# Patient Record
Sex: Female | Born: 1960 | Race: Black or African American | Hispanic: No | Marital: Married | Smoking: Never smoker
Health system: Southern US, Community
[De-identification: ages and names within clinical notes are randomized; demographics above are authoritative.]

## PROBLEM LIST (undated history)

## (undated) DIAGNOSIS — Z9889 Other specified postprocedural states: Secondary | ICD-10-CM

## (undated) DIAGNOSIS — I10 Essential (primary) hypertension: Secondary | ICD-10-CM

## (undated) DIAGNOSIS — C539 Malignant neoplasm of cervix uteri, unspecified: Secondary | ICD-10-CM

## (undated) DIAGNOSIS — Z803 Family history of malignant neoplasm of breast: Secondary | ICD-10-CM

## (undated) HISTORY — PX: CHOLECYSTECTOMY: SHX55

## (undated) HISTORY — DX: Malignant neoplasm of cervix uteri, unspecified: C53.9

## (undated) HISTORY — DX: Family history of malignant neoplasm of breast: Z80.3

---

## 2009-04-25 DIAGNOSIS — I10 Essential (primary) hypertension: Secondary | ICD-10-CM | POA: Insufficient documentation

## 2009-05-03 DIAGNOSIS — K219 Gastro-esophageal reflux disease without esophagitis: Secondary | ICD-10-CM | POA: Insufficient documentation

## 2015-11-03 ENCOUNTER — Other Ambulatory Visit (HOSPITAL_COMMUNITY)
Admission: RE | Admit: 2015-11-03 | Discharge: 2015-11-03 | Disposition: A | Discharge status: Home / Self Care | Payer: BLUE CROSS/BLUE SHIELD | Source: Ambulatory Visit | Attending: Family Medicine | Admitting: Family Medicine

## 2015-11-03 ENCOUNTER — Other Ambulatory Visit: Payer: Self-pay | Admitting: Family Medicine

## 2015-11-03 DIAGNOSIS — Z01419 Encounter for gynecological examination (general) (routine) without abnormal findings: Secondary | ICD-10-CM | POA: Insufficient documentation

## 2015-11-06 LAB — CYTOLOGY - PAP: Diagnosis: NEGATIVE

## 2016-10-24 ENCOUNTER — Ambulatory Visit (INDEPENDENT_AMBULATORY_CARE_PROVIDER_SITE_OTHER): Payer: BLUE CROSS/BLUE SHIELD

## 2016-10-24 ENCOUNTER — Ambulatory Visit (INDEPENDENT_AMBULATORY_CARE_PROVIDER_SITE_OTHER): Payer: BLUE CROSS/BLUE SHIELD | Admitting: Orthopaedic Surgery

## 2016-10-24 DIAGNOSIS — G8929 Other chronic pain: Secondary | ICD-10-CM

## 2016-10-24 DIAGNOSIS — M25561 Pain in right knee: Secondary | ICD-10-CM

## 2016-10-24 MED ORDER — DICLOFENAC SODIUM 1 % TD GEL: 2.0000 g | Freq: Four times a day (QID) | TRANSDERMAL | 5 refills | Status: DC

## 2016-10-24 NOTE — Progress Notes (Signed)
   Office Visit Note   Patient: Jasmine Maddox           Date of Birth: [birth year]           MRN: [id number] Visit Date: 10/24/2016              Requested by: [name], [address], [place] [id number] PCP: No primary care provider on file.   Assessment & Plan: Visit Diagnoses:  1. 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 Prescription for Voltaren gel. Oral NSAIDs as needed. Avoid deep squats and lunges. Follow-up as needed.  Follow-Up Instructions: Return if symptoms worsen or fail to improve.   Orders:  Orders Placed This Encounter  Procedures  . XR KNEE 3 VIEW RIGHT   Meds ordered this encounter  Medications  . diclofenac sodium (VOLTAREN) 1 % GEL    Sig: Apply 2 g topically 4 (four) times daily.    Dispense:  1 Tube    Refill:  5      Procedures: No procedures performed   Clinical Data: No additional findings.   Subjective: Chief Complaint  Patient presents with  . Right Knee - Pain    Patient is a 56 year old female comes in with right knee pain for a couple years. She is very active and used to be a Museum/gallery exhibitions officer. Her pain is on the lateral aspect of the patellar facet. This is worse with flexion of the knee and with deep squats. Denies any swelling or numbness and tingling. Denies any injuries.    Review of Systems  Constitutional: Negative.   HENT: Negative.   Eyes: Negative.   Respiratory: Negative.   Cardiovascular: Negative.   Endocrine: Negative.   Musculoskeletal: Negative.   Neurological: Negative.   Hematological: Negative.   Psychiatric/Behavioral: Negative.   All other systems reviewed and are negative.    Objective: Vital Signs: There were no vitals taken for this visit.  Physical Exam  Constitutional: She is oriented to person, place, and time. She appears well-developed and well-nourished.  HENT:  Head: Normocephalic and atraumatic.   Eyes: EOM are normal.  Neck: Neck supple.  Pulmonary/Chest: Effort normal.  Abdominal: Soft.  Neurological: She is alert and oriented to person, place, and time.  Skin: Skin is warm. Capillary refill takes less than 2 seconds.  Psychiatric: She has a normal mood and affect. Her behavior is normal. Judgment and thought content normal.  Nursing note and vitals reviewed.   Ortho Exam Right knee exam shows no joint effusion. Patellar tracking is normal. Collaterals and cruciates are stable. Patella mobility is normal. Specialty Comments:  No specialty comments available.  Imaging: Xr Knee 3 View Right  Result Date: 10/24/2016 No significant degenerative joint disease or structural abnormalities    PMFS History: There are no active problems to display for this patient.  No past medical history on file.  No family history on file.  No past surgical history on file. Social History   Occupational History  . Not on file.   Social History Main Topics  . Smoking status: Not on file  . Smokeless tobacco: Not on file  . Alcohol use Not on file  . Drug use: Unknown  . Sexual activity: Not on file

## 2018-01-17 ENCOUNTER — Encounter: Payer: Self-pay | Admitting: Emergency Medicine

## 2018-01-17 ENCOUNTER — Ambulatory Visit
Admission: EM | Admit: 2018-01-17 | Discharge: 2018-01-17 | Disposition: A | Discharge status: Home / Self Care | Payer: BLUE CROSS/BLUE SHIELD | Attending: Family Medicine | Admitting: Family Medicine

## 2018-01-17 ENCOUNTER — Other Ambulatory Visit: Payer: Self-pay

## 2018-01-17 DIAGNOSIS — B9789 Other viral agents as the cause of diseases classified elsewhere: Secondary | ICD-10-CM | POA: Diagnosis not present

## 2018-01-17 DIAGNOSIS — J988 Other specified respiratory disorders: Secondary | ICD-10-CM | POA: Diagnosis not present

## 2018-01-17 HISTORY — DX: Essential (primary) hypertension: I10

## 2018-01-17 LAB — RAPID STREP SCREEN (MED CTR MEBANE ONLY): Streptococcus, Group A Screen (Direct): NEGATIVE

## 2018-01-17 MED ORDER — PREDNISONE 50 MG PO TABS: ORAL_TABLET | ORAL | 0 refills | Status: DC

## 2018-01-17 MED ORDER — LIDOCAINE VISCOUS HCL 2 % MT SOLN: OROMUCOSAL | 0 refills | Status: DC

## 2018-01-17 MED ORDER — BENZONATATE 100 MG PO CAPS: 100.0000 mg | ORAL_CAPSULE | Freq: Three times a day (TID) | ORAL | 0 refills | Status: DC | PRN

## 2018-01-17 MED ORDER — HYDROCODONE-HOMATROPINE 5-1.5 MG/5ML PO SYRP: 5.0000 mL | ORAL_SOLUTION | Freq: Four times a day (QID) | ORAL | 0 refills | Status: DC | PRN

## 2018-01-17 NOTE — ED Provider Notes (Signed)
[facility]    CSN: [id number] Arrival date & time: 01/17/18  1122  History   Chief Complaint Chief Complaint  Patient presents with  . Sore Throat  . Cough   HPI  57 year old female presents with cough and sore throat.  4 to 5-day history of cough, sore throat.  Has had a mildly elevated temperature of 100.2.  This occurred on Thursday.  No elevated temperatures at that time.  She has taken Mucinex and Advil without resolution.  Reports that her daughter has been sick and she believes that she contracted it from her.  No known exacerbating factors.  No other associated symptoms.  No other complaints.  History reviewed and updated as below.  Past Medical History:  Diagnosis Date  . Hypertension    Past Surgical History:  Procedure Laterality Date  . CHOLECYSTECTOMY      OB History   No obstetric history on file.    Home Medications    Prior to Admission medications   Medication Sig Start Date End Date Taking? Authorizing Provider  lisinopril (PRINIVIL,ZESTRIL) 20 MG tablet Take 20 mg by mouth daily.   Yes [provider]  benzonatate (TESSALON) 100 MG capsule Take 1 capsule (100 mg total) by mouth 3 (three) times daily as needed. 01/17/18   Coral Spikes, DO  lidocaine (XYLOCAINE) 2 % solution Gargle 15 mL every 3 hours as needed. May swallow if desired. 01/17/18   Coral Spikes, DO   Social History Social History   Tobacco Use  . Smoking status: Never Smoker  . Smokeless tobacco: Never Used  Substance Use Topics  . Alcohol use: Yes    Comment: socially  . Drug use: Never     Allergies   Patient has no known allergies.   Review of Systems Review of Systems  Constitutional: Positive for fever.  HENT: Positive for sore throat.   Respiratory: Positive for cough.    Physical Exam Triage Vital Signs ED Triage Vitals  Enc Vitals Group     BP 01/17/18 1131 (!) 157/98     Pulse Rate 01/17/18 1131 75     Resp 01/17/18 1131 18   Temp 01/17/18 1131 98.4 F (36.9 C)     Temp Source 01/17/18 1131 Oral     SpO2 01/17/18 1131 99 %     Weight 01/17/18 1132 143 lb (64.9 kg)     Height 01/17/18 1132 5\' 4"  (1.626 m)     Head Circumference --      Peak Flow --      Pain Score 01/17/18 1131 6     Pain Loc --      Pain Edu? --      Excl. in [place]? --    Updated Vital Signs BP (!) 157/98 (BP Location: Left Arm)   Pulse 75   Temp 98.4 F (36.9 C) (Oral)   Resp 18   Ht 5\' 4"  (1.626 m)   Wt 64.9 kg   SpO2 99%   BMI 24.55 kg/m   Visual Acuity Right Eye Distance:   Left Eye Distance:   Bilateral Distance:    Right Eye Near:   Left Eye Near:    Bilateral Near:     Physical Exam Vitals signs and nursing note reviewed.  Constitutional:      General: She is not in acute distress. HENT:     Head: Normocephalic and atraumatic.     Mouth/Throat:     Comments: Mild Oropharyngeal erythema.  Cardiovascular:     Rate and Rhythm: Normal rate and regular rhythm.  Pulmonary:     Effort: Pulmonary effort is normal.     Breath sounds: No wheezing, rhonchi or rales.  Neurological:     Mental Status: She is alert.  Psychiatric:        Mood and Affect: Mood normal.        Behavior: Behavior normal.    UC Treatments / Results  Labs (all labs ordered are listed, but only abnormal results are displayed) Labs Reviewed  RAPID STREP SCREEN ([facility] ONLY)  CULTURE, GROUP A STREP [facility])    EKG None  Radiology No results found.  Procedures Procedures (including critical care time)  Medications Ordered in UC Medications - No data to display  Initial Impression / Assessment and Plan / UC Course  I have reviewed the triage vital signs and the nursing notes.  Pertinent labs & imaging results that were available during my care of the patient were reviewed by me and considered in my medical decision making (see chart for details).    57 year old female presents with viral respiratory infection.  Treating with  Tessalon Perles and viscous lidocaine.  Final Clinical Impressions(s) / UC Diagnoses   Final diagnoses:  Viral respiratory infection     Discharge Instructions     Medication as prescribed.  Take care  Dr. Lacinda Axon    ED Prescriptions    Medication Sig Dispense Auth. Provider   benzonatate (TESSALON) 100 MG capsule Take 1 capsule (100 mg total) by mouth 3 (three) times daily as needed. 30 capsule Chandra Asher G, DO   lidocaine (XYLOCAINE) 2 % solution Gargle 15 mL every 3 hours as needed. May swallow if desired. 200 mL Drayke Grabel G, DO   predniSONE (DELTASONE) 50 MG tablet  (Status: Discontinued) 1 tablet daily x 5 days 5 tablet Ruhan Borak G, DO   HYDROcodone-homatropine (HYCODAN) 5-1.5 MG/5ML syrup  (Status: Discontinued) Take 5 mLs by mouth every 6 (six) hours as needed. 120 mL Coral Spikes, DO     Controlled Substance Prescriptions [place] Controlled Substance Registry consulted? Not Applicable   Coral Spikes, DO 01/17/18 3335

## 2018-01-17 NOTE — Discharge Instructions (Signed)
Medication as prescribed.  Take care  Dr. Raffaella Edison  

## 2018-01-17 NOTE — ED Triage Notes (Signed)
Patient c/o cough and sore throat that started 4 days ago. Patient reports fever of 100.2 on Thursday but no fever since. Patient has taken Mucinex DM and Advil for her symptoms.

## 2018-01-20 LAB — CULTURE, GROUP A STREP (THRC)

## 2018-02-09 ENCOUNTER — Other Ambulatory Visit: Payer: Self-pay | Admitting: Radiology

## 2018-02-11 ENCOUNTER — Telehealth: Payer: Self-pay | Admitting: Hematology and Oncology

## 2018-02-11 NOTE — Telephone Encounter (Signed)
Spoke with patient to confirm afternoon [facility] appointment for 1/29, [facility] patient appointment reminder sent

## 2018-02-12 ENCOUNTER — Encounter: Payer: Self-pay | Admitting: *Deleted

## 2018-02-12 DIAGNOSIS — D0511 Intraductal carcinoma in situ of right breast: Secondary | ICD-10-CM | POA: Insufficient documentation

## 2018-02-17 NOTE — Progress Notes (Signed)
[place] CONSULT NOTE  Patient Care Team: [name], PA as PCP - General (Family Medicine) [name], MD as Consulting Physician (General Surgery) [name], MD as Consulting Physician (Hematology and Oncology) [name], MD as Consulting Physician (Radiation Oncology)  CHIEF COMPLAINTS/PURPOSE OF CONSULTATION: Newly diagnosed breast cancer  HISTORY OF PRESENTING ILLNESS:  Jasmine Maddox 58 y.o. female is here because of recent diagnosis of DCIS of the right breast. The cancer was detected on a routine screening mammogram on 01/26/18 that showed a 0.4cm cluster of calcifications in the right breast. The cancer was not palpable prior to diagnosis. A diagnostic mammogram from 01/30/18 showed the 0.4cm cluster at the 12 o'clock position 4cm from the nipple. 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   She presents to the clinic today with her husband. She has a family history of breast cancer in her mother at 35 and sister at 48 and she reports her sister had negative genetic testing.   I reviewed her records extensively and collaborated the history with the patient.  SUMMARY OF ONCOLOGIC HISTORY:   Ductal carcinoma in situ (DCIS) of right breast   01/30/2018 Mammogram    Diagnostic Mammogram  0.4cm cluster of calcifications, 12 o'clock position, 4cm fn, right breast    02/12/2018 Initial Diagnosis    Screening detected right breast calcifications 4 mm size UOQ 11:30 position stereotactic biopsy revealed high-grade DCIS ER 90%, PR 10%, Tis NX stage 0    MEDICAL HISTORY:  Past Medical History:  Diagnosis Date  . Cervical cancer ([place])   . Hypertension    SURGICAL HISTORY: Past Surgical History:  Procedure Laterality Date  . CHOLECYSTECTOMY      SOCIAL HISTORY: Social History   Socioeconomic History  . Marital status: Unknown    Spouse name: Not on file  . Number of  children: Not on file  . Years of education: Not on file  . Highest education level: Not on file  Occupational History  . Not on file  Social Needs  . Financial resource strain: Not on file  . Food insecurity:    Worry: Not on file    Inability: Not on file  . Transportation needs:    Medical: Not on file    Non-medical: Not on file  Tobacco Use  . Smoking status: Never Smoker  . Smokeless tobacco: Never Used  Substance and Sexual Activity  . Alcohol use: Yes    Comment: socially  . Drug use: Never  . Sexual activity: Not on file  Lifestyle  . Physical activity:    Days per week: Not on file    Minutes per session: Not on file  . Stress: Not on file  Relationships  . Social connections:    Talks on phone: Not on file    Gets together: Not on file    Attends religious service: Not on file    Active member of club or organization: Not on file    Attends meetings of clubs or organizations: Not on file    Relationship status: Not on file  . Intimate partner violence:    Fear of current or ex partner: Not on file    Emotionally abused: Not on file    Physically abused: Not on file    Forced sexual activity: Not on file  Other Topics Concern  . Not on file  Social History Narrative  .  Not on file    FAMILY HISTORY: Family History  Problem Relation Age of Onset  . Breast cancer Mother   . Breast cancer Sister     ALLERGIES:  has No Known Allergies.  MEDICATIONS:  Current Outpatient Medications  Medication Sig Dispense Refill  . benzonatate (TESSALON) 100 MG capsule Take 1 capsule (100 mg total) by mouth 3 (three) times daily as needed. 30 capsule 0  . lidocaine (XYLOCAINE) 2 % solution Gargle 15 mL every 3 hours as needed. May swallow if desired. 200 mL 0  . lisinopril (PRINIVIL,ZESTRIL) 20 MG tablet Take 20 mg by mouth daily.     No current facility-administered medications for this visit.     REVIEW OF SYSTEMS:   Constitutional: Denies fevers, chills or  abnormal night sweats Eyes: Denies blurriness of vision, double vision or watery eyes Ears, nose, mouth, throat, and face: Denies mucositis or sore throat Respiratory: Denies cough, dyspnea or wheezes Cardiovascular: Denies palpitation, chest discomfort or lower extremity swelling Gastrointestinal:  Denies nausea, heartburn or change in bowel habits Skin: Denies abnormal skin rashes Lymphatics: Denies new lymphadenopathy or easy bruising Neurological:Denies numbness, tingling or new weaknesses Behavioral/Psych: Mood is stable, no new changes  Breast: Denies any palpable lumps or discharge All other systems were reviewed with the patient and are negative.  PHYSICAL EXAMINATION: ECOG PERFORMANCE STATUS: 0 - Asymptomatic  Vitals:   02/18/18 1321  BP: (!) 154/87  Pulse: (!) 56  Resp: 18  Temp: 98.6 F (37 C)  SpO2: 100%   Filed Weights   02/18/18 1321  Weight: 137 lb (62.1 kg)    GENERAL:alert, no distress and comfortable SKIN: skin color, texture, turgor are normal, no rashes or significant lesions EYES: normal, conjunctiva are pink and non-injected, sclera clear OROPHARYNX:no exudate, no erythema and lips, buccal mucosa, and tongue normal  NECK: supple, thyroid normal size, non-tender, without nodularity LYMPH:  no palpable lymphadenopathy in the cervical, axillary or inguinal LUNGS: clear to auscultation and percussion with normal breathing effort HEART: regular rate & rhythm and no murmurs and no lower extremity edema ABDOMEN:abdomen soft, non-tender and normal bowel sounds Musculoskeletal:no cyanosis of digits and no clubbing  PSYCH: alert & oriented x 3 with fluent speech NEURO: no focal motor/sensory deficits  BREAST: No palpable nodules in breast. No palpable axillary or supraclavicular lymphadenopathy (exam performed in the presence of a chaperone)   LABORATORY DATA:  I have reviewed the data as listed Lab Results  Component Value Date   WBC 4.7 02/18/2018   HGB  12.5 02/18/2018   HCT 37.2 02/18/2018   MCV 88.6 02/18/2018   PLT 183 02/18/2018   Lab Results  Component Value Date   NA 140 02/18/2018   K 4.0 02/18/2018   CL 106 02/18/2018   CO2 27 02/18/2018    RADIOGRAPHIC STUDIES: I have personally reviewed the radiological reports and agreed with the findings in the report.  ASSESSMENT AND PLAN:  Ductal carcinoma in situ (DCIS) of right breast 02/09/2018:Screening detected right breast calcifications 4 mm size UOQ 11:30 position stereotactic biopsy revealed high-grade DCIS ER 90%, PR 10%, Tis NX stage 0  Pathology review: I discussed with the patient the difference between DCIS and invasive breast cancer. It is considered a precancerous lesion. DCIS is classified as a 0. It is generally detected through mammograms as calcifications. We discussed the significance of grades and its impact on prognosis. We also discussed the importance of ER and PR receptors and their implications to adjuvant treatment  options. Prognosis of DCIS dependence on grade, comedo necrosis. It is anticipated that if not treated, 20-30% of DCIS can develop into invasive breast cancer.  Recommendation: 1. Breast conserving surgery 2. Followed by adjuvant radiation therapy 3. Followed by antiestrogen therapy with tamoxifen 5 years  Tamoxifen counseling: We discussed the risks and benefits of tamoxifen. These include but not limited to insomnia, hot flashes, mood changes, vaginal dryness, and weight gain. Although rare, serious side effects including endometrial cancer, risk of blood clots were also discussed. We strongly believe that the benefits far outweigh the risks. Patient understands these risks and consented to starting treatment. Planned treatment duration is 5 years.  Return to clinic after surgery to discuss the final pathology report and come up with an adjuvant treatment plan.    All questions were answered. The patient knows to call the clinic with any  problems, questions or concerns.   [name], MD 02/18/2018   I, Cloyde Reams Dorshimer, am acting as scribe for [name], MD.  I have reviewed the above documentation for accuracy and completeness, and I agree with the above.

## 2018-02-18 ENCOUNTER — Ambulatory Visit (HOSPITAL_BASED_OUTPATIENT_CLINIC_OR_DEPARTMENT_OTHER): Payer: BLUE CROSS/BLUE SHIELD | Admitting: Licensed Clinical Social Worker

## 2018-02-18 ENCOUNTER — Encounter: Payer: Self-pay | Admitting: Hematology and Oncology

## 2018-02-18 ENCOUNTER — Ambulatory Visit: Payer: BLUE CROSS/BLUE SHIELD | Admitting: Physical Therapy

## 2018-02-18 ENCOUNTER — Ambulatory Visit
Admission: RE | Admit: 2018-02-18 | Discharge: 2018-02-18 | Disposition: A | Discharge status: Home / Self Care | Payer: BLUE CROSS/BLUE SHIELD | Source: Ambulatory Visit | Attending: Radiation Oncology | Admitting: Radiation Oncology

## 2018-02-18 ENCOUNTER — Inpatient Hospital Stay: Payer: BLUE CROSS/BLUE SHIELD

## 2018-02-18 ENCOUNTER — Encounter: Payer: Self-pay | Admitting: Licensed Clinical Social Worker

## 2018-02-18 ENCOUNTER — Inpatient Hospital Stay: Payer: BLUE CROSS/BLUE SHIELD | Attending: Hematology and Oncology | Admitting: Hematology and Oncology

## 2018-02-18 DIAGNOSIS — Z803 Family history of malignant neoplasm of breast: Secondary | ICD-10-CM

## 2018-02-18 DIAGNOSIS — D0511 Intraductal carcinoma in situ of right breast: Secondary | ICD-10-CM | POA: Diagnosis present

## 2018-02-18 LAB — CMP (CANCER CENTER ONLY)
ALT: 32 U/L (ref 0–44)
AST: 28 U/L (ref 15–41)
Albumin: 4 g/dL (ref 3.5–5.0)
Alkaline Phosphatase: 64 U/L (ref 38–126)
Anion gap: 7 (ref 5–15)
BUN: 10 mg/dL (ref 6–20)
CO2: 27 mmol/L (ref 22–32)
Calcium: 9.5 mg/dL (ref 8.9–10.3)
Chloride: 106 mmol/L (ref 98–111)
Creatinine: 0.77 mg/dL (ref 0.44–1.00)
GFR, Est AFR Am: >60 mL/min (ref >60)
GFR, Estimated: >60 mL/min (ref >60)
Glucose, Bld: 94 mg/dL (ref 70–99)
Potassium: 4 mmol/L (ref 3.5–5.1)
SODIUM: 140 mmol/L (ref 135–145)
Total Bilirubin: 0.4 mg/dL (ref 0.3–1.2)
Total Protein: 7.7 g/dL (ref 6.5–8.1)

## 2018-02-18 LAB — CBC WITH DIFFERENTIAL (CANCER CENTER ONLY)
Abs Immature Granulocytes: 0 10*3/uL (ref 0.00–0.07)
Basophils Absolute: 0 10*3/uL (ref 0.0–0.1)
Basophils Relative: 1 %
Eosinophils Absolute: 0.1 10*3/uL (ref 0.0–0.5)
Eosinophils Relative: 3 %
HCT: 37.2 % (ref 36.0–46.0)
Hemoglobin: 12.5 g/dL (ref 12.0–15.0)
Immature Granulocytes: 0 %
Lymphocytes Relative: 48 %
Lymphs Abs: 2.3 10*3/uL (ref 0.7–4.0)
MCH: 29.8 pg (ref 26.0–34.0)
MCHC: 33.6 g/dL (ref 30.0–36.0)
MCV: 88.6 fL (ref 80.0–100.0)
Monocytes Absolute: 0.3 10*3/uL (ref 0.1–1.0)
Monocytes Relative: 6 %
Neutro Abs: 2 10*3/uL (ref 1.7–7.7)
Neutrophils Relative %: 42 %
Platelet Count: 183 10*3/uL (ref 150–400)
RBC: 4.2 MIL/uL (ref 3.87–5.11)
RDW: 11.9 % (ref 11.5–15.5)
WBC Count: 4.7 10*3/uL (ref 4.0–10.5)
nRBC: 0 % (ref 0.0–0.2)

## 2018-02-18 NOTE — Assessment & Plan Note (Signed)
02/09/2018:Screening detected right breast calcifications 4 mm size UOQ 11:30 position stereotactic biopsy revealed high-grade DCIS ER 90%, PR 10%, Tis NX stage 0  Pathology review: I discussed with the patient the difference between DCIS and invasive breast cancer. It is considered a precancerous lesion. DCIS is classified as a 0. It is generally detected through mammograms as calcifications. We discussed the significance of grades and its impact on prognosis. We also discussed the importance of ER and PR receptors and their implications to adjuvant treatment options. Prognosis of DCIS dependence on grade, comedo necrosis. It is anticipated that if not treated, 20-30% of DCIS can develop into invasive breast cancer.  Recommendation: 1. Breast conserving surgery 2. Followed by adjuvant radiation therapy 3. Followed by antiestrogen therapy with tamoxifen 5 years  Tamoxifen counseling: We discussed the risks and benefits of tamoxifen. These include but not limited to insomnia, hot flashes, mood changes, vaginal dryness, and weight gain. Although rare, serious side effects including endometrial cancer, risk of blood clots were also discussed. We strongly believe that the benefits far outweigh the risks. Patient understands these risks and consented to starting treatment. Planned treatment duration is 5 years.  Return to clinic after surgery to discuss the final pathology report and come up with an adjuvant treatment plan.

## 2018-02-18 NOTE — Progress Notes (Signed)
Radiation Oncology         ([phone]) [phone] ________________________________  Multidisciplinary Breast Oncology Clinic [facility]) Initial Outpatient Consultation  Name: Jasmine Maddox MRN: [id number]  Date: 02/18/2018  DOB: 1960-03-08  QM:[id number], [place], [place]  [name], MD   REFERRING PHYSICIAN: [name], MD  DIAGNOSIS: The encounter diagnosis was Ductal carcinoma in situ (DCIS) of right breast.  Stage 0 right Breast UOQ  Ductal Carcinoma in Situ, ER+ / PR+ High grade    ICD-10-CM   1. Ductal carcinoma in situ (DCIS) of right breast D05.11     HISTORY OF PRESENT ILLNESS::Jasmine Maddox is a 58 y.o. female who is presenting to the office today for evaluation of her newly diagnosed breast cancer. She is accompanied by her husband. She is doing well overall.   She had routine screening mammography on January 6 showing a possible abnormality in the right breast. She underwent bilateral diagnostic mammography with tomography and right breast ultrasonography at [facility] on January 10 showing: a 0.4 cm area of both linea layering and heterogenous calcifications in the right breast are indeterminate and possibly associated with a mass. The ultrasound showed grouped microcalcifications on the right that were suspicious for malignancy  Biopsy on January 20 showed: ductal carcinoma in situ with calcifications in the 11:30 o'clock. Prognostic indicators significant for: estrogen receptor, 90% positive and progesterone receptor, 10% positive, both with strong staining intensity.   Menarche: 58 years old Age at first live birth: 58 years old GP: GxP2 LMP: 2008 Contraceptive: yes, from 2000-2008 HRT: no   The patient was referred today for presentation in the multidisciplinary conference.  Radiology studies and pathology slides were presented there for review and discussion of treatment options.  A consensus was discussed regarding potential next steps.  PREVIOUS RADIATION THERAPY: No  PAST  MEDICAL HISTORY:  has a past medical history of Cervical cancer ([place]), Family history of breast cancer, and Hypertension.    PAST SURGICAL HISTORY: Past Surgical History:  Procedure Laterality Date  . CHOLECYSTECTOMY      FAMILY HISTORY: family history includes Breast cancer (age of onset: 36) in her mother; Breast cancer (age of onset: 44) in her sister.  SOCIAL HISTORY:  reports that she has never smoked. She has never used smokeless tobacco. She reports current alcohol use. She reports that she does not use drugs.  ALLERGIES: Patient has no known allergies.  MEDICATIONS:  Current Outpatient Medications  Medication Sig Dispense Refill  . benzonatate (TESSALON) 100 MG capsule Take 1 capsule (100 mg total) by mouth 3 (three) times daily as needed. (Patient not taking: Reported on 02/18/2018) 30 capsule 0  . lidocaine (XYLOCAINE) 2 % solution Gargle 15 mL every 3 hours as needed. May swallow if desired. (Patient not taking: Reported on 02/18/2018) 200 mL 0  . lisinopril (PRINIVIL,ZESTRIL) 20 MG tablet Take 20 mg by mouth daily.     No current facility-administered medications for this encounter.     REVIEW OF SYSTEMS:  REVIEW OF SYSTEMS: A 10+ POINT REVIEW OF SYSTEMS WAS OBTAINED including neurology, dermatology, psychiatry, cardiac, respiratory, lymph, extremities, GI, GU, musculoskeletal, constitutional, reproductive, HEENT.On the provided form, she reports hot flashes. She denies  any other symptoms.    PHYSICAL EXAM:  Vitals with BMI 01/17/2018  Height 5\' 4"   Weight 143 lbs  BMI 95.09  Systolic 326  Diastolic 98  Pulse 75  Respirations 18   Lungs are clear to auscultation bilaterally. Heart has regular rate and rhythm. No palpable cervical, supraclavicular,  or axillary adenopathy. Abdomen soft, non-tender, normal bowel sounds. Breast: Left breast with no palpable mass, nipple discharge, or bleeding. Right breast with small biopsy site in the upper aspect of the breast. No  palpable mass, nipple discharge or bleeding.   ECOG = 0  0 - Asymptomatic (Fully active, able to carry on all predisease activities without restriction)  1 - Symptomatic but completely ambulatory (Restricted in physically strenuous activity but ambulatory and able to carry out work of a light or sedentary nature. For example, light housework, office work)  2 - Symptomatic, <50% in bed during the day (Ambulatory and capable of all self care but unable to carry out any work activities. Up and about more than 50% of waking hours)  3 - Symptomatic, >50% in bed, but not bedbound (Capable of only limited self-care, confined to bed or chair 50% or more of waking hours)  4 - Bedbound (Completely disabled. Cannot carry on any self-care. Totally confined to bed or chair)  5 - Death   Eustace Pen MM, Creech RH, Tormey DC, et al. [phone]). "Toxicity and response criteria of the [facility]". [place] Oncol. 5 (6): 649-55  LABORATORY DATA:  Lab Results  Component Value Date   WBC 4.7 02/18/2018   HGB 12.5 02/18/2018   HCT 37.2 02/18/2018   MCV 88.6 02/18/2018   PLT 183 02/18/2018   Lab Results  Component Value Date   NA 140 02/18/2018   K 4.0 02/18/2018   CL 106 02/18/2018   CO2 27 02/18/2018   Lab Results  Component Value Date   ALT 32 02/18/2018   AST 28 02/18/2018   ALKPHOS 64 02/18/2018   BILITOT 0.4 02/18/2018    PULMONARY FUNCTION TEST:   Recent Review Flowsheet Data    There is no flowsheet data to display.      RADIOGRAPHY: No results found.    IMPRESSION: High grade intraductal carcinoma of the right breast.   Patient will be a good candidate for breast conservation therapy. She does not wish to consider a mastectomy for this early stage of disease. I would recommend radiation therapy as part of her treatment given her young age and high grade lesion. Today, I talked to the patient and her husband about the findings and work-up thus far.  We discussed  the natural history of breast cancer and general treatment, highlighting the role of radiotherapy in the management.  We discussed the available radiation techniques, and focused on the details of logistics and delivery.  We reviewed the anticipated acute and late sequelae associated with radiation in this setting.  The patient was encouraged to ask questions that I answered to the best of my ability.  A patient consent form was discussed and signed.  We retained a copy for our records.  The patient would like to proceed with radiation and will be scheduled for CT simulation.   PLAN:  1. Genetics 2. Right lumpectomy 3. External radiation therapy 4. AI   ------------------------------------------------  Blair Promise, PhD, MD This document serves as a record of services personally performed by [name], MD. It was created on his behalf by Mary-Margaret Loma Messing, a trained medical scribe. The creation of this record is based on the scribe's personal observations and the provider's statements to them. This document has been checked and approved by the attending provider.

## 2018-02-18 NOTE — Progress Notes (Signed)
Nutrition Assessment  Reason for Assessment:  Pt seen in Breast Clinic  ASSESSMENT:   58 year old female with new diagnosis of breast cancer.  Past medical history of HTN.    Patient reports good normal appetite  Medications:  reviewed  Labs: reviewed  Anthropometrics:   Height: 63 inches Weight: 137 lb BMI: 24   NUTRITION DIAGNOSIS: Food and nutrition related knowledge deficit related to new diagnosis of breast cancer as evidenced by no prior need for nutrition related information.  INTERVENTION:   Discussed and provided packet of information regarding nutritional tips for breast cancer patients.  Questions answered.  Teachback method used.  Contact information provided and patient knows to contact me with questions/concerns.    MONITORING, EVALUATION, and GOAL: Pt will consume a healthy plant based diet to maintain lean body mass throughout treatment.   Raji Glinski B. Zenia Resides, [place], [place] Registered Dietitian [phone] (pager)

## 2018-02-18 NOTE — Progress Notes (Signed)
REFERRING PROVIDER: [name], MD [address] [place], [place] [address]  PRIMARY PROVIDER:  [name], PA  PRIMARY REASON FOR VISIT:  1. Ductal carcinoma in situ (DCIS) of right breast   2. Family history of breast cancer      HISTORY OF PRESENT ILLNESS:   Jasmine Maddox, a 58 y.o. female, was seen for a [facility] cancer genetics consultation due to her recent diagnosis of breast cancer and family history of breast cancer. Jasmine Maddox presents to clinic today to discuss the possibility of a hereditary predisposition to cancer, genetic testing, and to further clarify her future cancer risks, as well as potential cancer risks for family members.   In 2020, at the age of 64, Jasmine Maddox was diagnosed with right breast cancer, DCIS, ER+, PR+. Currently, lumpectomy is planned followed by radiation and antiestrogen therapy.  CANCER HISTORY:    Ductal carcinoma in situ (DCIS) of right breast   01/30/2018 Mammogram    Diagnostic Mammogram  0.4cm cluster of calcifications, 12 o'clock position, 4cm fn, right breast    02/12/2018 Initial Diagnosis    Screening detected right breast calcifications 4 mm size UOQ 11:30 position stereotactic biopsy revealed high-grade DCIS ER 90%, PR 10%, Tis NX stage 0      HORMONAL RISK FACTORS:  Menarche was at age 42.  First live birth at age 39.  OCP use for approximately 8 years.  Ovaries intact: yes.  Hysterectomy: no.  Menopausal status: postmenopausal.  HRT use: 0 years. Colonoscopy: yes; normal.   Past Medical History:  Diagnosis Date  . Cervical cancer ([place])   . Family history of breast cancer   . Hypertension     Past Surgical History:  Procedure Laterality Date  . CHOLECYSTECTOMY      Social History   Socioeconomic History  . Marital status: Unknown    Spouse name: Not on file  . Number of children: Not on file  . Years of education: Not on file  . Highest education level: Not on file  Occupational  History  . Not on file  Social Needs  . Financial resource strain: Not on file  . Food insecurity:    Worry: Not on file    Inability: Not on file  . Transportation needs:    Medical: Not on file    Non-medical: Not on file  Tobacco Use  . Smoking status: Never Smoker  . Smokeless tobacco: Never Used  Substance and Sexual Activity  . Alcohol use: Yes    Comment: socially  . Drug use: Never  . Sexual activity: Not on file  Lifestyle  . Physical activity:    Days per week: Not on file    Minutes per session: Not on file  . Stress: Not on file  Relationships  . Social connections:    Talks on phone: Not on file    Gets together: Not on file    Attends religious service: Not on file    Active member of club or organization: Not on file    Attends meetings of clubs or organizations: Not on file    Relationship status: Not on file  Other Topics Concern  . Not on file  Social History Narrative  . Not on file     FAMILY HISTORY:  We obtained a detailed, 4-generation family history.  Significant diagnoses are listed below: Family History  Problem Relation Age of Onset  . Breast cancer Mother 76  d. 14  . Breast cancer Sister 13       triple negative, "negative genetic testing 2019"    Jasmine Maddox has two daughters, one age 59, the other age 45. She has a sister, age 61, who was diagnosed with triple negative breast cancer at 45 and reportedly had negative genetic testing. Report not available for review during the session. The patient has another sister, age 75 who is healthy. She also has a half-brother through her mother who is 38 and healthy. No cancers in her nieces/nephews.   Jasmine Maddox's mother had breast cancer at 10 and died at 28. She had one sister and one brother, no cancers. No cancers in the patient's maternal cousins. Her maternal grandparents both passed away in their 17s.  Jasmine Maddox's father is living at 19. He has two sisters who are both still  living. One has had cancer, but the patient is unsure of the type. No cancers in paternal cousins. Her paternal grandparents both passed away in their 49s.  Jasmine Maddox is aware of previous family history of genetic testing for hereditary cancer risks. Patient's maternal ancestors are of African American descent, and paternal ancestors are of African American descent. There is no reported Ashkenazi Jewish ancestry. There is no known consanguinity.  GENETIC COUNSELING ASSESSMENT: Jasmine Maddox is a 58 y.o. female with a personal and family history of breast cancer which is somewhat suggestive of a Hereditary Cancer Predisposition Syndrome. We, therefore, discussed and recommended the following at today's visit.   DISCUSSION: We discussed that about 5-10% of breast cancer cases are hereditary with most cases due to BRCA mutations. We reviewed the characteristics, features and inheritance patterns of hereditary cancer syndromes. We also discussed genetic testing, including the appropriate family members to test, the process of testing, insurance coverage and turn-around-time for results. We discussed the implications of a negative, positive and/or variant of uncertain significant result. We recommended [name] pursue genetic testing for the [facility] Breast Cancer STAT Panel + Common Hereditary Cancers Panel.  The STAT Breast cancer panel offered by [facility] includes sequencing and rearrangement analysis for the following 9 genes:  ATM, BRCA1, BRCA2, CDH1, CHEK2, PALB2, PTEN, STK11 and TP53.    The Common Hereditary Cancers Panel offered by [facility] includes sequencing and/or deletion duplication testing of the following 47 genes: APC, ATM, AXIN2, BARD1, BMPR1A, BRCA1, BRCA2, BRIP1, CDH1, CDKN2A (p14ARF), CDKN2A (p16INK4a), CKD4, CHEK2, CTNNA1, DICER1, EPCAM (Deletion/duplication testing only), GREM1 (promoter region deletion/duplication testing only), KIT, MEN1, MLH1, MSH2, MSH3, MSH6, MUTYH, NBN, NF1,  NHTL1, PALB2, PDGFRA, PMS2, POLD1, POLE, PTEN, RAD50, RAD51C, RAD51D, SDHB, SDHC, SDHD, SMAD4, SMARCA4. STK11, TP53, TSC1, TSC2, and VHL.  The following genes were evaluated for sequence changes only: SDHA and HOXB13 c.251G>A variant only.  We discussed that if she is found to have a mutation in one of these genes, it may impact surgical decisions, and alter future medical management recommendations such as increased cancer screenings and consideration of risk reducing surgeries.  A positive result could also have implications for the patient's family members.  A Negative result would mean we were unable to identify a hereditary component to her cancer, but does not rule out the possibility of a hereditary basis for her cancer.  There could be mutations that are undetectable by current technology, or in genes not yet tested or identified to increase cancer risk.    We discussed the potential to find a Variant of Uncertain Significance or VUS.  These are variants  that have not yet been identified as pathogenic or benign, and it is unknown if this variant is associated with increased cancer risk or if this is a normal finding.  Most VUS's are reclassified to benign or likely benign.   It should not be used to make medical management decisions. With time, we suspect the lab will determine the significance of any VUS's identified if any.   Based on Ms. Agustin's personal and family history of cancer, she meets NCCN medical criteria for genetic testing. Despite that she meets criteria, she may still have an out of pocket cost. The lab will notify her of an OOP if any.  PLAN: After considering the risks, benefits, and limitations, Ms. Erb  provided informed consent to pursue genetic testing and the blood sample was sent to [facility] for analysis of the STAT Breast Cancer Panel + Common Hereditary Cancers Panel. Initial results should be available within approximately 5-12 days' time, at which  point they will be disclosed by telephone to Ms. Dimaggio, as will any additional recommendations warranted by these results. Ms. Revak will receive a summary of her genetic counseling visit and a copy of her results once available. This information will also be available in Epic.   Ms.  Tschetter questions were answered to her satisfaction today. Our contact information was provided should additional questions or concerns arise. Thank you for the referral and allowing [place] to share in the care of your patient.   Faith Rogue, MS Genetic Counselor [email or web address] Phone: [phone]   The patient was seen for a total of 15 minutes in face-to-face genetic counseling.  The patient was accompanied today by her husband Segreol.

## 2018-02-18 NOTE — Progress Notes (Signed)
[facility] [place] Psychosocial Distress Screening Spiritual Care  Met with Jasmine Maddox in Breast Multidisciplinary Clinic to introduce [name] team/resources, reviewing distress screen per protocol.  The patient scored a 5 on the Psychosocial Distress Thermometer which indicates moderate distress. Also assessed for distress and other psychosocial needs.   ONCBCN DISTRESS SCREENING 02/18/2018  Screening Type Initial Screening  Distress experienced in past week (1-10) 5  Emotional problem type Nervousness/Anxiety  Information Concerns Type Lack of info about diagnosis;Lack of info about treatment  Referral to support programs Yes   The pt presented to Breast Clinic with her husband. The pt reported that her distress is now at a 2 because of additional information about her diagnosis and treatment providing some relief. The pt shared that she feels supported by her husband, children, and sister who has gone through breast cancer treatment. The pt expressed that she is not currently interested in support programs, but she reported that she will reach out if any needs arise.  Follow up needed: No.  [name], Counseling Intern [phone]

## 2018-02-25 ENCOUNTER — Encounter: Payer: Self-pay | Admitting: Licensed Clinical Social Worker

## 2018-02-25 ENCOUNTER — Ambulatory Visit: Payer: Self-pay | Admitting: Licensed Clinical Social Worker

## 2018-02-25 ENCOUNTER — Telehealth: Payer: Self-pay | Admitting: Licensed Clinical Social Worker

## 2018-02-25 DIAGNOSIS — Z1379 Encounter for other screening for genetic and chromosomal anomalies: Secondary | ICD-10-CM | POA: Insufficient documentation

## 2018-02-25 NOTE — Progress Notes (Signed)
HPI:  Jasmine Maddox was previously seen in the [facility] on 02/18/2018 due to her recent breast cancer diagnosis, family history of cancer, and concerns regarding a hereditary predisposition to cancer. Please refer to our prior cancer genetics clinic note for more information regarding Jasmine Maddox's medical, social and family histories, and our assessment and recommendations, at the time. Jasmine Maddox recent genetic test results were disclosed to her, as well as recommendations warranted by these results. These results and recommendations are discussed in more detail below.  CANCER HISTORY:    Ductal carcinoma in situ (DCIS) of right breast   01/30/2018 Mammogram    Diagnostic Mammogram  0.4cm cluster of calcifications, 12 o'clock position, 4cm fn, right breast    02/12/2018 Initial Diagnosis    Screening detected right breast calcifications 4 mm size UOQ 11:30 position stereotactic biopsy revealed high-grade DCIS ER 90%, PR 10%, Tis NX stage 0      FAMILY HISTORY:  We obtained a detailed, 4-generation family history.  Significant diagnoses are listed below: Family History  Problem Relation Age of Onset  . Breast cancer Mother 1       d. 82  . Breast cancer Sister 77       triple negative, "negative genetic testing 2019"   Jasmine Maddox has two daughters, one age 59, the other age 20. She has a sister, age 60, who was diagnosed with triple negative breast cancer at 78 and reportedly had negative genetic testing. Report not available for review during the session. The patient has another sister, age 96 who is healthy. She also has a half-brother through her mother who is 101 and healthy. No cancers in her nieces/nephews.   Jasmine Maddox's mother had breast cancer at 80 and died at 36. She had one sister and one brother, no cancers. No cancers in the patient's maternal cousins. Her maternal grandparents both passed away in their 63s.  Jasmine Maddox's father is living  at 71. He has two sisters who are both still living. One has had cancer, but the patient is unsure of the type. No cancers in paternal cousins. Her paternal grandparents both passed away in their 17s.  Jasmine Maddox is aware of previous family history of genetic testing for hereditary cancer risks. Patient's maternal ancestors are of African American descent, and paternal ancestors are of African American descent. There is no reported Ashkenazi Jewish ancestry. There is no known consanguinity.  GENETIC TEST RESULTS: Genetic testing performed through [facility]'s Breast Cancer STAT Panel + Common Hereditary Cancers Panel reported out on 02/25/2018 showed no pathogenic mutations.  The STAT Breast cancer panel offered by [facility] includes sequencing and rearrangement analysis for the following 9 genes:  ATM, BRCA1, BRCA2, CDH1, CHEK2, PALB2, PTEN, STK11 and TP53.    The Common Hereditary Cancers Panel offered by [facility] includes sequencing and/or deletion duplication testing of the following 47 genes: APC, ATM, AXIN2, BARD1, BMPR1A, BRCA1, BRCA2, BRIP1, CDH1, CDKN2A (p14ARF), CDKN2A (p16INK4a), CKD4, CHEK2, CTNNA1, DICER1, EPCAM (Deletion/duplication testing only), GREM1 (promoter region deletion/duplication testing only), KIT, MEN1, MLH1, MSH2, MSH3, MSH6, MUTYH, NBN, NF1, NHTL1, PALB2, PDGFRA, PMS2, POLD1, POLE, PTEN, RAD50, RAD51C, RAD51D, SDHB, SDHC, SDHD, SMAD4, SMARCA4. STK11, TP53, TSC1, TSC2, and VHL.  The following genes were evaluated for sequence changes only: SDHA and HOXB13 c.251G>A variant only.  The test report will be scanned into EPIC and will be located under the Molecular Pathology section of the Results Review tab. A portion of the result report is  included below for reference.     We discussed with Jasmine Maddox that because current genetic testing is not perfect, it is possible there may be a gene mutation in one of these genes that current testing cannot detect, but that chance is small.   We also discussed, that there could be another gene that has not yet been discovered, or that we have not yet tested, that is responsible for the cancer diagnoses in the family. It is also possible there is a hereditary cause for the cancer in the family that Jasmine Maddox did not inherit and therefore was not identified in her testing.  Therefore, it is important to remain in touch with cancer genetics in the future so that we can continue to offer Jasmine Maddox the most up to date genetic testing.   ADDITIONAL GENETIC TESTING: We discussed with Jasmine Maddox that her genetic testing was fairly extensive.  If there are are genes identified to increase cancer risk that can be analyzed in the future, we would be happy to discuss and coordinate this testing at that time.    CANCER SCREENING RECOMMENDATIONS: Jasmine Maddox's test result is considered negative (normal).  This means that we have not identified a hereditary cause for her personal and family history of cancer at this time.   While reassuring, this does not definitively rule out a hereditary predisposition to cancer. It is still possible that there could be genetic mutations that are undetectable by current technology, or genetic mutations in genes that have not been tested or identified to increase cancer risk.  Therefore, it is recommended she continue to follow the cancer management and screening guidelines provided by her oncology and primary healthcare provider. An individual's cancer risk is not determined by genetic test results alone.  Overall cancer risk assessment includes additional factors such as personal medical history, family history, etc.  These should be used to make a personalized plan for cancer prevention and surveillance.    Given Jasmine Maddox's personal and family histories, we must consider these negative results carefully.  Families with features suggestive of hereditary risk for cancer tend to have multiple family members with  cancer, diagnoses in multiple generations, and diagnoses before the age of 30. Jasmine Maddox's family exhibits some of these features. Therefore this negative result may actually be due to the limitations of current technology to detect all mutations within these genes, or there may be a different gene that has not yet been discovered or tested.   RECOMMENDATIONS FOR FAMILY MEMBERS:  Relatives in this family might be at some increased risk of developing cancer, over the general population risk, simply due to the family history of cancer.  We recommended women in this family have a yearly mammogram beginning at age 80, or 44 years younger than the earliest onset of cancer, an annual clinical breast exam, and perform monthly breast self-exams. Women in this family should also have a gynecological exam as recommended by their primary provider. All family members should have a colonoscopy as directed by their doctors.  All family members should inform their physicians about the family history of cancer so their doctors can make the most appropriate screening recommendations for them.   It is also possible there is a hereditary cause for the cancer in Jasmine Maddox's family that she did not inherit and therefore was not identified in her. Her maternal relatives could consider genetic counseling and testing.   FOLLOW-UP: Lastly, we discussed with Jasmine Maddox that cancer genetics  is a rapidly advancing field and it is possible that new genetic tests will be appropriate for her and/or her family members in the future. We encouraged her to remain in contact with cancer genetics on an annual basis so we can update her personal and family histories and let her know of advances in cancer genetics that may benefit this family.   Our contact number was provided. Jasmine Maddox questions were answered to her satisfaction, and she knows she is welcome to call us at anytime with additional questions or concerns.  Faith Rogue, MS Genetic Counselor [email or web address] Phone: [phone]

## 2018-02-25 NOTE — Telephone Encounter (Signed)
Revealed negative genetic testing.   We discussed that we do not know why she has breast cancer or why there is cancer in the family. It could be due to a different gene that we are not testing, or something our current technology cannot pick up.  It will be important for her to keep in contact with genetics to learn if additional testing may be needed in the future.  

## 2018-02-26 ENCOUNTER — Telehealth: Payer: Self-pay | Admitting: *Deleted

## 2018-02-26 ENCOUNTER — Other Ambulatory Visit: Payer: Self-pay | Admitting: General Surgery

## 2018-02-26 DIAGNOSIS — C50411 Malignant neoplasm of upper-outer quadrant of right female breast: Secondary | ICD-10-CM

## 2018-02-26 DIAGNOSIS — Z170 Estrogen receptor positive status [ER+]: Principal | ICD-10-CM

## 2018-02-26 NOTE — Telephone Encounter (Signed)
Spoke to pt concerning [place] from 1.29.20. Denies questions or concerns regarding dx or treatment care plan. Encourage pt to call with needs. Received verbal understanding.

## 2018-03-06 ENCOUNTER — Encounter: Payer: Self-pay | Admitting: *Deleted

## 2018-03-06 ENCOUNTER — Encounter (HOSPITAL_BASED_OUTPATIENT_CLINIC_OR_DEPARTMENT_OTHER): Payer: Self-pay

## 2018-03-06 ENCOUNTER — Other Ambulatory Visit: Payer: Self-pay

## 2018-03-06 DIAGNOSIS — D0511 Intraductal carcinoma in situ of right breast: Secondary | ICD-10-CM

## 2018-03-09 ENCOUNTER — Telehealth: Payer: Self-pay | Admitting: Hematology and Oncology

## 2018-03-09 NOTE — Telephone Encounter (Signed)
Scheduled appt per 2/14 sch message - left message and sent reminder letter in the mail.

## 2018-03-14 NOTE — H&P (Signed)
Jasmine Maddox Location: [facility] Patient #: [phone] DOB: 01-25-1960 Married / Language: English / Race: Refused to Report/Unreported Female       History of Present Illness  The patient is a 58 year old female who presents with breast cancer. This is a very pleasant 58 year old female. Jasmine Maddox  patient. Followed by Dr. Sondra Maddox  and Dr. Lindi Maddox. Jasmine Maddox is her PCP. [facility] Maddox is her imaging location.      Screening mammography showed a 4 mm cluster of calcifications at the 12 o'clock position of the right breast, middle depth, 4 cm from the nipple. There may be a small mass associated with this. Axillary ultrasound was negative. Image guided biopsy shows high-grade DCIS, ER 90%, PR 10%. The clip migrated 2 cm away from the calcifications       Family history strongly positive with mother dying of metastatic breast cancer at 66. Sister living at age 42 was treated for triple negative breast cancer with lumpectomy. Sister's genetics were reportedly negative. Social history married with 2 children and denies tobacco. Drinks alcohol occasionally. 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 Medical history reveals hypertension. Cholecystectomy. Very active athletically.     The patient had 74 Gene genetic panel which is negative.      The plan is right breast lumpectomy with radioactive seed localization followed by radiation therapy and then tamoxifen for 5 years She is aware of the possibility of reoperation for positive margins or a grade 2 invasive cancer. She understands all of these issues and all of her questions are answered    Allergies  No Known Allergies Allergies Reconciled   Medication History  Lisinopril (20MG  Tablet, Oral) Active. Medications Reconciled  Vitals  Weight: 136.4 lb Temp.: 97.33F(Temporal)  Pulse: 72 (Regular)  P.OX: 99% (Room air) BP: 118/74 (Sitting, Left Arm, Standard)       Physical Exam   General Mental Status-Alert. General Appearance-Not in acute distress. Build & Nutrition-Well nourished. Posture-Normal posture. Gait-Normal.  Head and Neck Head-normocephalic, atraumatic with no lesions or palpable masses. Trachea-midline. Thyroid Gland Characteristics - normal size and consistency and no palpable nodules.  Chest and Lung Exam Chest and lung exam reveals -on auscultation, normal breath sounds, no adventitious sounds and normal vocal resonance.  Breast Note: Symmetrical. 34-B bra size. Ecchymoses have resolved. No mass in either breast. No other skin changes. No axillary adenopathy.   Cardiovascular Cardiovascular examination reveals -normal heart sounds, regular rate and rhythm with no murmurs and femoral artery auscultation bilaterally reveals normal pulses, no bruits, no thrills.  Abdomen Inspection Inspection of the abdomen reveals - No Hernias. Palpation/Percussion Palpation and Percussion of the abdomen reveal - Soft, Non Tender, No Rigidity (guarding), No hepatosplenomegaly and No Palpable abdominal masses.  Neurologic Neurologic evaluation reveals -alert and oriented x 3 with no impairment of recent or remote memory, normal attention span and ability to concentrate, normal sensation and normal coordination.  Musculoskeletal Normal Exam - Bilateral-Upper Extremity Strength Normal and Lower Extremity Strength Normal.    Assessment & Plan   PRIMARY CANCER OF UPPER OUTER QUADRANT OF RIGHT FEMALE BREAST (C50.411)  You have been diagnosed with high-grade ductal carcinoma in situ of the right breast, 12 o'clock position. You are scheduled for right breast lumpectomy with radioactive seed localization on February 28  We discussed the indications, techniques, and risk of the surgery in detail You are aware of the possibility of a second operation if we have a positive margin You are  aware of the small but definite possibility of  upgrading to invasive cancer which might lead to a lymph node operation later Most likely you will just have one operation  After you recover from the surgery you will be referred back to Dr. Sondra Maddox and Dr. Lindi Maddox for final decision making  FAMILY HISTORY OF BREAST CANCER (Z80.3) HYPERTENSION, ESSENTIAL, BENIGN (I10) HISTORY OF LAPAROSCOPIC CHOLECYSTECTOMY (Z90.49)    Jasmine Maddox. Jasmine Maddox, M.D., [facility] General and Minimally invasive Surgery Breast and Colorectal Surgery Office:   [phone] Pager:   [phone]

## 2018-03-18 ENCOUNTER — Encounter (HOSPITAL_BASED_OUTPATIENT_CLINIC_OR_DEPARTMENT_OTHER)
Admission: RE | Admit: 2018-03-18 | Discharge: 2018-03-18 | Disposition: A | Discharge status: Home / Self Care | Payer: BLUE CROSS/BLUE SHIELD | Source: Ambulatory Visit | Attending: General Surgery | Admitting: General Surgery

## 2018-03-18 DIAGNOSIS — Z01810 Encounter for preprocedural cardiovascular examination: Secondary | ICD-10-CM | POA: Insufficient documentation

## 2018-03-18 NOTE — Progress Notes (Signed)
Ensure pre surgery drink given with instructions to complete by 0645 dos, pt verbalized understanding. 

## 2018-03-20 ENCOUNTER — Other Ambulatory Visit: Payer: Self-pay

## 2018-03-20 ENCOUNTER — Ambulatory Visit (HOSPITAL_BASED_OUTPATIENT_CLINIC_OR_DEPARTMENT_OTHER)
Admission: RE | Admit: 2018-03-20 | Discharge: 2018-03-20 | Disposition: A | Discharge status: Home / Self Care | Payer: BLUE CROSS/BLUE SHIELD | Attending: General Surgery | Admitting: General Surgery

## 2018-03-20 ENCOUNTER — Ambulatory Visit (HOSPITAL_BASED_OUTPATIENT_CLINIC_OR_DEPARTMENT_OTHER): Payer: BLUE CROSS/BLUE SHIELD | Admitting: Anesthesiology

## 2018-03-20 ENCOUNTER — Encounter (HOSPITAL_BASED_OUTPATIENT_CLINIC_OR_DEPARTMENT_OTHER): Payer: Self-pay

## 2018-03-20 ENCOUNTER — Encounter (HOSPITAL_BASED_OUTPATIENT_CLINIC_OR_DEPARTMENT_OTHER)
Admission: RE | Disposition: A | Discharge status: Home / Self Care | Payer: Self-pay | Source: Home / Self Care | Attending: General Surgery

## 2018-03-20 DIAGNOSIS — Z803 Family history of malignant neoplasm of breast: Secondary | ICD-10-CM | POA: Diagnosis not present

## 2018-03-20 DIAGNOSIS — D0511 Intraductal carcinoma in situ of right breast: Secondary | ICD-10-CM | POA: Diagnosis present

## 2018-03-20 DIAGNOSIS — N6021 Fibroadenosis of right breast: Secondary | ICD-10-CM | POA: Insufficient documentation

## 2018-03-20 DIAGNOSIS — Z79899 Other long term (current) drug therapy: Secondary | ICD-10-CM | POA: Insufficient documentation

## 2018-03-20 DIAGNOSIS — I10 Essential (primary) hypertension: Secondary | ICD-10-CM | POA: Diagnosis not present

## 2018-03-20 DIAGNOSIS — Z170 Estrogen receptor positive status [ER+]: Secondary | ICD-10-CM | POA: Insufficient documentation

## 2018-03-20 HISTORY — PX: BREAST LUMPECTOMY WITH RADIOACTIVE SEED LOCALIZATION: SHX6424

## 2018-03-20 SURGERY — BREAST LUMPECTOMY WITH RADIOACTIVE SEED LOCALIZATION
Anesthesia: General | Site: Breast | Laterality: Right

## 2018-03-20 MED ORDER — CELECOXIB 200 MG PO CAPS
200.0000 mg | ORAL_CAPSULE | ORAL | Status: AC
  Administered 2018-03-20: 200 mg via ORAL

## 2018-03-20 MED ORDER — GABAPENTIN 300 MG PO CAPS
300.0000 mg | ORAL_CAPSULE | ORAL | Status: AC
  Administered 2018-03-20: 300 mg via ORAL

## 2018-03-20 MED ORDER — CHLORHEXIDINE GLUCONATE CLOTH 2 % EX PADS: 6.0000 | MEDICATED_PAD | Freq: Once | CUTANEOUS | Status: DC

## 2018-03-20 MED ORDER — FENTANYL CITRATE (PF) 100 MCG/2ML IJ SOLN
50.0000 ug | INTRAMUSCULAR | Status: DC | PRN
  Administered 2018-03-20: 100 ug via INTRAVENOUS

## 2018-03-20 MED ORDER — CEFAZOLIN SODIUM-DEXTROSE 2-3 GM-%(50ML) IV SOLR
INTRAVENOUS | Status: DC | PRN
  Administered 2018-03-20: 2 g via INTRAVENOUS

## 2018-03-20 MED ORDER — ONDANSETRON HCL 4 MG/2ML IJ SOLN
INTRAMUSCULAR | Status: DC | PRN
  Administered 2018-03-20: 4 mg via INTRAVENOUS

## 2018-03-20 MED ORDER — GABAPENTIN 300 MG PO CAPS
ORAL_CAPSULE | ORAL | Status: AC
  Filled 2018-03-20: qty 1

## 2018-03-20 MED ORDER — ACETAMINOPHEN 500 MG PO TABS
ORAL_TABLET | ORAL | Status: AC
  Filled 2018-03-20: qty 2

## 2018-03-20 MED ORDER — LACTATED RINGERS IV SOLN
INTRAVENOUS | Status: DC
  Administered 2018-03-20: 09:00:00 via INTRAVENOUS

## 2018-03-20 MED ORDER — HYDROCODONE-ACETAMINOPHEN 5-325 MG PO TABS: 1.0000 | ORAL_TABLET | Freq: Four times a day (QID) | ORAL | 0 refills | Status: DC | PRN

## 2018-03-20 MED ORDER — ONDANSETRON HCL 4 MG/2ML IJ SOLN: 4.0000 mg | Freq: Once | INTRAMUSCULAR | Status: DC | PRN

## 2018-03-20 MED ORDER — PROPOFOL 10 MG/ML IV BOLUS
INTRAVENOUS | Status: DC | PRN
  Administered 2018-03-20: 150 mg via INTRAVENOUS

## 2018-03-20 MED ORDER — LIDOCAINE 2% (20 MG/ML) 5 ML SYRINGE
INTRAMUSCULAR | Status: AC
  Filled 2018-03-20: qty 5

## 2018-03-20 MED ORDER — OXYCODONE HCL 5 MG/5ML PO SOLN: 5.0000 mg | Freq: Once | ORAL | Status: DC | PRN

## 2018-03-20 MED ORDER — FENTANYL CITRATE (PF) 100 MCG/2ML IJ SOLN
INTRAMUSCULAR | Status: AC
  Filled 2018-03-20: qty 2

## 2018-03-20 MED ORDER — DEXAMETHASONE SODIUM PHOSPHATE 10 MG/ML IJ SOLN
INTRAMUSCULAR | Status: AC
  Filled 2018-03-20: qty 1

## 2018-03-20 MED ORDER — FENTANYL CITRATE (PF) 100 MCG/2ML IJ SOLN
25.0000 ug | INTRAMUSCULAR | Status: DC | PRN
  Administered 2018-03-20: 50 ug via INTRAVENOUS

## 2018-03-20 MED ORDER — ACETAMINOPHEN 500 MG PO TABS
1000.0000 mg | ORAL_TABLET | ORAL | Status: AC
  Administered 2018-03-20: 1000 mg via ORAL

## 2018-03-20 MED ORDER — LIDOCAINE HCL (CARDIAC) PF 100 MG/5ML IV SOSY
PREFILLED_SYRINGE | INTRAVENOUS | Status: DC | PRN
  Administered 2018-03-20: 60 mg via INTRAVENOUS

## 2018-03-20 MED ORDER — MIDAZOLAM HCL 2 MG/2ML IJ SOLN
1.0000 mg | INTRAMUSCULAR | Status: DC | PRN
  Administered 2018-03-20: 2 mg via INTRAVENOUS

## 2018-03-20 MED ORDER — CEFAZOLIN SODIUM-DEXTROSE 2-4 GM/100ML-% IV SOLN
INTRAVENOUS | Status: AC
  Filled 2018-03-20: qty 100

## 2018-03-20 MED ORDER — BUPIVACAINE-EPINEPHRINE 0.25% -1:200000 IJ SOLN
INTRAMUSCULAR | Status: DC | PRN
  Administered 2018-03-20: 10 mL

## 2018-03-20 MED ORDER — OXYCODONE HCL 5 MG PO TABS: 5.0000 mg | ORAL_TABLET | Freq: Once | ORAL | Status: DC | PRN

## 2018-03-20 MED ORDER — MIDAZOLAM HCL 2 MG/2ML IJ SOLN
INTRAMUSCULAR | Status: AC
  Filled 2018-03-20: qty 2

## 2018-03-20 MED ORDER — SCOPOLAMINE 1 MG/3DAYS TD PT72: 1.0000 | MEDICATED_PATCH | Freq: Once | TRANSDERMAL | Status: DC | PRN

## 2018-03-20 MED ORDER — CEFAZOLIN SODIUM-DEXTROSE 2-4 GM/100ML-% IV SOLN: 2.0000 g | INTRAVENOUS | Status: DC

## 2018-03-20 MED ORDER — CELECOXIB 200 MG PO CAPS
ORAL_CAPSULE | ORAL | Status: AC
  Filled 2018-03-20: qty 1

## 2018-03-20 MED ORDER — DEXAMETHASONE SODIUM PHOSPHATE 10 MG/ML IJ SOLN
INTRAMUSCULAR | Status: DC | PRN
  Administered 2018-03-20: 10 mg via INTRAVENOUS

## 2018-03-20 MED ORDER — PROPOFOL 10 MG/ML IV BOLUS
INTRAVENOUS | Status: AC
  Filled 2018-03-20: qty 40

## 2018-03-20 MED ORDER — PHENYLEPHRINE HCL 10 MG/ML IJ SOLN
INTRAMUSCULAR | Status: DC | PRN
  Administered 2018-03-20 (×2): 120 ug via INTRAVENOUS
  Administered 2018-03-20: 80 ug via INTRAVENOUS
  Administered 2018-03-20: 120 ug via INTRAVENOUS
  Administered 2018-03-20: 80 ug via INTRAVENOUS

## 2018-03-20 MED ORDER — ONDANSETRON HCL 4 MG/2ML IJ SOLN
INTRAMUSCULAR | Status: AC
  Filled 2018-03-20: qty 2

## 2018-03-20 SURGICAL SUPPLY — 64 items
APPLIER CLIP 9.375 MED OPEN (MISCELLANEOUS) ×3
BENZOIN TINCTURE PRP APPL 2/3 (GAUZE/BANDAGES/DRESSINGS) IMPLANT
BINDER BREAST LRG (GAUZE/BANDAGES/DRESSINGS) IMPLANT
BINDER BREAST MEDIUM (GAUZE/BANDAGES/DRESSINGS) ×3 IMPLANT
BINDER BREAST XLRG (GAUZE/BANDAGES/DRESSINGS) IMPLANT
BINDER BREAST XXLRG (GAUZE/BANDAGES/DRESSINGS) IMPLANT
BLADE HEX COATED 2.75 (ELECTRODE) ×3 IMPLANT
BLADE SURG 10 STRL SS (BLADE) IMPLANT
BLADE SURG 15 STRL LF DISP TIS (BLADE) ×1 IMPLANT
BLADE SURG 15 STRL SS (BLADE) ×2
CANISTER SUC SOCK COL 7IN (MISCELLANEOUS) IMPLANT
CANISTER SUCT 1200ML W/VALVE (MISCELLANEOUS) ×3 IMPLANT
CHLORAPREP W/TINT 26ML (MISCELLANEOUS) ×3 IMPLANT
CLIP APPLIE 9.375 MED OPEN (MISCELLANEOUS) ×1 IMPLANT
CLOSURE WOUND 1/2 X4 (GAUZE/BANDAGES/DRESSINGS)
COVER BACK TABLE 60X90IN (DRAPES) ×3 IMPLANT
COVER MAYO STAND STRL (DRAPES) ×3 IMPLANT
COVER PROBE W GEL 5X96 (DRAPES) ×3 IMPLANT
COVER WAND RF STERILE (DRAPES) IMPLANT
DECANTER SPIKE VIAL GLASS SM (MISCELLANEOUS) IMPLANT
DERMABOND ADVANCED (GAUZE/BANDAGES/DRESSINGS) ×2
DERMABOND ADVANCED .7 DNX12 (GAUZE/BANDAGES/DRESSINGS) ×1 IMPLANT
DRAPE LAPAROSCOPIC ABDOMINAL (DRAPES) ×3 IMPLANT
DRAPE UTILITY XL STRL (DRAPES) ×3 IMPLANT
DRSG PAD ABDOMINAL 8X10 ST (GAUZE/BANDAGES/DRESSINGS) ×3 IMPLANT
ELECT REM PT RETURN 9FT ADLT (ELECTROSURGICAL) ×3
ELECTRODE REM PT RTRN 9FT ADLT (ELECTROSURGICAL) ×1 IMPLANT
GAUZE SPONGE 4X4 12PLY STRL LF (GAUZE/BANDAGES/DRESSINGS) ×3 IMPLANT
GLOVE BIOGEL PI IND STRL 7.0 (GLOVE) ×1 IMPLANT
GLOVE BIOGEL PI INDICATOR 7.0 (GLOVE) ×2
GLOVE EUDERMIC 7 POWDERFREE (GLOVE) ×3 IMPLANT
GLOVE EXAM NITRILE MD LF STRL (GLOVE) ×3 IMPLANT
GLOVE SURG SYN 7.5  E (GLOVE) ×2
GLOVE SURG SYN 7.5 E (GLOVE) ×1 IMPLANT
GOWN STRL REUS W/ TWL LRG LVL3 (GOWN DISPOSABLE) ×1 IMPLANT
GOWN STRL REUS W/ TWL XL LVL3 (GOWN DISPOSABLE) ×2 IMPLANT
GOWN STRL REUS W/TWL LRG LVL3 (GOWN DISPOSABLE) ×2
GOWN STRL REUS W/TWL XL LVL3 (GOWN DISPOSABLE) ×4
ILLUMINATOR WAVEGUIDE N/F (MISCELLANEOUS) IMPLANT
KIT MARKER MARGIN INK (KITS) ×3 IMPLANT
LIGHT WAVEGUIDE WIDE FLAT (MISCELLANEOUS) IMPLANT
NEEDLE HYPO 25X1 1.5 SAFETY (NEEDLE) ×3 IMPLANT
NS IRRIG 1000ML POUR BTL (IV SOLUTION) ×3 IMPLANT
PACK BASIN DAY SURGERY FS (CUSTOM PROCEDURE TRAY) ×3 IMPLANT
PENCIL BUTTON HOLSTER BLD 10FT (ELECTRODE) ×3 IMPLANT
SHEET MEDIUM DRAPE 40X70 STRL (DRAPES) IMPLANT
SLEEVE SCD COMPRESS KNEE MED (MISCELLANEOUS) ×3 IMPLANT
SPONGE LAP 18X18 RF (DISPOSABLE) IMPLANT
SPONGE LAP 4X18 RFD (DISPOSABLE) ×3 IMPLANT
STRIP CLOSURE SKIN 1/2X4 (GAUZE/BANDAGES/DRESSINGS) IMPLANT
SUT ETHILON 3 0 FSL (SUTURE) IMPLANT
SUT MNCRL AB 4-0 PS2 18 (SUTURE) ×3 IMPLANT
SUT SILK 2 0 SH (SUTURE) ×3 IMPLANT
SUT VIC AB 2-0 CT1 27 (SUTURE)
SUT VIC AB 2-0 CT1 TAPERPNT 27 (SUTURE) IMPLANT
SUT VIC AB 3-0 SH 27 (SUTURE)
SUT VIC AB 3-0 SH 27X BRD (SUTURE) IMPLANT
SUT VICRYL 3-0 CR8 SH (SUTURE) ×3 IMPLANT
SYR 10ML LL (SYRINGE) ×3 IMPLANT
TOWEL GREEN STERILE FF (TOWEL DISPOSABLE) ×3 IMPLANT
TRAY FAXITRON CT DISP (TRAY / TRAY PROCEDURE) ×3 IMPLANT
TUBE CONNECTING 20'X1/4 (TUBING) ×1
TUBE CONNECTING 20X1/4 (TUBING) ×2 IMPLANT
YANKAUER SUCT BULB TIP NO VENT (SUCTIONS) ×3 IMPLANT

## 2018-03-20 NOTE — Anesthesia Preprocedure Evaluation (Signed)
Anesthesia Evaluation  Patient identified by MRN, date of birth, ID band Patient awake    Reviewed: Allergy & Precautions, NPO status , Patient's Chart, lab work & pertinent test results  Airway Mallampati: II  TM Distance: >3 FB Neck ROM: Full    Dental  (+) Teeth Intact   Pulmonary    breath sounds clear to auscultation       Cardiovascular hypertension,  Rhythm:Regular Rate:Normal     Neuro/Psych    GI/Hepatic   Endo/Other    Renal/GU      Musculoskeletal   Abdominal   Peds  Hematology   Anesthesia Other Findings   Reproductive/Obstetrics                             Anesthesia Physical Anesthesia Plan  ASA: II  Anesthesia Plan: General   Post-op Pain Management:    Induction: Intravenous  PONV Risk Score and Plan: Ondansetron and Dexamethasone  Airway Management Planned: LMA  Additional Equipment:   Intra-op Plan:   Post-operative Plan:   Informed Consent: I have reviewed the patients History and Physical, chart, labs and discussed the procedure including the risks, benefits and alternatives for the proposed anesthesia with the patient or authorized representative who has indicated his/her understanding and acceptance.     Dental advisory given  Plan Discussed with: CRNA and Anesthesiologist  Anesthesia Plan Comments:         Anesthesia Quick Evaluation

## 2018-03-20 NOTE — Interval H&P Note (Signed)
History and Physical Interval Note:  03/20/2018 8:33 AM  Jasmine Maddox  has presented today for surgery, with the diagnosis of dcis r breast  The various methods of treatment have been discussed with the patient and family. After consideration of risks, benefits and other options for treatment, the patient has consented to  Procedure(s): RIGHT BREAST LUMPECTOMY WITH RADIOACTIVE SEED LOCALIZATION (Right) as a surgical intervention .  The patient's history has been reviewed, patient examined, no change in status, stable for surgery.  I have reviewed the patient's chart and labs.  Questions were answered to the patient's satisfaction.     Adin Hector

## 2018-03-20 NOTE — Discharge Instructions (Signed)
°Post Anesthesia Home Care Instructions ° °Activity: °Get plenty of rest for the remainder of the day. A responsible individual must stay with you for 24 hours following the procedure.  °For the next 24 hours, DO NOT: °-Drive a car °-Operate machinery °-Drink alcoholic beverages °-Take any medication unless instructed by your physician °-Make any legal decisions or sign important papers. ° °Meals: °Start with liquid foods such as gelatin or soup. Progress to regular foods as tolerated. Avoid greasy, spicy, heavy foods. If nausea and/or vomiting occur, drink only clear liquids until the nausea and/or vomiting subsides. Call your physician if vomiting continues. ° °Special Instructions/Symptoms: °Your throat may feel dry or sore from the anesthesia or the breathing tube placed in your throat during surgery. If this causes discomfort, gargle with warm salt water. The discomfort should disappear within 24 hours. ° °If you had a scopolamine patch placed behind your ear for the management of post- operative nausea and/or vomiting: ° °1. The medication in the patch is effective for 72 hours, after which it should be removed.  Wrap patch in a tissue and discard in the trash. Wash hands thoroughly with soap and water. °2. You may remove the patch earlier than 72 hours if you experience unpleasant side effects which may include dry mouth, dizziness or visual disturbances. °3. Avoid touching the patch. Wash your hands with soap and water after contact with the patch. °   °[facility] °Office Phone Number [phone] ° °BREAST BIOPSY/ PARTIAL MASTECTOMY: POST OP INSTRUCTIONS ° °Always review your discharge instruction sheet given to you by the facility where your surgery was performed. ° °IF YOU HAVE DISABILITY OR FAMILY LEAVE FORMS, YOU MUST BRING THEM TO THE OFFICE FOR PROCESSING.  DO NOT GIVE THEM TO YOUR DOCTOR. ° °1. A prescription for pain medication may be given to you upon discharge.  Take your pain  medication as prescribed, if needed.  If narcotic pain medicine is not needed, then you may take acetaminophen (Tylenol) or ibuprofen (Advil) as needed. °2. Take your usually prescribed medications unless otherwise directed °3. If you need a refill on your pain medication, please contact your pharmacy.  They will contact our office to request authorization.  Prescriptions will not be filled after 5pm or on week-ends. °4. You should eat very light the first 24 hours after surgery, such as soup, crackers, pudding, etc.  Resume your normal diet the day after surgery. °5. Most patients will experience some swelling and bruising in the breast.  Ice packs and a good support bra will help.  Swelling and bruising can take several days to resolve.  °6. It is common to experience some constipation if taking pain medication after surgery.  Increasing fluid intake and taking a stool softener will usually help or prevent this problem from occurring.  A mild laxative (Milk of Magnesia or Miralax) should be taken according to package directions if there are no bowel movements after 48 hours. °7. Unless discharge instructions indicate otherwise, you may remove your bandages 24-48 hours after surgery, and you may shower at that time.  You may have steri-strips (small skin tapes) in place directly over the incision.  These strips should be left on the skin for 7-10 days.  If your surgeon used skin glue on the incision, you may shower in 24 hours.  The glue will flake off over the next 2-3 weeks.  Any sutures or staples will be removed at the office during your follow-up visit. °8. ACTIVITIES:    You may resume regular daily activities (gradually increasing) beginning the next day.  Wearing a good support bra or sports bra minimizes pain and swelling.  You may have sexual intercourse when it is comfortable. °a. You may drive when you no longer are taking prescription pain medication, you can comfortably wear a seatbelt, and you can  safely maneuver your car and apply brakes. °b. RETURN TO WORK:  ______________________________________________________________________________________ °9. You should see your doctor in the office for a follow-up appointment approximately two weeks after your surgery.  Your doctor’s nurse will typically make your follow-up appointment when she calls you with your pathology report.  Expect your pathology report 2-3 business days after your surgery.  You may call to check if you do not hear from us after three days. °10. OTHER INSTRUCTIONS: _______________________________________________________________________________________________ _____________________________________________________________________________________________________________________________________ °_____________________________________________________________________________________________________________________________________ °_____________________________________________________________________________________________________________________________________ ° °WHEN TO CALL YOUR DOCTOR: °1. Fever over 101.0 °2. Nausea and/or vomiting. °3. Extreme swelling or bruising. °4. Continued bleeding from incision. °5. Increased pain, redness, or drainage from the incision. ° °The clinic staff is available to answer your questions during regular business hours.  Please don’t hesitate to call and ask to speak to one of the nurses for clinical concerns.  If you have a medical emergency, go to the nearest emergency room or call 911.  A surgeon from [facility] is always on call at the hospital. ° °For further questions, please visit [email or web address]  ° ° ° ° ° ° °••••••••• ° ° °Managing Your Pain After Surgery Without Opioids ° ° ° °Thank you for participating in our program to help patients manage their pain after surgery without opioids. This is part of our effort to provide you with the best care possible, without exposing you or  your family to the risk that opioids pose. ° °What pain can I expect after surgery? °You can expect to have some pain after surgery. This is normal. The pain is typically worse the day after surgery, and quickly begins to get better. °Many studies have found that many patients are able to manage their pain after surgery with Over-the-Counter (OTC) medications such as Tylenol and Motrin. If you have a condition that does not allow you to take Tylenol or Motrin, notify your surgical team. ° °How will I manage my pain? °The best strategy for controlling your pain after surgery is around the clock pain control with Tylenol (acetaminophen) and Motrin (ibuprofen or Advil). Alternating these medications with each other allows you to maximize your pain control. In addition to Tylenol and Motrin, you can use heating pads or ice packs on your incisions to help reduce your pain. ° °How will I alternate your regular strength over-the-counter pain medication? °You will take a dose of pain medication every three hours. °; Start by taking 650 mg of Tylenol (2 pills of 325 mg) °; 3 hours later take 600 mg of Motrin (3 pills of 200 mg) °; 3 hours after taking the Motrin take 650 mg of Tylenol °; 3 hours after that take 600 mg of Motrin. ° ° °- 1 - ° °See example - if your first dose of Tylenol is at 12:00 PM ° ° °12:00 PM Tylenol 650 mg (2 pills of 325 mg)  °3:00 PM Motrin 600 mg (3 pills of 200 mg)  °6:00 PM Tylenol 650 mg (2 pills of 325 mg)  °9:00 PM Motrin 600 mg (3 pills of 200 mg)  °Continue alternating every 3 hours  ° °We recommend that you follow this   schedule around-the-clock for at least 3 days after surgery, or until you feel that it is no longer needed. Use the table on the last page of this handout to keep track of the medications you are taking. °Important: °Do not take more than 3000mg of Tylenol or 3200mg of Motrin in a 24-hour period. °Do not take ibuprofen/Motrin if you have a history of bleeding stomach ulcers,  severe kidney disease, &/or actively taking a blood thinner ° °What if I still have pain? °If you have pain that is not controlled with the over-the-counter pain medications (Tylenol and Motrin or Advil) you might have what we call “breakthrough” pain. You will receive a prescription for a small amount of an opioid pain medication such as Oxycodone, Tramadol, or Tylenol with Codeine. Use these opioid pills in the first 24 hours after surgery if you have breakthrough pain. Do not take more than 1 pill every 4-6 hours. ° °If you still have uncontrolled pain after using all opioid pills, don't hesitate to call our staff using the number provided. We will help make sure you are managing your pain in the best way possible, and if necessary, we can provide a prescription for additional pain medication. ° ° °Day 1   ° °Time  °Name of Medication Number of pills taken  °Amount of Acetaminophen  °Pain Level  ° °Comments  °AM PM       °AM PM       °AM PM       °AM PM       °AM PM       °AM PM       °AM PM       °AM PM       °Total Daily amount of Acetaminophen °Do not take more than  3,000 mg per day    ° ° °Day 2   ° °Time  °Name of Medication Number of pills °taken  °Amount of Acetaminophen  °Pain Level  ° °Comments  °AM PM       °AM PM       °AM PM       °AM PM       °AM PM       °AM PM       °AM PM       °AM PM       °Total Daily amount of Acetaminophen °Do not take more than  3,000 mg per day    ° ° °Day 3   ° °Time  °Name of Medication Number of pills taken  °Amount of Acetaminophen  °Pain Level  ° °Comments  °AM PM       °AM PM       °AM PM       °AM PM       ° ° ° °AM PM       °AM PM       °AM PM       °AM PM       °Total Daily amount of Acetaminophen °Do not take more than  3,000 mg per day    ° ° °Day 4   ° °Time  °Name of Medication Number of pills taken  °Amount of Acetaminophen  °Pain Level  ° °Comments  °AM PM       °AM PM       °AM PM       °AM PM       °AM PM       °  AM PM       °AM PM       °AM PM       °Total  Daily amount of Acetaminophen °Do not take more than  3,000 mg per day    ° ° °Day 5   ° °Time  °Name of Medication Number °of pills taken  °Amount of Acetaminophen  °Pain Level  ° °Comments  °AM PM       °AM PM       °AM PM       °AM PM       °AM PM       °AM PM       °AM PM       °AM PM       °Total Daily amount of Acetaminophen °Do not take more than  3,000 mg per day    ° ° ° °Day 6   ° °Time  °Name of Medication Number of pills °taken  °Amount of Acetaminophen  °Pain Level  °Comments  °AM PM       °AM PM       °AM PM       °AM PM       °AM PM       °AM PM       °AM PM       °AM PM       °Total Daily amount of Acetaminophen °Do not take more than  3,000 mg per day    ° ° °Day 7   ° °Time  °Name of Medication Number of pills taken  °Amount of Acetaminophen  °Pain Level  ° °Comments  °AM PM       °AM PM       °AM PM       °AM PM       °AM PM       °AM PM       °AM PM       °AM PM       °Total Daily amount of Acetaminophen °Do not take more than  3,000 mg per day    ° ° ° ° °For additional information about how and where to safely dispose of unused opioid °medications - https://www.morepowerfulnc.org ° °Disclaimer: This document contains information and/or instructional materials adapted from [facility] for the typical patient with your condition. It does not replace medical advice from your health care provider because your experience may differ from that of the °typical patient. Talk to your health care provider if you have any questions about this °document, your condition or your treatment plan. °Adapted from [facility] ° °

## 2018-03-20 NOTE — Anesthesia Procedure Notes (Signed)
Procedure Name: LMA Insertion Performed by: Verita Lamb, CRNA Pre-anesthesia Checklist: Patient identified, Emergency Drugs available, Suction available, Patient being monitored and Timeout performed Patient Re-evaluated:Patient Re-evaluated prior to induction Oxygen Delivery Method: Circle system utilized Preoxygenation: Pre-oxygenation with 100% oxygen Induction Type: IV induction LMA: LMA inserted LMA Size: 3.0 Number of attempts: 1 Placement Confirmation: positive ETCO2,  CO2 detector and breath sounds checked- equal and bilateral Tube secured with: Tape Dental Injury: Teeth and Oropharynx as per pre-operative assessment

## 2018-03-20 NOTE — Transfer of Care (Signed)
Immediate Anesthesia Transfer of Care Note  Patient: Jasmine Maddox  Procedure(s) Performed: RIGHT BREAST LUMPECTOMY WITH RADIOACTIVE SEED LOCALIZATION (Right Breast)  Patient Location: PACU  Anesthesia Type:General  Level of Consciousness: awake and sedated  Airway & Oxygen Therapy: Patient Spontanous Breathing and Patient connected to face mask oxygen  Post-op Assessment: Report given to RN and Post -op Vital signs reviewed and stable  Post vital signs: Reviewed and stable  Last Vitals:  Vitals Value Taken Time  BP 100/55 03/20/2018 11:24 AM  Temp    Pulse 56 03/20/2018 11:26 AM  Resp 9 03/20/2018 11:26 AM  SpO2 100 % 03/20/2018 11:26 AM  Vitals shown include unvalidated device data.  Last Pain:  Vitals:   03/20/18 0835  TempSrc: Oral  PainSc: 0-No pain         Complications: No apparent anesthesia complications

## 2018-03-20 NOTE — Anesthesia Postprocedure Evaluation (Signed)
Anesthesia Post Note  Patient: Jasmine Maddox  Procedure(s) Performed: RIGHT BREAST LUMPECTOMY WITH RADIOACTIVE SEED LOCALIZATION (Right Breast)     Patient location during evaluation: PACU Anesthesia Type: General Level of consciousness: awake and alert Pain management: pain level controlled Vital Signs Assessment: post-procedure vital signs reviewed and stable Respiratory status: spontaneous breathing, nonlabored ventilation, respiratory function stable and patient connected to nasal cannula oxygen Cardiovascular status: blood pressure returned to baseline and stable Postop Assessment: no apparent nausea or vomiting Anesthetic complications: no    Last Vitals:  Vitals:   03/20/18 1215 03/20/18 1246  BP: 124/77 (!) 161/90  Pulse: (!) 50 61  Resp: 11 18  Temp:  36.5 C  SpO2: 99% 100%    Last Pain:  Vitals:   03/20/18 1246  TempSrc:   PainSc: 4                  Henya Aguallo COKER

## 2018-03-20 NOTE — Op Note (Signed)
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 Dalbert Batman, M.D., FACS  Assistant:                      OR staff  Operative Indications:   The patient is a 58 year old female who presents with breast cancer. This is a very pleasant 58 year old female. [name]  patient. Followed by Dr. Sondra Come  and Dr. Lindi Adie. [name] is her PCP. [name] is her imaging location.      Screening mammography showed a 4 mm cluster of calcifications at the 12 o'clock position of the right breast, middle depth, 4 cm from the nipple. There may be a small mass associated with this. Axillary ultrasound was negative. Image guided biopsy shows high-grade DCIS, ER 90%, PR 10%. The clip migrated 2 cm away from the calcifications       Family history strongly positive with mother dying of metastatic breast cancer at 68. Sister living at age 44 was treated for triple negative breast cancer with lumpectomy. Sister's genetics were reportedly negative..      The patient had 19 Gene genetic panel which is negative.      The plan is right breast lumpectomy with radioactive seed localization followed by radiation therapy and then tamoxifen for 5 years She is aware of the possibility of reoperation for positive margins or a grade 2 invasive cancer. She understands all of these issues and all of her questions are answered  Operative Findings:       The radioactive seed was in the center of the calcifications.  The original biopsy clip had migrated 2 or 3 cm away.  The specimen mammogram looked very good with the seed in the center of the specimen.  The original biopsy clip was not removed.  I discussed this with Dr. Joanell Rising  at [facility] and he said that there was no need to remove the original biopsy clip because it was nowhere near the cancer.  The patient's breasts are very small and thin and so the breast tissue was taken from the dermis all the way down to the pectoralis muscle.  Therefore the anterior margin is the skin and the posterior margin is the muscle.  Procedure in Detail:          Following the induction of general LMA anesthesia the patient's right breast was prepped and draped in a sterile fashion.  Intravenous antibiotics were given.  Surgical timeout was performed.  0.25% Marcaine with epinephrine was used as a local infiltration anesthetic.    Using the neoprobe I identified the radioactive seed.  A transverse curvilinear incision was made superiorly in the right breast, several centimeters above the areolar margin.  The incision was made with a knife and then the lumpectomy was performed using the neoprobe and electrocautery.  The specimen was removed and marked with silk sutures and a 6 color ink kit.  The  specimen mammogram looked very good as described above.  The specimen was sent to the lab where the seed was retrieved.  Hemostasis was excellent.  The wound was irrigated.  The lumpectomy cavity was marked with 5 metal marker clips.  The breast tissues were reapproximated with interrupted 3-0 Vicryl and the skin closed with a running subcuticular 4-0 Monocryl and Dermabond.  Breast binder was placed and the patient taken to PACU in stable condition.  EBL 10 cc.  Counts correct.  Complications none.    Addendum: I logged onto the PMP aware website and reviewed her prescription medication history     Eloisa Chokshi M. Dalbert Batman, M.D., FACS General and Minimally Invasive Surgery Breast and Colorectal Surgery  03/20/2018 11:38 AM

## 2018-03-23 ENCOUNTER — Encounter (HOSPITAL_BASED_OUTPATIENT_CLINIC_OR_DEPARTMENT_OTHER): Payer: Self-pay | Admitting: General Surgery

## 2018-03-23 NOTE — Progress Notes (Signed)
Inform patient of Pathology report,. Breast pathology, as expected,  shows ductal carcinoma in situ. Margins are negative She will not need any further surgery This is good news I will discuss this in detail with her at her next office visit Let me know that you spoke to her   hmi

## 2018-03-26 NOTE — Progress Notes (Signed)
Patient Care Team: [name], PA as PCP - General (Family Medicine) [name], MD as Consulting Physician (General Surgery) [name], MD as Consulting Physician (Hematology and Oncology) [name], MD as Consulting Physician (Radiation Oncology)  DIAGNOSIS:    ICD-10-CM   1. Ductal carcinoma in situ (DCIS) of right breast D05.11     SUMMARY OF ONCOLOGIC HISTORY:   Ductal carcinoma in situ (DCIS) of right breast   01/30/2018 Mammogram    Diagnostic Mammogram  0.4cm cluster of calcifications, 12 o'clock position, 4cm fn, right breast    02/12/2018 Initial Diagnosis    Screening detected right breast calcifications 4 mm size UOQ 11:30 position stereotactic biopsy revealed high-grade DCIS ER 90%, PR 10%, Tis NX stage 0    03/20/2018 Surgery    Right lumpectomy: Scattered microscopic foci of DCIS intermediate grade, margins negative, ER 90%, PR 10%, Tis NX stage 0     CHIEF COMPLIANT: Follow-up s/p lumpectomy to review pathology  INTERVAL HISTORY: Jasmine Maddox is a 58 y.o. with above-mentioned history of right breast DCIS. She had a lumpectomy on 03/20/18 which showed grade 2 DCIS, ER 90%, PR 10%, with clear margins and no evidence of invasive cancer. She presents to the clinic today with her husband and is recovering well from surgery. She reviewed her medication list with me.   REVIEW OF SYSTEMS:   Constitutional: Denies fevers, chills or abnormal weight loss Eyes: Denies blurriness of vision Ears, nose, mouth, throat, and face: Denies mucositis or sore throat Respiratory: Denies cough, dyspnea or wheezes Cardiovascular: Denies palpitation, chest discomfort Gastrointestinal: Denies nausea, heartburn or change in bowel habits Skin: Denies abnormal skin rashes Lymphatics: Denies new lymphadenopathy or easy bruising Neurological: Denies numbness, tingling or new weaknesses Behavioral/Psych: Mood is stable, no new changes  Extremities: No lower extremity  edema Breast: denies any pain or lumps or nodules in either breasts All other systems were reviewed with the patient and are negative.  I have reviewed the past medical history, past surgical history, social history and family history with the patient and they are unchanged from previous note.  ALLERGIES:  has No Known Allergies.  MEDICATIONS:  Current Outpatient Medications  Medication Sig Dispense Refill  . lisinopril (PRINIVIL,ZESTRIL) 20 MG tablet Take 20 mg by mouth daily.     No current facility-administered medications for this visit.     PHYSICAL EXAMINATION: ECOG PERFORMANCE STATUS: 1 - Symptomatic but completely ambulatory  Vitals:   03/27/18 1126  BP: 137/80  Pulse: 64  Resp: 18  Temp: 98.5 F (36.9 C)  SpO2: 100%   Filed Weights   03/27/18 1126  Weight: 137 lb 8 oz (62.4 kg)    GENERAL: alert, no distress and comfortable SKIN: skin color, texture, turgor are normal, no rashes or significant lesions EYES: normal, Conjunctiva are pink and non-injected, sclera clear OROPHARYNX: no exudate, no erythema and lips, buccal mucosa, and tongue normal  NECK: supple, thyroid normal size, non-tender, without nodularity LYMPH: no palpable lymphadenopathy in the cervical, axillary or inguinal LUNGS: clear to auscultation and percussion with normal breathing effort HEART: regular rate & rhythm and no murmurs and no lower extremity edema ABDOMEN: abdomen soft, non-tender and normal bowel sounds MUSCULOSKELETAL: no cyanosis of digits and no clubbing  NEURO: alert & oriented x 3 with fluent speech, no focal motor/sensory deficits EXTREMITIES: No lower extremity edema  LABORATORY DATA:  I have reviewed the data as listed CMP Latest Ref Rng & Units 02/18/2018  Glucose 70 -  99 mg/dL 94  BUN 6 - 20 mg/dL 10  Creatinine 0.44 - 1.00 mg/dL 0.77  Sodium 135 - 145 mmol/L 140  Potassium 3.5 - 5.1 mmol/L 4.0  Chloride 98 - 111 mmol/L 106  CO2 22 - 32 mmol/L 27  Calcium 8.9 - 10.3  mg/dL 9.5  Total Protein 6.5 - 8.1 g/dL 7.7  Total Bilirubin 0.3 - 1.2 mg/dL 0.4  Alkaline Phos 38 - 126 U/L 64  AST 15 - 41 U/L 28  ALT 0 - 44 U/L 32    Lab Results  Component Value Date   WBC 4.7 02/18/2018   HGB 12.5 02/18/2018   HCT 37.2 02/18/2018   MCV 88.6 02/18/2018   PLT 183 02/18/2018   NEUTROABS 2.0 02/18/2018    ASSESSMENT & PLAN:  Ductal carcinoma in situ (DCIS) of right breast 03/20/2018:Right lumpectomy: Scattered microscopic foci of DCIS intermediate grade, margins negative, ER 90%, PR 10%, Tis NX stage 0  Pathology counseling: I discussed the final pathology report of the patient provided  a copy of this report. I discussed the margins. We also discussed the final staging along with previously performed ER/PR testing.  Treatment plan: 1.  Adjuvant radiation therapy followed by 2.  Adjuvant tamoxifen once daily for 5 years  Return to clinic at the end of radiation therapy     No orders of the defined types were placed in this encounter.  The patient has a good understanding of the overall plan. she agrees with it. she will call with any problems that may develop before the next visit here.  [name], MD 03/27/2018  Julious Oka Dorshimer am acting as scribe for Dr. [name].  I have reviewed the above documentation for accuracy and completeness, and I agree with the above.

## 2018-03-27 ENCOUNTER — Inpatient Hospital Stay: Payer: BLUE CROSS/BLUE SHIELD | Attending: Hematology and Oncology | Admitting: Hematology and Oncology

## 2018-03-27 DIAGNOSIS — D0511 Intraductal carcinoma in situ of right breast: Secondary | ICD-10-CM | POA: Diagnosis present

## 2018-03-27 NOTE — Assessment & Plan Note (Signed)
03/20/2018:Right lumpectomy: Scattered microscopic foci of DCIS intermediate grade, margins negative, ER 90%, PR 10%, Tis NX stage 0  Pathology counseling: I discussed the final pathology report of the patient provided  a copy of this report. I discussed the margins. We also discussed the final staging along with previously performed ER/PR testing.  Treatment plan: 1.  Adjuvant radiation therapy followed by 2.  Adjuvant tamoxifen once daily for 5 years  Return to clinic at the end of radiation therapy

## 2018-04-01 NOTE — Progress Notes (Signed)
Location of Breast Cancer: Ductal carcinoma in situ (DCIS) of right breast  Histology per Pathology Report: 03/20/18:  Diagnosis Breast, lumpectomy, Right w/seed - SCATTERED MICROSCOPIC FOCI OF DUCTAL CARCINOMA IN SITU, INTERMEDIATE NUCLEAR GRADE. SEE NOTE. - DCIS IS 0.9 CM FROM THE POSTERIOR MARGIN. - BACKGROUND BREAST PARENCHYMA WITH FIBROCYSTIC CHANGE, INCLUDING SCLEROSING ADENOSIS AND USUAL DUCTAL HYPERPLASIA WITH CALCIFICATIONS. - NO EVIDENCE OF INVASIVE CARCINOMA.  Receptor Status: ER(90%), PR (10%)  Did patient present with symptoms (if so, please note symptoms) or was this found on screening mammography?: She had routine screening mammography on January 6 showing a possible abnormality in the right breast. She underwent bilateral diagnostic mammography with tomography and right breast ultrasonography at [facility] on January 10 showing: a 0.4 cm area of both linea layering and heterogenous calcifications in the right breast are indeterminate and possibly associated with a mass. The ultrasound showed grouped microcalcifications on the right that were suspicious for malignancy  Biopsy on January 20 showed: ductal carcinoma in situ with calcifications in the 11:30 o'clock. Prognostic indicators significant for: estrogen receptor, 90% positive and progesterone receptor, 10% positive, both with strong staining intensity.   Past/Anticipated interventions by surgeon, if any: 03/20/18: Procedure:                 Right breast lumpectomy with radioactive seed localization and margin assessment  Surgeon:                     Edsel Petrin. Dalbert Batman, M.D., [facility]   Past/Anticipated interventions by medical oncology, if any: Chemotherapy Per Dr. Lindi Adie 03/27/18:  Treatment plan: 1.  Adjuvant radiation therapy followed by 2.  Adjuvant tamoxifen once daily for 5 years  Lymphedema issues, if any: No  Pain issues, if any:  No  SAFETY ISSUES:  Prior radiation? No  Pacemaker/ICD? No  Possible current  pregnancy? No  Is the patient on methotrexate? No  Current Complaints / other details:  Pt presents today for consult with Dr. Sondra Come for Radiation Oncology. Pt is unaccompanied.     Loma Sousa, RN 04/08/2018,2:57 PM

## 2018-04-08 ENCOUNTER — Ambulatory Visit
Admission: RE | Admit: 2018-04-08 | Discharge: 2018-04-08 | Disposition: A | Discharge status: Home / Self Care | Payer: BLUE CROSS/BLUE SHIELD | Source: Ambulatory Visit | Attending: Radiation Oncology | Admitting: Radiation Oncology

## 2018-04-08 ENCOUNTER — Other Ambulatory Visit: Payer: Self-pay

## 2018-04-08 ENCOUNTER — Encounter: Payer: Self-pay | Admitting: Radiation Oncology

## 2018-04-08 ENCOUNTER — Encounter (INDEPENDENT_AMBULATORY_CARE_PROVIDER_SITE_OTHER): Payer: Self-pay

## 2018-04-08 VITALS — BP 126/79 | HR 70 | Temp 99.1°F | Resp 18 | Ht 63.0 in | Wt 139.4 lb

## 2018-04-08 DIAGNOSIS — D0511 Intraductal carcinoma in situ of right breast: Secondary | ICD-10-CM | POA: Diagnosis present

## 2018-04-08 DIAGNOSIS — Z170 Estrogen receptor positive status [ER+]: Secondary | ICD-10-CM | POA: Insufficient documentation

## 2018-04-08 DIAGNOSIS — Z79899 Other long term (current) drug therapy: Secondary | ICD-10-CM | POA: Insufficient documentation

## 2018-04-08 NOTE — Patient Instructions (Signed)
Coronavirus (COVID-19) Are you at risk?  Are you at risk for the Coronavirus (COVID-19)?  To be considered HIGH RISK for Coronavirus (COVID-19), you have to meet the following criteria:  . Traveled to [place], [place], [place], [place] or [place]; or in the [place] to [place], [place], [place], or [place]; and have fever, cough, and shortness of breath within the last 2 weeks of travel OR . Been in close contact with a person diagnosed with COVID-19 within the last 2 weeks and have fever, cough, and shortness of breath . IF YOU DO NOT MEET THESE CRITERIA, YOU ARE CONSIDERED LOW RISK FOR COVID-19.  What to do if you are HIGH RISK for COVID-19?  . If you are having a medical emergency, call 911. . Seek medical care right away. Before you go to a doctor's office, urgent care or emergency department, call ahead and tell them about your recent travel, contact with someone diagnosed with COVID-19, and your symptoms. You should receive instructions from your physician's office regarding next steps of care.  . When you arrive at healthcare provider, tell the healthcare staff immediately you have returned from visiting [place], [place], [place], [place] or [place]; or traveled in the [place] to [place], [place], [place], or [place]; in the last two weeks or you have been in close contact with a person diagnosed with COVID-19 in the last 2 weeks.   . Tell the health care staff about your symptoms: fever, cough and shortness of breath. . After you have been seen by a medical provider, you will be either: o Tested for (COVID-19) and discharged home on quarantine except to seek medical care if symptoms worsen, and asked to  - Stay home and avoid contact with others until you get your results (4-5 days)  - Avoid travel on public transportation if possible (such as bus, train, or airplane) or o Sent to the Emergency Department by EMS for evaluation, COVID-19 testing, and possible  admission depending on your condition and test results.  What to do if you are LOW RISK for COVID-19?  Reduce your risk of any infection by using the same precautions used for avoiding the common cold or flu:  . Wash your hands often with soap and warm water for at least 20 seconds.  If soap and water are not readily available, use an alcohol-based hand sanitizer with at least 60% alcohol.  . If coughing or sneezing, cover your mouth and nose by coughing or sneezing into the elbow areas of your shirt or coat, into a tissue or into your sleeve (not your hands). . Avoid shaking hands with others and consider head nods or verbal greetings only. . Avoid touching your eyes, nose, or mouth with unwashed hands.  . Avoid close contact with people who are sick. . Avoid places or events with large numbers of people in one location, like concerts or sporting events. . Carefully consider travel plans you have or are making. . If you are planning any travel outside or inside the [place], visit the CDC's Travelers' Health webpage for the latest health notices. . If you have some symptoms but not all symptoms, continue to monitor at home and seek medical attention if your symptoms worsen. . If you are having a medical emergency, call 911.   ADDITIONAL HEALTHCARE OPTIONS FOR PATIENTS  [facility] / e-Visit: [email or web address]         [facility]: [phone]  [facility]: [phone]                   [facility]: [phone]   

## 2018-04-08 NOTE — Progress Notes (Signed)
Radiation Oncology         [phone] ________________________________  Name: Jasmine Maddox MRN: [id number]  Date: 04/08/2018  DOB: Feb 23, 1960  Reevaluation Visit Note  CC: [name], PA  [name], MD    ICD-10-CM   1. Ductal carcinoma in situ (DCIS) of right breast D05.11     Diagnosis:   Stage 0 (pTis, Nx) right Breast UOQ  Ductal Carcinoma in Situ, ER+ / PR+ High grade   Narrative:  The patient returns today for reevaluation.  she is doing well overall. They are unaccompanied. She has not yet met with Dr. Lindi Adie to discuss hormonal therapy. She has her first surgical follow-up with Dr. Dalbert Batman tomorrow (she is 3 weeks post-op). 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  Since they were last seen in the office, she had a lumpectomy of the right breast w/seed on 03/20/18. Pathology from this procedure showed scattered microscopic foci of ductal carcinoma in situ, intermediate nuclear grade. DCIS was 0.9 cm from the posterior margin. Background breast parenchyma with fibrocystic change was noted, including sclerosing adenosis and usual ductal hyperplasia with calcifications. No evidence of invasive carcinoma.    On review of systems, she reports feeling well following her recent surgery. she denies  any other symptoms. Pertinent positives are listed and detailed within the above HPI.                 ALLERGIES:  has No Known Allergies.  Meds: Current Outpatient Medications  Medication Sig Dispense Refill  . lisinopril (PRINIVIL,ZESTRIL) 20 MG tablet Take 20 mg by mouth daily.     No current facility-administered medications for this encounter.     Physical Findings: The patient is in no acute distress. Patient is alert and oriented.  height is '5\' 3"'$  (1.6 m) and weight is 139 lb 6 oz (63.2 kg). Her oral temperature is 99.1 F (37.3 C). Her blood pressure is 126/79 and her pulse is 70. Her respiration is 18 and oxygen saturation is 100%. .  No  significant changes. Lungs are clear to auscultation bilaterally. Heart has regular rate and rhythm. No palpable cervical, supraclavicular, or axillary adenopathy. Abdomen soft, non-tender, normal bowel sounds.  Right breast with a well-healing scar in the upper outer quadrant, healing well without signs of drainage or infection.    Lab Findings: Lab Results  Component Value Date   WBC 4.7 02/18/2018   HGB 12.5 02/18/2018   HCT 37.2 02/18/2018   MCV 88.6 02/18/2018   PLT 183 02/18/2018    Radiographic Findings: No results found.  Impression:    Intraductal carcinoma of the right breast, intermediate to high grade. Pt would be a good candidate for breast conservation with radiation therapy directed at the right breast. Given the patient's young age, as well as the intermediate to high grade nature of her tumor, I would not recommend lumpectomy alone in this situation.   Today, I talked to the patient and about the findings and work-up thus far.  We discussed the natural history of her cancer and general treatment, highlighting the role of radiotherapy in the management. She would be a good candidate for hypofractionated, accelerated radiation therapy over approximately 4 weeks.  We discussed the available radiation techniques, and focused on the details of logistics and delivery.  We reviewed the anticipated acute and late sequelae associated with radiation in this setting.  The patient was encouraged to ask questions that I answered to the  best of my ability.  A patient consent form was discussed and signed.  We retained a copy for our records.  The patient would like to proceed with radiation and will be scheduled for CT simulation.  Plan: She is scheduled for CT simulation March 31st at [facility].  Treatment to begin approximately a week later.  ____________________________________   Blair Promise, PhD, MD    This document serves as a record of services personally performed by [name], MD. It was created on his behalf by Mary-Margaret Loma Messing, a trained medical scribe. The creation of this record is based on the scribe's personal observations and the provider's statements to them. This document has been checked and approved by the attending provider.

## 2018-04-21 ENCOUNTER — Ambulatory Visit
Admission: RE | Admit: 2018-04-21 | Discharge: 2018-04-21 | Disposition: A | Discharge status: Home / Self Care | Payer: BLUE CROSS/BLUE SHIELD | Source: Ambulatory Visit | Attending: Radiation Oncology | Admitting: Radiation Oncology

## 2018-04-21 ENCOUNTER — Other Ambulatory Visit: Payer: Self-pay

## 2018-04-21 DIAGNOSIS — D0511 Intraductal carcinoma in situ of right breast: Secondary | ICD-10-CM

## 2018-04-21 DIAGNOSIS — Z510 Encounter for antineoplastic radiation therapy: Secondary | ICD-10-CM | POA: Diagnosis present

## 2018-04-21 DIAGNOSIS — Z170 Estrogen receptor positive status [ER+]: Secondary | ICD-10-CM | POA: Diagnosis not present

## 2018-04-21 NOTE — Progress Notes (Signed)
  Radiation Oncology         [phone] ________________________________  Name: Jasmine Maddox MRN: [id number]  Date: 04/21/2018  DOB: 22-May-1960  SIMULATION AND TREATMENT PLANNING NOTE    ICD-10-CM   1. Ductal carcinoma in situ (DCIS) of right breast D05.11     DIAGNOSIS: High-grade ductal carcinoma in situ of the right breast, ER+, PR+  NARRATIVE:  The patient was brought to the [facility].  Identity was confirmed.  All relevant records and images related to the planned course of therapy were reviewed.  The patient freely provided informed written consent to proceed with treatment after reviewing the details related to the planned course of therapy. The consent form was witnessed and verified by the simulation staff.  Then, the patient was set-up in a stable reproducible  supine position for radiation therapy.  CT images were obtained.  Surface markings were placed.  The CT images were loaded into the planning software.  Then the target and avoidance structures were contoured.  Treatment planning then occurred.  The radiation prescription was entered and confirmed.  Then, I designed and supervised the construction of a total of 3 medically necessary complex treatment devices.  I have requested : 3D Simulation  I have requested a DVH of the following structures: Heart, lungs, lumpectomy cavity.  I have ordered:CBC  PLAN:  The patient will receive 40.05 Gy in 15 fractions.  Patient will then proceed with a boost to the lumpectomy cavity of 10 Gray in 5 fractions for a cumulative dose of 50.05 Gy.   Optical Surface Tracking Plan:  Since intensity modulated radiotherapy (IMRT) and 3D conformal radiation treatment methods are predicated on accurate and precise positioning for treatment, intrafraction motion monitoring is medically necessary to ensure accurate and safe treatment delivery.  The ability to quantify intrafraction motion without excessive ionizing radiation dose can  only be performed with optical surface tracking. Accordingly, surface imaging offers the opportunity to obtain 3D measurements of patient position throughout IMRT and 3D treatments without excessive radiation exposure.  I am ordering optical surface tracking for this patient's upcoming course of radiotherapy. ________________________________   -----------------------------------  Blair Promise, PhD, MD

## 2018-04-22 DIAGNOSIS — Z510 Encounter for antineoplastic radiation therapy: Secondary | ICD-10-CM | POA: Diagnosis not present

## 2018-04-22 DIAGNOSIS — Z170 Estrogen receptor positive status [ER+]: Secondary | ICD-10-CM | POA: Insufficient documentation

## 2018-04-22 DIAGNOSIS — D0511 Intraductal carcinoma in situ of right breast: Secondary | ICD-10-CM | POA: Insufficient documentation

## 2018-04-23 ENCOUNTER — Telehealth: Payer: Self-pay | Admitting: Hematology and Oncology

## 2018-04-23 NOTE — Telephone Encounter (Signed)
Scheduled per sch msg. Called and spoke with patient. Confirmed date and time ° °

## 2018-04-28 ENCOUNTER — Ambulatory Visit
Admission: RE | Admit: 2018-04-28 | Discharge: 2018-04-28 | Disposition: A | Discharge status: Home / Self Care | Payer: BLUE CROSS/BLUE SHIELD | Source: Ambulatory Visit | Attending: Radiation Oncology | Admitting: Radiation Oncology

## 2018-04-28 ENCOUNTER — Other Ambulatory Visit: Payer: Self-pay

## 2018-04-28 DIAGNOSIS — Z510 Encounter for antineoplastic radiation therapy: Secondary | ICD-10-CM | POA: Diagnosis not present

## 2018-04-28 DIAGNOSIS — D0511 Intraductal carcinoma in situ of right breast: Secondary | ICD-10-CM

## 2018-04-28 MED ORDER — RADIAPLEXRX EX GEL
Freq: Two times a day (BID) | CUTANEOUS | Status: DC
  Administered 2018-04-28: 13:00:00 via TOPICAL

## 2018-04-28 MED ORDER — ALRA NON-METALLIC DEODORANT (RAD-ONC)
1.0000 "application " | Freq: Once | TOPICAL | Status: AC
  Administered 2018-04-28: 1 via TOPICAL

## 2018-04-28 NOTE — Progress Notes (Signed)
  Radiation Oncology         [phone] ________________________________  Name: Jasmine Maddox MRN: [id number]  Date: 04/28/2018  DOB: 1960/12/12  Simulation Verification Note    ICD-10-CM   1. Ductal carcinoma in situ (DCIS) of right breast D05.11 hyaluronate sodium (RADIAPLEXRX) gel    non-metallic deodorant (ALRA) 1 application    Status: outpatient  NARRATIVE: The patient was brought to the treatment unit and placed in the planned treatment position. The clinical setup was verified. Then port films were obtained and uploaded to the radiation oncology medical record software.  The treatment beams were carefully compared against the planned radiation fields. The position location and shape of the radiation fields was reviewed. They targeted volume of tissue appears to be appropriately covered by the radiation beams. Organs at risk appear to be excluded as planned.  Based on my personal review, I approved the simulation verification. The patient's treatment will proceed as planned.  -----------------------------------  Blair Promise, PhD, MD

## 2018-04-29 ENCOUNTER — Ambulatory Visit
Admission: RE | Admit: 2018-04-29 | Discharge: 2018-04-29 | Disposition: A | Discharge status: Home / Self Care | Payer: BLUE CROSS/BLUE SHIELD | Source: Ambulatory Visit | Attending: Radiation Oncology | Admitting: Radiation Oncology

## 2018-04-29 ENCOUNTER — Other Ambulatory Visit: Payer: Self-pay

## 2018-04-29 DIAGNOSIS — Z510 Encounter for antineoplastic radiation therapy: Secondary | ICD-10-CM | POA: Diagnosis not present

## 2018-04-30 ENCOUNTER — Other Ambulatory Visit: Payer: Self-pay

## 2018-04-30 ENCOUNTER — Ambulatory Visit
Admission: RE | Admit: 2018-04-30 | Discharge: 2018-04-30 | Disposition: A | Discharge status: Home / Self Care | Payer: BLUE CROSS/BLUE SHIELD | Source: Ambulatory Visit | Attending: Radiation Oncology | Admitting: Radiation Oncology

## 2018-04-30 DIAGNOSIS — Z510 Encounter for antineoplastic radiation therapy: Secondary | ICD-10-CM | POA: Diagnosis not present

## 2018-05-01 ENCOUNTER — Other Ambulatory Visit: Payer: Self-pay

## 2018-05-01 ENCOUNTER — Ambulatory Visit
Admission: RE | Admit: 2018-05-01 | Discharge: 2018-05-01 | Disposition: A | Discharge status: Home / Self Care | Payer: BLUE CROSS/BLUE SHIELD | Source: Ambulatory Visit | Attending: Radiation Oncology | Admitting: Radiation Oncology

## 2018-05-01 DIAGNOSIS — Z510 Encounter for antineoplastic radiation therapy: Secondary | ICD-10-CM | POA: Diagnosis not present

## 2018-05-04 ENCOUNTER — Other Ambulatory Visit: Payer: Self-pay

## 2018-05-04 ENCOUNTER — Ambulatory Visit
Admission: RE | Admit: 2018-05-04 | Discharge: 2018-05-04 | Disposition: A | Discharge status: Home / Self Care | Payer: BLUE CROSS/BLUE SHIELD | Source: Ambulatory Visit | Attending: Radiation Oncology | Admitting: Radiation Oncology

## 2018-05-04 DIAGNOSIS — Z510 Encounter for antineoplastic radiation therapy: Secondary | ICD-10-CM | POA: Diagnosis not present

## 2018-05-05 ENCOUNTER — Other Ambulatory Visit: Payer: Self-pay

## 2018-05-05 ENCOUNTER — Ambulatory Visit
Admission: RE | Admit: 2018-05-05 | Discharge: 2018-05-05 | Disposition: A | Discharge status: Home / Self Care | Payer: BLUE CROSS/BLUE SHIELD | Source: Ambulatory Visit | Attending: Radiation Oncology | Admitting: Radiation Oncology

## 2018-05-05 DIAGNOSIS — Z510 Encounter for antineoplastic radiation therapy: Secondary | ICD-10-CM | POA: Diagnosis not present

## 2018-05-06 ENCOUNTER — Ambulatory Visit
Admission: RE | Admit: 2018-05-06 | Discharge: 2018-05-06 | Disposition: A | Discharge status: Home / Self Care | Payer: BLUE CROSS/BLUE SHIELD | Source: Ambulatory Visit | Attending: Radiation Oncology | Admitting: Radiation Oncology

## 2018-05-06 ENCOUNTER — Other Ambulatory Visit: Payer: Self-pay

## 2018-05-06 DIAGNOSIS — Z510 Encounter for antineoplastic radiation therapy: Secondary | ICD-10-CM | POA: Diagnosis not present

## 2018-05-07 ENCOUNTER — Other Ambulatory Visit: Payer: Self-pay

## 2018-05-07 ENCOUNTER — Ambulatory Visit
Admission: RE | Admit: 2018-05-07 | Discharge: 2018-05-07 | Disposition: A | Discharge status: Home / Self Care | Payer: BLUE CROSS/BLUE SHIELD | Source: Ambulatory Visit | Attending: Radiation Oncology | Admitting: Radiation Oncology

## 2018-05-07 DIAGNOSIS — Z510 Encounter for antineoplastic radiation therapy: Secondary | ICD-10-CM | POA: Diagnosis not present

## 2018-05-08 ENCOUNTER — Ambulatory Visit
Admission: RE | Admit: 2018-05-08 | Discharge: 2018-05-08 | Disposition: A | Discharge status: Home / Self Care | Payer: BLUE CROSS/BLUE SHIELD | Source: Ambulatory Visit | Attending: Radiation Oncology | Admitting: Radiation Oncology

## 2018-05-08 ENCOUNTER — Other Ambulatory Visit: Payer: Self-pay

## 2018-05-08 DIAGNOSIS — Z510 Encounter for antineoplastic radiation therapy: Secondary | ICD-10-CM | POA: Diagnosis not present

## 2018-05-11 ENCOUNTER — Ambulatory Visit
Admission: RE | Admit: 2018-05-11 | Discharge: 2018-05-11 | Disposition: A | Discharge status: Home / Self Care | Payer: BLUE CROSS/BLUE SHIELD | Source: Ambulatory Visit | Attending: Radiation Oncology | Admitting: Radiation Oncology

## 2018-05-11 ENCOUNTER — Other Ambulatory Visit: Payer: Self-pay

## 2018-05-11 DIAGNOSIS — Z510 Encounter for antineoplastic radiation therapy: Secondary | ICD-10-CM | POA: Diagnosis not present

## 2018-05-11 NOTE — Assessment & Plan Note (Signed)
03/20/2018:Right lumpectomy: Scattered microscopic foci of DCIS intermediate grade, margins negative, ER 90%, PR 10%, Tis NX stage 0 Adjuvant radiation 04/29/2018-  Treatment plan: Adjuvant tamoxifen 20 mg once daily x5 years Tamoxifen counseling:We discussed the risks and benefits of tamoxifen. These include but not limited to insomnia, hot flashes, mood changes, vaginal dryness, and weight gain. Although rare, serious side effects including endometrial cancer, risk of blood clots were also discussed. We strongly believe that the benefits far outweigh the risks. Patient understands these risks and consented to starting treatment. Planned treatment duration is 5 years.  Return to clinic in 3 months for survivorship care plan visit.

## 2018-05-12 ENCOUNTER — Other Ambulatory Visit: Payer: Self-pay

## 2018-05-12 ENCOUNTER — Ambulatory Visit
Admission: RE | Admit: 2018-05-12 | Discharge: 2018-05-12 | Disposition: A | Discharge status: Home / Self Care | Payer: BLUE CROSS/BLUE SHIELD | Source: Ambulatory Visit | Attending: Radiation Oncology | Admitting: Radiation Oncology

## 2018-05-12 DIAGNOSIS — D0511 Intraductal carcinoma in situ of right breast: Secondary | ICD-10-CM

## 2018-05-12 DIAGNOSIS — Z510 Encounter for antineoplastic radiation therapy: Secondary | ICD-10-CM | POA: Diagnosis not present

## 2018-05-12 NOTE — Progress Notes (Signed)
.  Simulation verification  The patient was brought to the treatment machine and placed in the plan treatment position.  Clinical set up was verified to ensure that the target region is appropriately covered for the patient's upcoming electron boost treatment.  The targeted volume of tissue is appropriately covered by the radiation field.  Based on my personal review, I approve the simulation verification.  The patient's treatment will proceed as planned.  ------------------------------------------------  -----------------------------------  Vinod Mikesell D. Yania Bogie, PhD, MD  

## 2018-05-13 ENCOUNTER — Ambulatory Visit: Payer: BLUE CROSS/BLUE SHIELD

## 2018-05-14 ENCOUNTER — Ambulatory Visit
Admission: RE | Admit: 2018-05-14 | Discharge: 2018-05-14 | Disposition: A | Discharge status: Home / Self Care | Payer: BLUE CROSS/BLUE SHIELD | Source: Ambulatory Visit | Attending: Radiation Oncology | Admitting: Radiation Oncology

## 2018-05-14 ENCOUNTER — Other Ambulatory Visit: Payer: Self-pay

## 2018-05-14 DIAGNOSIS — Z510 Encounter for antineoplastic radiation therapy: Secondary | ICD-10-CM | POA: Diagnosis not present

## 2018-05-15 ENCOUNTER — Ambulatory Visit
Admission: RE | Admit: 2018-05-15 | Discharge: 2018-05-15 | Disposition: A | Discharge status: Home / Self Care | Payer: BLUE CROSS/BLUE SHIELD | Source: Ambulatory Visit | Attending: Radiation Oncology | Admitting: Radiation Oncology

## 2018-05-15 ENCOUNTER — Other Ambulatory Visit: Payer: Self-pay

## 2018-05-15 DIAGNOSIS — Z510 Encounter for antineoplastic radiation therapy: Secondary | ICD-10-CM | POA: Diagnosis not present

## 2018-05-18 ENCOUNTER — Other Ambulatory Visit: Payer: Self-pay

## 2018-05-18 ENCOUNTER — Ambulatory Visit
Admission: RE | Admit: 2018-05-18 | Discharge: 2018-05-18 | Disposition: A | Discharge status: Home / Self Care | Payer: BLUE CROSS/BLUE SHIELD | Source: Ambulatory Visit | Attending: Radiation Oncology | Admitting: Radiation Oncology

## 2018-05-18 DIAGNOSIS — Z510 Encounter for antineoplastic radiation therapy: Secondary | ICD-10-CM | POA: Diagnosis not present

## 2018-05-18 NOTE — Progress Notes (Signed)
Patient Care Team: [name], PA as PCP - General (Family Medicine) [name], MD as Consulting Physician (General Surgery) [name], MD as Consulting Physician (Hematology and Oncology) [name], MD as Consulting Physician ([place]) [name], RN as Oncology Nurse Navigator Rockwell Germany, RN as Oncology Nurse Navigator  DIAGNOSIS:    ICD-10-CM   1. Ductal carcinoma in situ (DCIS) of right breast D05.11     SUMMARY OF ONCOLOGIC HISTORY:   Ductal carcinoma in situ (DCIS) of right breast   01/30/2018 Mammogram    Diagnostic Mammogram  0.4cm cluster of calcifications, 12 o'clock position, 4cm fn, right breast    02/12/2018 Initial Diagnosis    Screening detected right breast calcifications 4 mm size UOQ 11:30 position stereotactic biopsy revealed high-grade DCIS ER 90%, PR 10%, Tis NX stage 0    03/20/2018 Surgery    Right lumpectomy: Scattered microscopic foci of DCIS intermediate grade, margins negative, ER 90%, PR 10%, Tis NX stage 0    04/01/2018 Cancer Staging    Staging form: Breast, AJCC 8th Edition - Pathologic: Stage 0 (pTis (DCIS), pN0, cM0, ER+, PR+) - Signed by Gardenia Phlegm, NP on 04/01/2018    04/29/2018 -  Radiation Therapy    Adjuvant radiation     CHIEF COMPLIANT: Follow-up of radiation to discuss antiestrogen therapy  INTERVAL HISTORY: Jasmine Maddox is a 58 y.o. with above-mentioned history of right breast DCIS who underwent a lumpectomy and is currently undergoing radiation. She presents to the clinic today to discuss antiestrogen therapy with tamoxifen.  She is tolerating radiation fairly well with some radiation dermatitis.  REVIEW OF SYSTEMS:   Constitutional: Denies fevers, chills or abnormal weight loss Eyes: Denies blurriness of vision Ears, nose, mouth, throat, and face: Denies mucositis or sore throat Respiratory: Denies cough, dyspnea or wheezes Cardiovascular: Denies palpitation, chest discomfort  Gastrointestinal: Denies nausea, heartburn or change in bowel habits Skin: Denies abnormal skin rashes Lymphatics: Denies new lymphadenopathy or easy bruising Neurological: Denies numbness, tingling or new weaknesses Behavioral/Psych: Mood is stable, no new changes  Extremities: No lower extremity edema Breast: Radiation dermatitis All other systems were reviewed with the patient and are negative.  I have reviewed the past medical history, past surgical history, social history and family history with the patient and they are unchanged from previous note.  ALLERGIES:  has No Known Allergies.  MEDICATIONS:  Current Outpatient Medications  Medication Sig Dispense Refill  . lisinopril (PRINIVIL,ZESTRIL) 20 MG tablet Take 20 mg by mouth daily.    Derrill Memo ON 06/08/2018] tamoxifen (NOLVADEX) 20 MG tablet Take 1 tablet (20 mg total) by mouth daily. 90 tablet 3   No current facility-administered medications for this visit.    Facility-Administered Medications Ordered in Other Visits  Medication Dose Route Frequency Provider Last Rate Last Dose  . hyaluronate sodium (RADIAPLEXRX) gel   Topical Once [name], MD        PHYSICAL EXAMINATION: ECOG PERFORMANCE STATUS: 1 - Symptomatic but completely ambulatory  Vitals:   05/19/18 0859  BP: 125/73  Pulse: 70  Resp: 18  Temp: 98.7 F (37.1 C)  SpO2: 100%   Filed Weights   05/19/18 0859  Weight: 139 lb 1.6 oz (63.1 kg)    GENERAL: alert, no distress and comfortable SKIN: skin color, texture, turgor are normal, no rashes or significant lesions EYES: normal, Conjunctiva are pink and non-injected, sclera clear OROPHARYNX: no exudate, no erythema and lips, buccal mucosa, and tongue normal  NECK: supple, thyroid normal size, non-tender, without nodularity LYMPH: no palpable lymphadenopathy in the cervical, axillary or inguinal LUNGS: clear to auscultation and percussion with normal breathing effort HEART: regular rate & rhythm and no  murmurs and no lower extremity edema ABDOMEN: abdomen soft, non-tender and normal bowel sounds MUSCULOSKELETAL: no cyanosis of digits and no clubbing  NEURO: alert & oriented x 3 with fluent speech, no focal motor/sensory deficits EXTREMITIES: No lower extremity edema  LABORATORY DATA:  I have reviewed the data as listed CMP Latest Ref Rng & Units 02/18/2018  Glucose 70 - 99 mg/dL 94  BUN 6 - 20 mg/dL 10  Creatinine 0.44 - 1.00 mg/dL 0.77  Sodium 135 - 145 mmol/L 140  Potassium 3.5 - 5.1 mmol/L 4.0  Chloride 98 - 111 mmol/L 106  CO2 22 - 32 mmol/L 27  Calcium 8.9 - 10.3 mg/dL 9.5  Total Protein 6.5 - 8.1 g/dL 7.7  Total Bilirubin 0.3 - 1.2 mg/dL 0.4  Alkaline Phos 38 - 126 U/L 64  AST 15 - 41 U/L 28  ALT 0 - 44 U/L 32    Lab Results  Component Value Date   WBC 4.7 02/18/2018   HGB 12.5 02/18/2018   HCT 37.2 02/18/2018   MCV 88.6 02/18/2018   PLT 183 02/18/2018   NEUTROABS 2.0 02/18/2018    ASSESSMENT & PLAN:  Ductal carcinoma in situ (DCIS) of right breast 03/20/2018:Right lumpectomy: Scattered microscopic foci of DCIS intermediate grade, margins negative, ER 90%, PR 10%, Tis NX stage 0 Adjuvant radiation 04/29/2018-  Treatment plan: Adjuvant tamoxifen 20 mg once daily x5 years Tamoxifen counseling:We discussed the risks and benefits of tamoxifen. These include but not limited to insomnia, hot flashes, mood changes, vaginal dryness, and weight gain. Although rare, serious side effects including endometrial cancer, risk of blood clots were also discussed. We strongly believe that the benefits far outweigh the risks. Patient understands these risks and consented to starting treatment. Planned treatment duration is 5 years.  Return to clinic in 3 months for survivorship care plan visit.      No orders of the defined types were placed in this encounter.  The patient has a good understanding of the overall plan. she agrees with it. she will call with any problems that may  develop before the next visit here.  [name], MD 05/19/2018  Julious Oka Dorshimer am acting as scribe for Dr. [name].  I have reviewed the above documentation for accuracy and completeness, and I agree with the above.

## 2018-05-19 ENCOUNTER — Ambulatory Visit
Admission: RE | Admit: 2018-05-19 | Discharge: 2018-05-19 | Disposition: A | Discharge status: Home / Self Care | Payer: BLUE CROSS/BLUE SHIELD | Source: Ambulatory Visit | Attending: Radiation Oncology | Admitting: Radiation Oncology

## 2018-05-19 ENCOUNTER — Ambulatory Visit: Payer: BLUE CROSS/BLUE SHIELD

## 2018-05-19 ENCOUNTER — Other Ambulatory Visit: Payer: Self-pay

## 2018-05-19 ENCOUNTER — Inpatient Hospital Stay: Payer: BLUE CROSS/BLUE SHIELD | Attending: Hematology and Oncology | Admitting: Hematology and Oncology

## 2018-05-19 DIAGNOSIS — D0511 Intraductal carcinoma in situ of right breast: Secondary | ICD-10-CM

## 2018-05-19 DIAGNOSIS — Z510 Encounter for antineoplastic radiation therapy: Secondary | ICD-10-CM | POA: Diagnosis not present

## 2018-05-19 MED ORDER — TAMOXIFEN CITRATE 20 MG PO TABS: 20.0000 mg | ORAL_TABLET | Freq: Every day | ORAL | 3 refills | Status: DC

## 2018-05-19 MED ORDER — RADIAPLEXRX EX GEL
Freq: Once | CUTANEOUS | Status: AC
  Administered 2018-05-19: 16:00:00 via TOPICAL

## 2018-05-20 ENCOUNTER — Ambulatory Visit: Payer: BLUE CROSS/BLUE SHIELD

## 2018-05-20 ENCOUNTER — Other Ambulatory Visit: Payer: Self-pay

## 2018-05-20 ENCOUNTER — Ambulatory Visit
Admission: RE | Admit: 2018-05-20 | Discharge: 2018-05-20 | Disposition: A | Discharge status: Home / Self Care | Payer: BLUE CROSS/BLUE SHIELD | Source: Ambulatory Visit | Attending: Radiation Oncology | Admitting: Radiation Oncology

## 2018-05-20 DIAGNOSIS — Z510 Encounter for antineoplastic radiation therapy: Secondary | ICD-10-CM | POA: Diagnosis not present

## 2018-05-21 ENCOUNTER — Ambulatory Visit
Admission: RE | Admit: 2018-05-21 | Discharge: 2018-05-21 | Disposition: A | Discharge status: Home / Self Care | Payer: BLUE CROSS/BLUE SHIELD | Source: Ambulatory Visit | Attending: Radiation Oncology | Admitting: Radiation Oncology

## 2018-05-21 ENCOUNTER — Other Ambulatory Visit: Payer: Self-pay

## 2018-05-21 DIAGNOSIS — Z510 Encounter for antineoplastic radiation therapy: Secondary | ICD-10-CM | POA: Diagnosis not present

## 2018-05-22 ENCOUNTER — Other Ambulatory Visit: Payer: Self-pay

## 2018-05-22 ENCOUNTER — Ambulatory Visit
Admission: RE | Admit: 2018-05-22 | Discharge: 2018-05-22 | Disposition: A | Discharge status: Home / Self Care | Payer: BC Managed Care – PPO | Source: Ambulatory Visit | Attending: Radiation Oncology | Admitting: Radiation Oncology

## 2018-05-22 DIAGNOSIS — Z170 Estrogen receptor positive status [ER+]: Secondary | ICD-10-CM | POA: Diagnosis not present

## 2018-05-22 DIAGNOSIS — Z510 Encounter for antineoplastic radiation therapy: Secondary | ICD-10-CM | POA: Insufficient documentation

## 2018-05-22 DIAGNOSIS — D0511 Intraductal carcinoma in situ of right breast: Secondary | ICD-10-CM | POA: Insufficient documentation

## 2018-05-22 NOTE — Progress Notes (Signed)
Spoke with patient via phone she had drive thru testing done with no problems and had spoken with her primary physician to get her medicine called in. No other issues at this time.

## 2018-05-25 ENCOUNTER — Other Ambulatory Visit: Payer: Self-pay

## 2018-05-25 ENCOUNTER — Encounter: Payer: Self-pay | Admitting: *Deleted

## 2018-05-25 ENCOUNTER — Ambulatory Visit: Payer: BC Managed Care – PPO

## 2018-05-25 ENCOUNTER — Ambulatory Visit
Admission: RE | Admit: 2018-05-25 | Discharge: 2018-05-25 | Disposition: A | Discharge status: Home / Self Care | Payer: BC Managed Care – PPO | Source: Ambulatory Visit | Attending: Radiation Oncology | Admitting: Radiation Oncology

## 2018-05-25 DIAGNOSIS — Z510 Encounter for antineoplastic radiation therapy: Secondary | ICD-10-CM | POA: Diagnosis not present

## 2018-05-26 ENCOUNTER — Other Ambulatory Visit: Payer: Self-pay

## 2018-05-26 ENCOUNTER — Ambulatory Visit
Admission: RE | Admit: 2018-05-26 | Discharge: 2018-05-26 | Disposition: A | Discharge status: Home / Self Care | Payer: BC Managed Care – PPO | Source: Ambulatory Visit | Attending: Radiation Oncology | Admitting: Radiation Oncology

## 2018-05-26 DIAGNOSIS — Z510 Encounter for antineoplastic radiation therapy: Secondary | ICD-10-CM | POA: Diagnosis not present

## 2018-05-27 ENCOUNTER — Encounter: Payer: Self-pay | Admitting: Radiation Oncology

## 2018-05-27 NOTE — Progress Notes (Signed)
  Radiation Oncology         [phone]) [phone] ________________________________  Name: Jasmine Maddox MRN: [id number]  Date: 05/27/2018  DOB: 1961-01-12  End of Treatment Note  Diagnosis:   High-grade ductal carcinoma in situ of the right breast, ER+, PR+     Indication for treatment:  Curative       Radiation treatment dates:   04/28/18-05/26/18  Site/dose:   1. Right breast; 15 fractions of 2.67 Gy for a total of 40.05 Gy           2. Boost; 5 fractions of 2 Gy for a total of 10 Gy  Beams/energy:   1. 3D Photon; 6X, 10X        2. 3D Photon; 9E  Narrative: The patient tolerated radiation treatment relatively well.     Pt denied pain and fatigue throughout treatments. Pt developed hyperpigmentation to the right breast and mild swelling. She endorsed using Radiaplex skin cream as directed. Overall the pt was without complaints.   Plan: The patient has completed radiation treatment. The patient will return to radiation oncology clinic for routine followup in one month. I advised them to call or return sooner if they have any questions or concerns related to their recovery or treatment.  -----------------------------------  Blair Promise, PhD, MD  This document serves as a record of services personally performed by [name], MD. It was created on his behalf by Mary-Margaret Loma Messing, a trained medical scribe. The creation of this record is based on the scribe's personal observations and the provider's statements to them. This document has been checked and approved by the attending provider.

## 2018-06-29 ENCOUNTER — Encounter: Payer: Self-pay | Admitting: Radiation Oncology

## 2018-06-29 ENCOUNTER — Ambulatory Visit
Admission: RE | Admit: 2018-06-29 | Discharge: 2018-06-29 | Disposition: A | Discharge status: Home / Self Care | Payer: BC Managed Care – PPO | Source: Ambulatory Visit | Attending: Radiation Oncology | Admitting: Radiation Oncology

## 2018-06-29 ENCOUNTER — Other Ambulatory Visit: Payer: Self-pay

## 2018-06-29 VITALS — BP 144/78 | HR 67 | Temp 98.7°F | Resp 18 | Ht 63.0 in | Wt 138.2 lb

## 2018-06-29 DIAGNOSIS — Z79899 Other long term (current) drug therapy: Secondary | ICD-10-CM | POA: Diagnosis not present

## 2018-06-29 DIAGNOSIS — Z170 Estrogen receptor positive status [ER+]: Secondary | ICD-10-CM | POA: Insufficient documentation

## 2018-06-29 DIAGNOSIS — D0511 Intraductal carcinoma in situ of right breast: Secondary | ICD-10-CM | POA: Diagnosis not present

## 2018-06-29 DIAGNOSIS — Z79810 Long term (current) use of selective estrogen receptor modulators (SERMs): Secondary | ICD-10-CM | POA: Diagnosis not present

## 2018-06-29 DIAGNOSIS — Z923 Personal history of irradiation: Secondary | ICD-10-CM | POA: Insufficient documentation

## 2018-06-29 NOTE — Progress Notes (Signed)
Radiation Oncology         [phone]) [phone] ________________________________  Name: Jasmine Maddox MRN: [id number]  Date: 06/29/2018  DOB: [birth year]  Follow-Up Visit Note  CC: Jasmine Huxley, PA  Jasmine Lose, MD    ICD-10-CM   1. 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 Right breast; 15 fractions of 2.67 Gy for a total of 40.05 Gy                      2. Boost; 5 fractions of 2 Gy for a total of 10 Gy  Narrative:  The patient returns today for routine follow-up.  she is doing well overall.   On review of systems, pt reports lingering fatigue but is improving. Pt denies c/o pain. Breast is very slightly hyperpigmented. Pt is not using any particular skin moisturizer. Pertinent positives are listed and detailed within the above HPI.                 ALLERGIES:  has No Known Allergies.  Meds: Current Outpatient Medications  Medication Sig Dispense Refill  . lisinopril (PRINIVIL,ZESTRIL) 20 MG tablet Take 20 mg by mouth daily.    . tamoxifen (NOLVADEX) 20 MG tablet Take 1 tablet (20 mg total) by mouth daily. 90 tablet 3   No current facility-administered medications for this encounter.     Physical Findings: The patient is in no acute distress. Patient is alert and oriented.  height is 5\' 3"  (1.6 m) and weight is 138 lb 4 oz (62.7 kg). Her temporal temperature is 98.7 F (37.1 C). Her blood pressure is 144/78 (abnormal) and her pulse is 67. Her respiration is 18 and oxygen saturation is 100%. .  No significant changes. Lungs are clear to auscultation bilaterally. Heart has regular rate and rhythm. No palpable cervical, supraclavicular, or axillary adenopathy. Abdomen soft, non-tender, normal bowel sounds. Left breast with no palpable mass, nipple discharge, or bleeding. Right  breast with well-healed skin and mild hyperpigmentation changes. No dominant mass appreciated in the breast, nipple discharge or bleeding.    Lab Findings: Lab Results  Component Value Date   WBC 4.7 02/18/2018   HGB 12.5 02/18/2018   HCT 37.2 02/18/2018   MCV 88.6 02/18/2018   PLT 183 02/18/2018    Radiographic Findings: No results found.  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 No evidence of recurrence on clinical exam.  The pt is doing well without  significant residual side effects from her tx. She has started Tamoxifen which has caused some worsening of her nights sweats and hot flashes but seems to be manageable at this time.   Plan:  PRN follow-up in radiation oncology. She will continue to follow-up with medical oncology as well as surgery and is scheduled for survivorship evaluation in approximately 3 months.   ____________________________________   Jasmine Promise, PhD, MD    This document serves as a record of services personally performed by Jasmine Pray, MD. It was created on his behalf by Jasmine Maddox, a trained medical scribe. The creation of this record is based on the scribe's personal observations and the provider's statements to them. This document has been checked and approved by the attending provider.

## 2018-06-29 NOTE — Progress Notes (Signed)
Pt presents today for f/u with Dr. Sondra Come. Pt reports lingering fatigue but is improving. Pt denies c/o pain. Breast is very slightly hyperpigmented. Pt is not using any particular skin moisturizer.  BP (!) 144/78 (BP Location: Left Arm, Patient Position: Sitting)   Pulse 67   Temp 98.7 F (37.1 C) (Temporal)   Resp 18   Ht 5\' 3"  (1.6 m)   Wt 138 lb 4 oz (62.7 kg)   SpO2 100%   BMI 24.49 kg/m   Wt Readings from Last 3 Encounters:  06/29/18 138 lb 4 oz (62.7 kg)  05/19/18 139 lb 1.6 oz (63.1 kg)  04/08/18 139 lb 6 oz (63.2 kg)   Loma Sousa, RN BSN

## 2018-06-29 NOTE — Patient Instructions (Signed)
Coronavirus (COVID-19) Are you at risk?  Are you at risk for the Coronavirus (COVID-19)?  To be considered HIGH RISK for Coronavirus (COVID-19), you have to meet the following criteria:  . Traveled to [place], [place], [place], [place] or [place]; or in the [place] to [place], [place], [place], or [place]; and have fever, cough, and shortness of breath within the last 2 weeks of travel OR . Been in close contact with a person diagnosed with COVID-19 within the last 2 weeks and have fever, cough, and shortness of breath . IF YOU DO NOT MEET THESE CRITERIA, YOU ARE CONSIDERED LOW RISK FOR COVID-19.  What to do if you are HIGH RISK for COVID-19?  . If you are having a medical emergency, call 911. . Seek medical care right away. Before you go to a doctor's office, urgent care or emergency department, call ahead and tell them about your recent travel, contact with someone diagnosed with COVID-19, and your symptoms. You should receive instructions from your physician's office regarding next steps of care.  . When you arrive at healthcare provider, tell the healthcare staff immediately you have returned from visiting [place], [place], [place], [place] or [place]; or traveled in the [place] to [place], [place], [place], or [place]; in the last two weeks or you have been in close contact with a person diagnosed with COVID-19 in the last 2 weeks.   . Tell the health care staff about your symptoms: fever, cough and shortness of breath. . After you have been seen by a medical provider, you will be either: o Tested for (COVID-19) and discharged home on quarantine except to seek medical care if symptoms worsen, and asked to  - Stay home and avoid contact with others until you get your results (4-5 days)  - Avoid travel on public transportation if possible (such as bus, train, or airplane) or o Sent to the Emergency Department by EMS for evaluation, COVID-19 testing, and possible  admission depending on your condition and test results.  What to do if you are LOW RISK for COVID-19?  Reduce your risk of any infection by using the same precautions used for avoiding the common cold or flu:  . Wash your hands often with soap and warm water for at least 20 seconds.  If soap and water are not readily available, use an alcohol-based hand sanitizer with at least 60% alcohol.  . If coughing or sneezing, cover your mouth and nose by coughing or sneezing into the elbow areas of your shirt or coat, into a tissue or into your sleeve (not your hands). . Avoid shaking hands with others and consider head nods or verbal greetings only. . Avoid touching your eyes, nose, or mouth with unwashed hands.  . Avoid close contact with people who are sick. . Avoid places or events with large numbers of people in one location, like concerts or sporting events. . Carefully consider travel plans you have or are making. . If you are planning any travel outside or inside the [place], visit the CDC's Travelers' Health webpage for the latest health notices. . If you have some symptoms but not all symptoms, continue to monitor at home and seek medical attention if your symptoms worsen. . If you are having a medical emergency, call 911.   ADDITIONAL HEALTHCARE OPTIONS FOR PATIENTS  [facility] / e-Visit: [email or web address]         [facility]: [phone]  [place]   [facility]: [phone]                   [facility]: [phone]   

## 2018-08-03 ENCOUNTER — Telehealth: Payer: Self-pay | Admitting: Adult Health

## 2018-08-03 NOTE — Telephone Encounter (Signed)
I left a message regarding video visit  °

## 2018-08-27 ENCOUNTER — Telehealth: Payer: Self-pay | Admitting: Adult Health

## 2018-08-27 ENCOUNTER — Telehealth: Payer: Self-pay

## 2018-08-27 ENCOUNTER — Inpatient Hospital Stay: Payer: BC Managed Care – PPO | Attending: Adult Health | Admitting: Adult Health

## 2018-08-27 ENCOUNTER — Encounter: Payer: Self-pay | Admitting: Adult Health

## 2018-08-27 DIAGNOSIS — D0511 Intraductal carcinoma in situ of right breast: Secondary | ICD-10-CM

## 2018-08-27 DIAGNOSIS — Z79810 Long term (current) use of selective estrogen receptor modulators (SERMs): Secondary | ICD-10-CM

## 2018-08-27 DIAGNOSIS — Z923 Personal history of irradiation: Secondary | ICD-10-CM

## 2018-08-27 DIAGNOSIS — Z170 Estrogen receptor positive status [ER+]: Secondary | ICD-10-CM

## 2018-08-27 NOTE — Progress Notes (Signed)
SURVIVORSHIP VIRTUAL VISIT:  I connected with Jasmine Maddox on 08/27/18 at 10:00 AM EDT by my chart video and verified that I am speaking with the correct person using two identifiers.   I discussed the limitations, risks, security and privacy concerns of performing an evaluation and management service by video and the availability of in person appointments. I also discussed with the patient that there may be a patient responsible charge related to this service. The patient expressed understanding and agreed to proceed.   BRIEF ONCOLOGIC HISTORY:  Oncology History  Ductal carcinoma in situ (DCIS) of right breast  01/30/2018 Mammogram   Diagnostic Mammogram  0.4cm cluster of calcifications, 12 o'clock position, 4cm fn, right breast   02/12/2018 Initial Diagnosis   Screening detected right breast calcifications 4 mm size UOQ 11:30 position stereotactic biopsy revealed high-grade DCIS ER 90%, PR 10%, Tis NX stage 0   02/18/2018 Genetic Testing   Negative.  Genes tested include: ATM, BRCA1, BRCA2, CDH1, CHEK2, PALB2, PTEN, STK11 and TP53; APC, ATM, AXIN2, BARD1, BMPR1A, BRCA1, BRCA2, BRIP1, CDH1, CDKN2A (p14ARF), CDKN2A (p16INK4a), CKD4, CHEK2, CTNNA1, DICER1, EPCAM (Deletion/duplication testing only), GREM1 (promoter region deletion/duplication testing only), KIT, MEN1, MLH1, MSH2, MSH3, MSH6, MUTYH, NBN, NF1, NHTL1, PALB2, PDGFRA, PMS2, POLD1, POLE, PTEN, RAD50, RAD51C, RAD51D, SDHB, SDHC, SDHD, SMAD4, SMARCA4. STK11, TP53, TSC1, TSC2, and VHL.  The following genes were evaluated for sequence changes only: SDHA and HOXB13 c.251G>A variant only.   03/20/2018 Surgery   Right lumpectomy: Scattered microscopic foci of DCIS intermediate grade, margins negative, ER 90%, PR 10%, Tis NX stage 0   04/01/2018 Cancer Staging   Staging form: Breast, AJCC 8th Edition - Pathologic: Stage 0 (pTis (DCIS), pN0, cM0, ER+, PR+) - Signed by Gardenia Phlegm, NP on 04/01/2018   04/28/2018 - 05/26/2018 Radiation  Therapy   Adjuvant radiation 1. Right breast; 15 fractions of 2.67 Gy for a total of 40.05 Gy 2. Boost; 5 fractions of 2 Gy for a total of 10 Gy     05/2018 -  Anti-estrogen oral therapy   Tamoxifen daily     INTERVAL HISTORY:  Jasmine Maddox to review her survivorship care plan detailing her treatment course for breast cancer, as well as monitoring long-term side effects of that treatment, education regarding health maintenance, screening, and overall wellness and health promotion.     Overall, Jasmine Maddox reports feeling quite well.  She is taking Tamoxifen daily.  She has some hot flashes intermittently that she is managing.    REVIEW OF SYSTEMS:  Review of Systems  Constitutional: Negative for appetite change, chills, fatigue, fever and unexpected weight change.  HENT:   Negative for hearing loss, lump/mass, nosebleeds and trouble swallowing.   Eyes: Negative for eye problems and icterus.  Respiratory: Negative for chest tightness, cough and shortness of breath.   Cardiovascular: Negative for chest pain, leg swelling and palpitations.  Gastrointestinal: Negative for abdominal distention, abdominal pain, constipation, diarrhea, nausea and vomiting.  Endocrine: Negative for hot flashes.  Musculoskeletal: Negative for arthralgias.  Skin: Negative for itching and rash.  Neurological: Negative for dizziness, extremity weakness, headaches and numbness.  Hematological: Negative for adenopathy. Does not bruise/bleed easily.  Psychiatric/Behavioral: Negative for depression. The patient is not nervous/anxious.   Breast: Denies any new nodularity, masses, tenderness, nipple changes, or nipple discharge.      ONCOLOGY TREATMENT TEAM:  1. Surgeon:  Dr. Dalbert Batman at [facility] 2. Medical Oncologist: Dr. Lindi Adie  3. Radiation Oncologist: Dr. Sondra Come  PAST MEDICAL/SURGICAL HISTORY:  Past Medical History:  Diagnosis Date  . Cervical cancer ([place])   . Family history of breast  cancer   . Hypertension    Past Surgical History:  Procedure Laterality Date  . BREAST LUMPECTOMY WITH RADIOACTIVE SEED LOCALIZATION Right 03/20/2018   Procedure: RIGHT BREAST LUMPECTOMY WITH RADIOACTIVE SEED LOCALIZATION;  Surgeon: [name], MD;  Location: [facility];  Service: General;  Laterality: Right;  . CHOLECYSTECTOMY       ALLERGIES:  No Known Allergies   CURRENT MEDICATIONS:  Outpatient Encounter Medications as of 08/27/2018  Medication Sig  . lisinopril (PRINIVIL,ZESTRIL) 20 MG tablet Take 20 mg by mouth daily.  . tamoxifen (NOLVADEX) 20 MG tablet Take 1 tablet (20 mg total) by mouth daily.   No facility-administered encounter medications on file as of 08/27/2018.      ONCOLOGIC FAMILY HISTORY:  Family History  Problem Relation Age of Onset  . Breast cancer Mother 52       d. 6  . Breast cancer Sister 32       triple negative, "negative genetic testing 2019"     GENETIC COUNSELING/TESTING: See above  SOCIAL HISTORY:  Social History   Socioeconomic History  . Marital status: Married    Spouse name: Not on file  . Number of children: Not on file  . Years of education: Not on file  . Highest education level: Not on file  Occupational History  . Not on file  Social Needs  . Financial resource strain: Not on file  . Food insecurity    Worry: Not on file    Inability: Not on file  . Transportation needs    Medical: Not on file    Non-medical: Not on file  Tobacco Use  . Smoking status: Never Smoker  . Smokeless tobacco: Never Used  Substance and Sexual Activity  . Alcohol use: Yes    Comment: socially  . Drug use: Never  . Sexual activity: Not on file  Lifestyle  . Physical activity    Days per week: Not on file    Minutes per session: Not on file  . Stress: Not on file  Relationships  . Social Herbalist on phone: Not on file    Gets together: Not on file    Attends religious service: Not on file    Active  member of club or organization: Not on file    Attends meetings of clubs or organizations: Not on file    Relationship status: Not on file  . Intimate partner violence    Fear of current or ex partner: Not on file    Emotionally abused: Not on file    Physically abused: Not on file    Forced sexual activity: Not on file  Other Topics Concern  . Not on file  Social History Narrative  . Not on file     OBSERVATIONS/OBJECTIVE:  Patient is in no apparent distress.  Mod and behavior are normal.  Speech is non pressured.  Breathing is non labored.    LABORATORY DATA:  None for this visit.  DIAGNOSTIC IMAGING:  None for this visit.     ASSESSMENT AND PLAN:  Ms.. Degrazia is a pleasant 58 y.o. female with Stage 0 right breast DCIS, ER+/PR+, diagnosed in 01/2018, treated with lumpectomy, adjuvant radiation therapy, and anti-estrogen therapy with Tamoxifen beginning in 05/2018.  She presents to the Survivorship Clinic for our initial meeting and routine follow-up post-completion  of treatment for breast cancer.    1. Stage 0 right breast cancer:  Ms. Finerty is continuing to recover from definitive treatment for breast cancer. She will follow-up with her medical oncologist, Dr. Lindi Adie in 01/2018 with history and physical exam per surveillance protocol.  She will continue her anti-estrogen therapy with Tamoxifen. Thus far, she is tolerating the Tamoxifen well, with minimal side effects.  Her mammogram is due 01/2019; orders placed today. Today, a comprehensive survivorship care plan and treatment summary was reviewed with the patient today detailing her breast cancer diagnosis, treatment course, potential late/long-term effects of treatment, appropriate follow-up care with recommendations for the future, and patient education resources.  A copy of this summary, along with a letter will be sent to the patient's primary care provider via mail/fax/In Basket message after today's visit.    2. Bone  health:  Given Ms. Chasteen's age/history of breast cancer, she is at slight risk for bone demineralization.  I counseled her that Tamoxifen can have a protective effect on the bones. She was given education on specific activities to promote bone health.  3. Cancer screening:  Due to Ms. Gibeault's history and her age, she should receive screening for skin cancers, colon cancer, and gynecologic cancers.  The information and recommendations are listed on the patient's comprehensive care plan/treatment summary and were reviewed in detail with the patient.    4. Health maintenance and wellness promotion: Ms. Shader was encouraged to consume 5-7 servings of fruits and vegetables per day. We reviewed the "Nutrition Rainbow" handout, as well as the handout "Take Control of Your Health and Reduce Your Cancer Risk" from the [facility].  She was also encouraged to engage in moderate to vigorous exercise for 30 minutes per day most days of the week. We discussed the [employer] fitness program, which is designed for cancer survivors to help them become more physically fit after cancer treatments.  She was instructed to limit her alcohol consumption and continue to abstain from tobacco use.     5. Support services/counseling: It is not uncommon for this period of the patient's cancer care trajectory to be one of many emotions and stressors.  We discussed how this can be increasingly difficult during the times of quarantine and social distancing due to the COVID-19 pandemic.   She was given information regarding our available services and encouraged to contact me with any questions or for help enrolling in any of our support group/programs.    Follow up instructions:    -Return to cancer center for f/u with Dr. Lindi Adie in 01/2019 -Mammogram due in 01/2019 -Follow up with Dr. Dalbert Batman 07/2019 -She is welcome to return back to the Survivorship Clinic at any time; no additional follow-up needed at this  time.  -Consider referral back to survivorship as a long-term survivor for continued surveillance  The patient was provided an opportunity to ask questions and all were answered. The patient agreed with the plan and demonstrated an understanding of the instructions.   The patient was advised to call back or seek an in-person evaluation if the symptoms worsen or if the condition fails to improve as anticipated.   I provided 25 minutes of face-to-face video visit time during this encounter, and > 50% was spent counseling as documented under my assessment & plan.  Scot Dock, NP

## 2018-08-27 NOTE — Telephone Encounter (Signed)
Diagnostic mammogram order faxed to [facility] for patient.

## 2018-08-27 NOTE — Telephone Encounter (Signed)
I talk with patient regarding schedule  

## 2018-11-16 ENCOUNTER — Other Ambulatory Visit (HOSPITAL_COMMUNITY)
Admission: RE | Admit: 2018-11-16 | Discharge: 2018-11-16 | Disposition: A | Discharge status: Home / Self Care | Payer: BC Managed Care – PPO | Source: Ambulatory Visit | Attending: Family Medicine | Admitting: Family Medicine

## 2018-11-16 ENCOUNTER — Other Ambulatory Visit: Payer: Self-pay | Admitting: Family Medicine

## 2018-11-16 DIAGNOSIS — Z0000 Encounter for general adult medical examination without abnormal findings: Secondary | ICD-10-CM | POA: Diagnosis not present

## 2018-11-16 DIAGNOSIS — Z124 Encounter for screening for malignant neoplasm of cervix: Secondary | ICD-10-CM | POA: Insufficient documentation

## 2018-11-24 LAB — CYTOLOGY - PAP
Comment: NEGATIVE
Diagnosis: NEGATIVE
High risk HPV: NEGATIVE

## 2018-12-29 ENCOUNTER — Encounter: Payer: Self-pay | Admitting: *Deleted

## 2019-02-14 NOTE — Progress Notes (Signed)
Patient Care Team: [name], PA as PCP - General (Family Medicine) [name], MD as Consulting Physician (General Surgery) [name], MD as Consulting Physician (Hematology and Oncology) [name], MD as Consulting Physician (Radiation Oncology)  DIAGNOSIS:    ICD-10-CM   1. Ductal carcinoma in situ (DCIS) of right breast  D05.11     SUMMARY OF ONCOLOGIC HISTORY: Oncology History  Ductal carcinoma in situ (DCIS) of right breast  01/30/2018 Mammogram   Diagnostic Mammogram  0.4cm cluster of calcifications, 12 o'clock position, 4cm fn, right breast   02/12/2018 Initial Diagnosis   Screening detected right breast calcifications 4 mm size UOQ 11:30 position stereotactic biopsy revealed high-grade DCIS ER 90%, PR 10%, Tis NX stage 0   02/18/2018 Genetic Testing   Negative.  Genes tested include: ATM, BRCA1, BRCA2, CDH1, CHEK2, PALB2, PTEN, STK11 and TP53; APC, ATM, AXIN2, BARD1, BMPR1A, BRCA1, BRCA2, BRIP1, CDH1, CDKN2A (p14ARF), CDKN2A (p16INK4a), CKD4, CHEK2, CTNNA1, DICER1, EPCAM (Deletion/duplication testing only), GREM1 (promoter region deletion/duplication testing only), KIT, MEN1, MLH1, MSH2, MSH3, MSH6, MUTYH, NBN, NF1, NHTL1, PALB2, PDGFRA, PMS2, POLD1, POLE, PTEN, RAD50, RAD51C, RAD51D, SDHB, SDHC, SDHD, SMAD4, SMARCA4. STK11, TP53, TSC1, TSC2, and VHL.  The following genes were evaluated for sequence changes only: SDHA and HOXB13 c.251G>A variant only.   03/20/2018 Surgery   Right lumpectomy: Scattered microscopic foci of DCIS intermediate grade, margins negative, ER 90%, PR 10%, Tis NX stage 0   04/01/2018 Cancer Staging   Staging form: Breast, AJCC 8th Edition - Pathologic: Stage 0 (pTis (DCIS), pN0, cM0, ER+, PR+) - Signed by Gardenia Phlegm, NP on 04/01/2018   04/28/2018 - 05/26/2018 Radiation Therapy   Adjuvant radiation 1. Right breast; 15 fractions of 2.67 Gy for a total of 40.05 Gy 2. Boost; 5 fractions of 2 Gy for a total of 10 Gy     05/2018 -   Anti-estrogen oral therapy   Tamoxifen daily     CHIEF COMPLIANT:  Follow-up of radiation to discuss antiestrogen therapy  INTERVAL HISTORY: Jasmine Maddox is a 59 y.o. with above-mentioned history of right breast DCIS who underwent a lumpectomy, radiation, and who is currently on antiestrogen therapy with tamoxifen. Mammogram on 01/13/19 showed no evidence of malignancy bilaterally. She presents to the clinic today for annual follow-up.  Over the past month she is complaining of worsening right breast pain.  It is gotten so worse that she is having to have difficulty with sleeping.  ALLERGIES:  has No Known Allergies.  MEDICATIONS:  Current Outpatient Medications  Medication Sig Dispense Refill  . lisinopril (PRINIVIL,ZESTRIL) 20 MG tablet Take 20 mg by mouth daily.    . tamoxifen (NOLVADEX) 20 MG tablet Take 1 tablet (20 mg total) by mouth daily. 90 tablet 3   No current facility-administered medications for this visit.    PHYSICAL EXAMINATION: ECOG PERFORMANCE STATUS: 1 - Symptomatic but completely ambulatory  There were no vitals filed for this visit. Filed Weights   02/15/19 1013  Weight: 132 lb 4.8 oz (60 kg)    BREAST: Right breast pain and tenderness without any palpable lumps or nodules.  (exam performed in the presence of a chaperone)  LABORATORY DATA:  I have reviewed the data as listed CMP Latest Ref Rng & Units 02/18/2018  Glucose 70 - 99 mg/dL 94  BUN 6 - 20 mg/dL 10  Creatinine 0.44 - 1.00 mg/dL 0.77  Sodium 135 - 145 mmol/L 140  Potassium 3.5 - 5.1 mmol/L 4.0  Chloride 98 -  111 mmol/L 106  CO2 22 - 32 mmol/L 27  Calcium 8.9 - 10.3 mg/dL 9.5  Total Protein 6.5 - 8.1 g/dL 7.7  Total Bilirubin 0.3 - 1.2 mg/dL 0.4  Alkaline Phos 38 - 126 U/L 64  AST 15 - 41 U/L 28  ALT 0 - 44 U/L 32    Lab Results  Component Value Date   WBC 4.7 02/18/2018   HGB 12.5 02/18/2018   HCT 37.2 02/18/2018   MCV 88.6 02/18/2018   PLT 183 02/18/2018   NEUTROABS 2.0  02/18/2018    ASSESSMENT & PLAN:  Ductal carcinoma in situ (DCIS) of right breast 03/20/2018:Right lumpectomy: Scattered microscopic foci of DCIS intermediate grade, margins negative, ER 90%, PR 10%, Tis NX stage 0 Adjuvant radiation 04/29/2018-05/26/2018  Treatment plan: Adjuvant tamoxifen 20 mg once daily x5 years started May 2020 Tamoxifen toxicities: Occasional hot flashes but they are no different than previously.  Right breast discomfort: I suspect this is radiation recall.  No palpable lumps or nodules.  She recently had a mammogram and therefore we decided not to do any further investigations at this time.  We discussed the role of essential oils and CBD oil for pain relief as well as turmeric and Tylenol or Motrin for anti-inflammatory effect.  Breast cancer surveillance: 1.  Breast exam 02/15/2019: Benign 2.  Mammogram 01/13/2019 at [facility]: Benign breast density category C  She is expecting her first grandchild this year.  She is very excited about it.  Return to clinic in 1 year for follow-up    No orders of the defined types were placed in this encounter.  The patient has a good understanding of the overall plan. she agrees with it. she will call with any problems that may develop before the next visit here.  Total time spent: 15 mins including face to face time and time spent for planning, charting and coordination of care  [name], MD 02/15/2019  I, Cloyde Reams Dorshimer, am acting as scribe for Dr. [name].  I have reviewed the above documentation for accuracy and completeness, and I agree with the above.

## 2019-02-15 ENCOUNTER — Other Ambulatory Visit: Payer: Self-pay

## 2019-02-15 ENCOUNTER — Inpatient Hospital Stay: Payer: BC Managed Care – PPO | Attending: Hematology and Oncology | Admitting: Hematology and Oncology

## 2019-02-15 DIAGNOSIS — N644 Mastodynia: Secondary | ICD-10-CM | POA: Diagnosis not present

## 2019-02-15 DIAGNOSIS — D0511 Intraductal carcinoma in situ of right breast: Secondary | ICD-10-CM | POA: Insufficient documentation

## 2019-02-15 DIAGNOSIS — Z923 Personal history of irradiation: Secondary | ICD-10-CM | POA: Diagnosis not present

## 2019-02-15 MED ORDER — TAMOXIFEN CITRATE 20 MG PO TABS: 20.0000 mg | ORAL_TABLET | Freq: Every day | ORAL | 3 refills | Status: DC

## 2019-02-15 NOTE — Assessment & Plan Note (Signed)
03/20/2018:Right lumpectomy: Scattered microscopic foci of DCIS intermediate grade, margins negative, ER 90%, PR 10%, Tis NX stage 0 Adjuvant radiation 04/29/2018-  Treatment plan: Adjuvant tamoxifen 20 mg once daily x5 years Tamoxifen toxicities:  Breast cancer surveillance: 1.  Breast exam 02/15/2019: Benign 2.  Mammogram 01/13/2019 at [facility]: Benign breast density category C  Return to clinic in 1 year for follow-up

## 2019-06-23 ENCOUNTER — Telehealth: Payer: Self-pay | Admitting: *Deleted

## 2019-06-23 NOTE — Telephone Encounter (Signed)
Received call from pt with complaint of increased pain in right breast x 1 week.  Pt denies recent trauma or injury. Per MD pt with hx of right lumpectomy and needing to be seen by surgeon for further evaluation and treatment.  Pt verbalized understanding and appreciative of the advice.

## 2020-03-31 ENCOUNTER — Telehealth: Payer: Self-pay | Admitting: Hematology and Oncology

## 2020-03-31 ENCOUNTER — Other Ambulatory Visit: Payer: Self-pay | Admitting: Hematology and Oncology

## 2020-03-31 NOTE — Telephone Encounter (Signed)
Scheduled appt per 3/11 sch msg. Pt aware.

## 2020-05-18 ENCOUNTER — Telehealth: Payer: Self-pay | Admitting: Hematology and Oncology

## 2020-05-18 NOTE — Telephone Encounter (Signed)
R/s appt per 4/28 sch msg. Pt aware.  

## 2020-05-22 ENCOUNTER — Ambulatory Visit: Payer: BC Managed Care – PPO | Admitting: Hematology and Oncology

## 2020-07-07 ENCOUNTER — Ambulatory Visit: Payer: BC Managed Care – PPO | Admitting: Hematology and Oncology

## 2021-01-18 ENCOUNTER — Encounter: Payer: Self-pay | Admitting: Adult Health

## 2021-01-18 DIAGNOSIS — N6315 Unspecified lump in the right breast, overlapping quadrants: Secondary | ICD-10-CM | POA: Diagnosis not present

## 2021-01-18 DIAGNOSIS — R922 Inconclusive mammogram: Secondary | ICD-10-CM | POA: Diagnosis not present

## 2021-01-19 ENCOUNTER — Other Ambulatory Visit: Payer: Self-pay

## 2021-01-19 DIAGNOSIS — R590 Localized enlarged lymph nodes: Secondary | ICD-10-CM | POA: Diagnosis not present

## 2021-01-19 DIAGNOSIS — C50512 Malignant neoplasm of lower-outer quadrant of left female breast: Secondary | ICD-10-CM | POA: Diagnosis not present

## 2021-01-19 DIAGNOSIS — C50412 Malignant neoplasm of upper-outer quadrant of left female breast: Secondary | ICD-10-CM | POA: Diagnosis not present

## 2021-01-19 DIAGNOSIS — Z170 Estrogen receptor positive status [ER+]: Secondary | ICD-10-CM | POA: Diagnosis not present

## 2021-01-29 DIAGNOSIS — C50912 Malignant neoplasm of unspecified site of left female breast: Secondary | ICD-10-CM | POA: Diagnosis not present

## 2021-01-29 DIAGNOSIS — I10 Essential (primary) hypertension: Secondary | ICD-10-CM | POA: Diagnosis not present

## 2021-01-29 DIAGNOSIS — F439 Reaction to severe stress, unspecified: Secondary | ICD-10-CM | POA: Diagnosis not present

## 2021-01-30 ENCOUNTER — Telehealth: Payer: Self-pay | Admitting: Hematology and Oncology

## 2021-01-30 NOTE — Telephone Encounter (Signed)
Sch per 1/9 inbasket, pt aware °

## 2021-02-01 ENCOUNTER — Other Ambulatory Visit (HOSPITAL_COMMUNITY): Payer: Self-pay | Admitting: General Surgery

## 2021-02-01 ENCOUNTER — Other Ambulatory Visit: Payer: Self-pay | Admitting: General Surgery

## 2021-02-01 DIAGNOSIS — C50412 Malignant neoplasm of upper-outer quadrant of left female breast: Secondary | ICD-10-CM

## 2021-02-01 DIAGNOSIS — Z170 Estrogen receptor positive status [ER+]: Secondary | ICD-10-CM | POA: Diagnosis not present

## 2021-02-06 ENCOUNTER — Telehealth: Payer: Self-pay | Admitting: Hematology and Oncology

## 2021-02-06 NOTE — Telephone Encounter (Signed)
Sch per 1/16 inbasket, left  msg

## 2021-02-07 ENCOUNTER — Ambulatory Visit: Payer: BC Managed Care – PPO | Admitting: Hematology and Oncology

## 2021-02-09 ENCOUNTER — Other Ambulatory Visit: Payer: Self-pay

## 2021-02-09 ENCOUNTER — Ambulatory Visit
Admission: RE | Admit: 2021-02-09 | Discharge: 2021-02-09 | Disposition: A | Discharge status: Home / Self Care | Payer: BC Managed Care – PPO | Source: Ambulatory Visit | Attending: General Surgery | Admitting: General Surgery

## 2021-02-09 DIAGNOSIS — C50412 Malignant neoplasm of upper-outer quadrant of left female breast: Secondary | ICD-10-CM

## 2021-02-09 DIAGNOSIS — Z170 Estrogen receptor positive status [ER+]: Secondary | ICD-10-CM

## 2021-02-09 DIAGNOSIS — C50912 Malignant neoplasm of unspecified site of left female breast: Secondary | ICD-10-CM | POA: Diagnosis not present

## 2021-02-09 IMAGING — MR MR BREAST BILAT WO/W CM
8 of 12 series · 32 of 48 positions shown · IV contrast (7ML GADAVIST)
Comparison: Previous exam(s).

CLINICAL DATA: 67-year-old female with newly diagnosed left breast
cancer.

EXAM:
BILATERAL BREAST MRI WITH AND WITHOUT CONTRAST
TECHNIQUE: Multiplanar, multisequence MR images of both breasts were obtained
prior to and following the intravenous administration of 7 ml of
Gadavist.

[Series 2: t2_tirm_tra ipat (a-p) · axial · 3.0mm · 0.70mm/px · 1 of 62 slices shown]
[im 1/62]
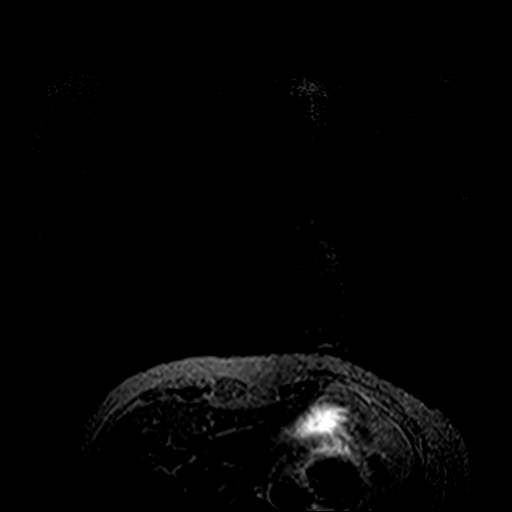

[Series 3: fl3d pre-cm no · axial · non-contrast · 1.2mm · 0.89mm/px · z∈[-122,+69]mm · 5 of 160 slices shown]
[im 1/160]
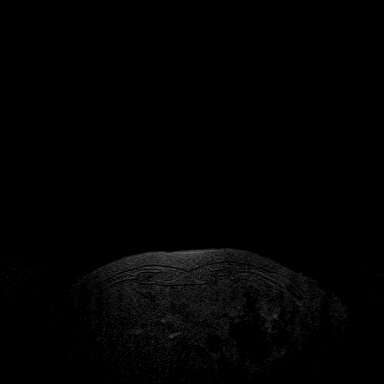
[im 40/160]
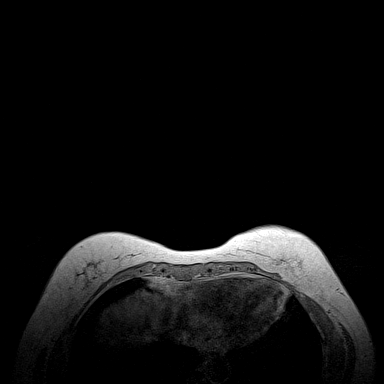
[im 80/160]
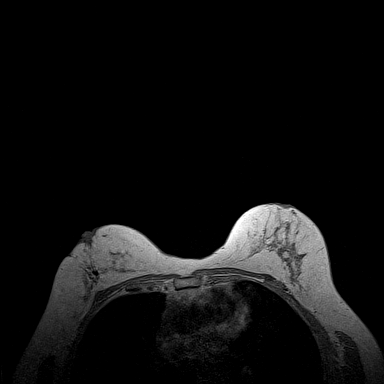
[im 120/160]
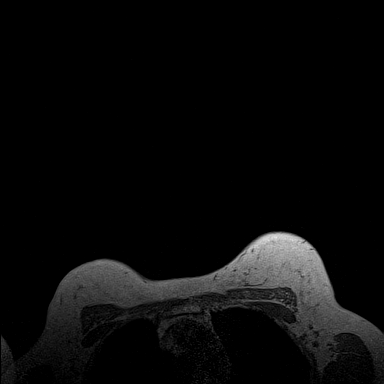
[im 160/160]
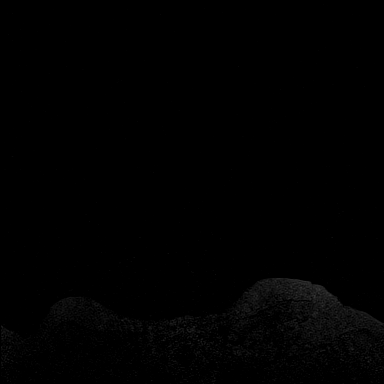

[Series 4: fl3d pre-cm · axial · non-contrast · 1.2mm · 0.89mm/px · z∈[-122,+69]mm · 5 of 160 slices shown]
[im 1/160]
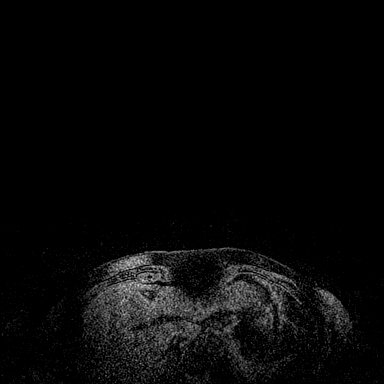
[im 40/160]
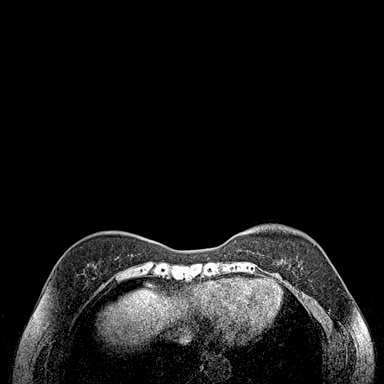
[im 80/160]
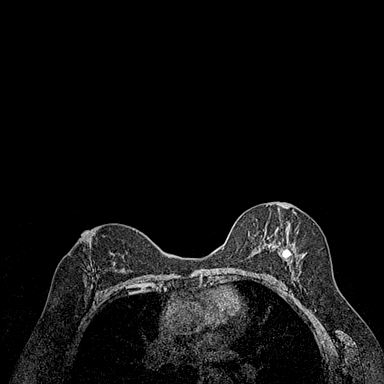
[im 120/160]
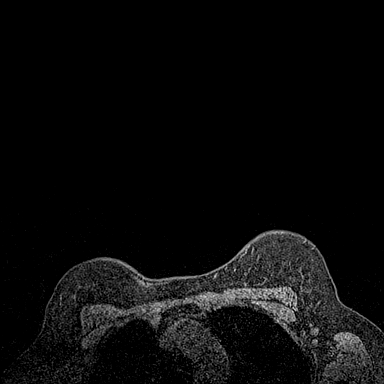
[im 160/160]
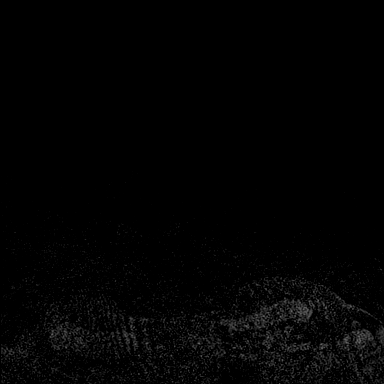

[Series 5: fl3d post-cm 20 · axial · 1.2mm · 0.89mm/px · z∈[-122,+69]mm · 5 of 160 slices shown (1 of 3)]
[im 1/160]
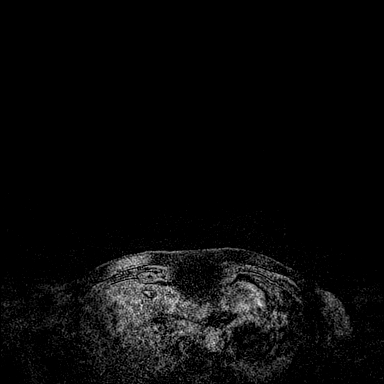
[im 40/160]
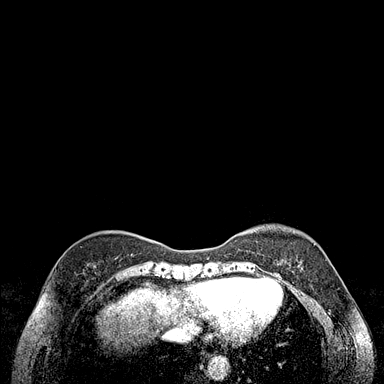
[im 80/160]
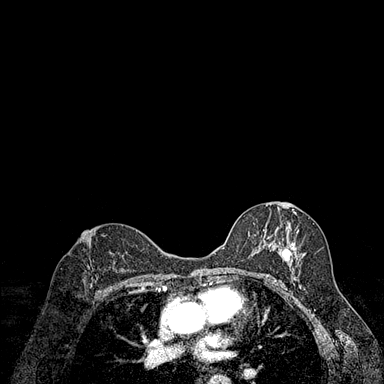
[im 120/160]
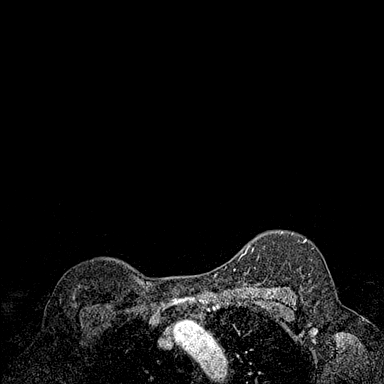
[im 160/160]
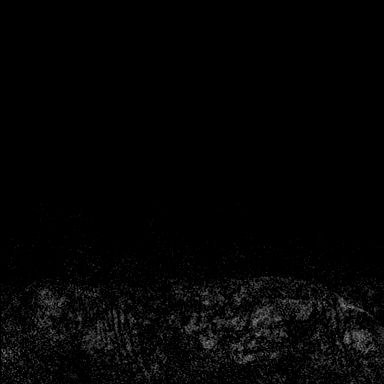

[Series 6: fl3d post-cm 20 · axial · 1.2mm · 0.89mm/px · z∈[-122,+69]mm · 5 of 160 slices shown (2 of 3)]
[im 1/160]
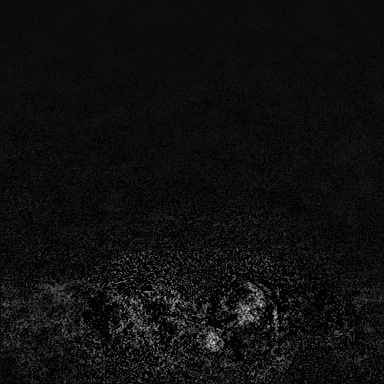
[im 40/160]
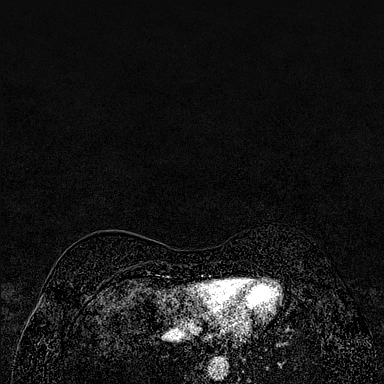
[im 80/160]
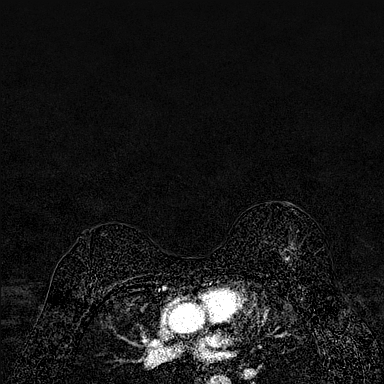
[im 120/160]
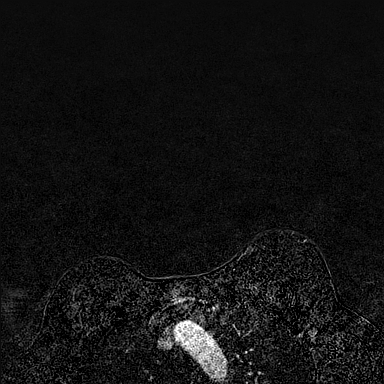
[im 160/160]
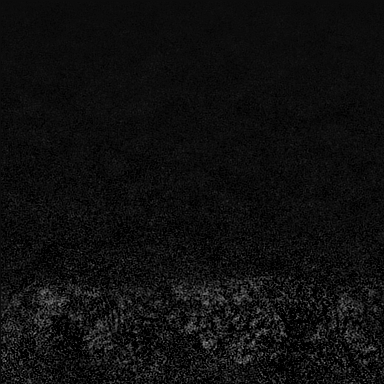

[Series 7: fl3d post-cm 20 · axial · 192.0mm · 0.89mm/px · 1 of 1 slices shown (3 of 3)]
[im 1/1]
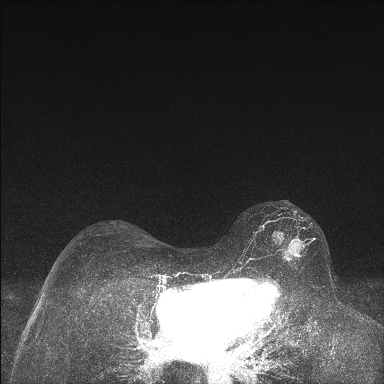

[Series 8: fl3d post-cm 3 · axial · 1.2mm · 0.89mm/px · z∈[-122,+69]mm · 6 of 160 slices shown (1 of 2)]
[im 1/160]
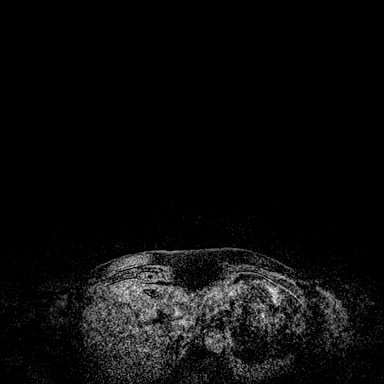
[im 32/160]
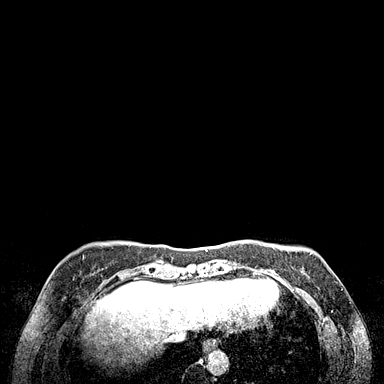
[im 64/160]
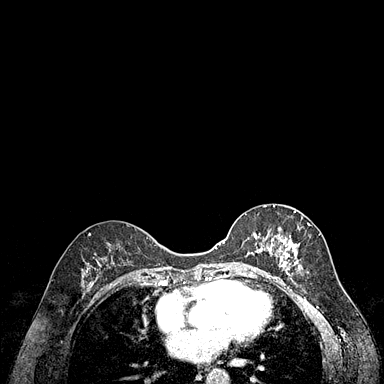
[im 96/160]
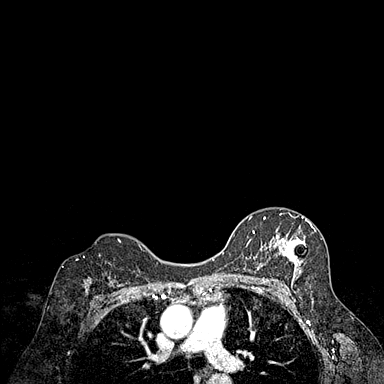
[im 128/160]
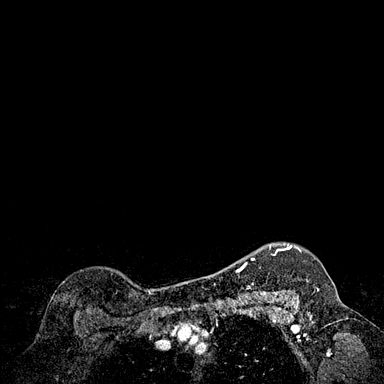
[im 160/160]
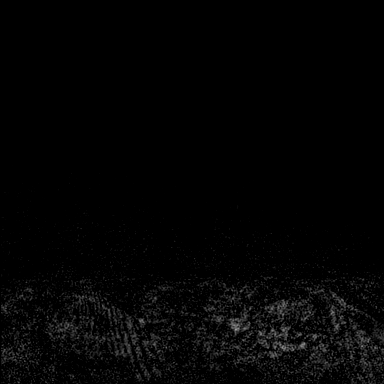

[Series 9: fl3d post-cm 3 · axial · 1.2mm · 0.89mm/px · z∈[-122,-8]mm · 4 of 160 slices shown (2 of 2)]
[im 1/160]
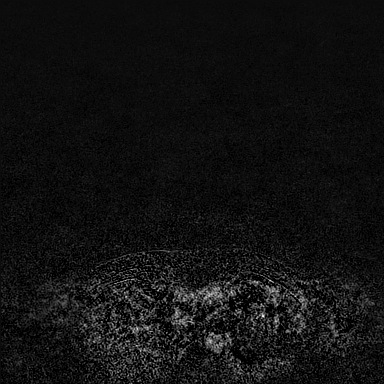
[im 32/160]
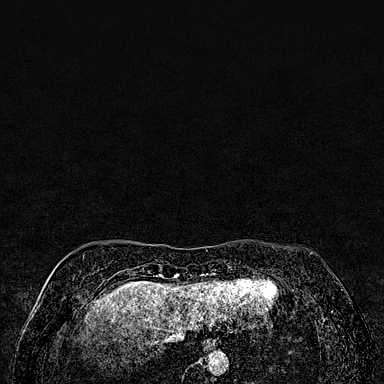
[im 64/160]
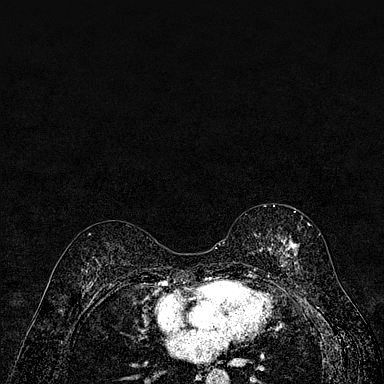
[im 96/160]
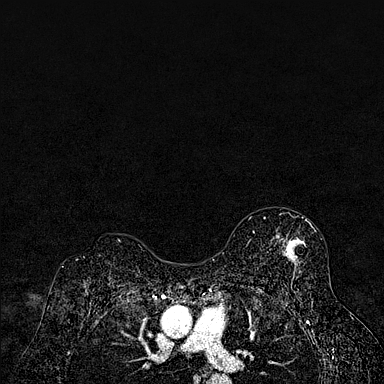

[32 of 48 positions shown; findings below may reference images not displayed]

Three-dimensional MR images were rendered by post-processing of the
original MR data on an independent workstation. The
three-dimensional MR images were interpreted, and findings are
reported in the following complete MRI report for this study. Three
dimensional images were evaluated at the independent interpreting
workstation using the DynaCAD thin client.
FINDINGS: Breast composition: c. Heterogeneous fibroglandular tissue.

Background parenchymal enhancement: Mild.

Right breast: Post lumpectomy changes are noted in the upper right
breast. No suspicious mass or abnormal enhancement identified.

Left breast: Susceptibility artifact from post biopsy marking clip
is demonstrated in the upper outer and lower outer quadrants of the
left breast. These are seen in association with 2 irregular,
enhancing masses. Two additional enhancing masses are also
identified within the upper outer and upper inner quadrant of the
left breast for a total of 4 masses. These correspond with the
masses identified on recent diagnostic ultrasound evaluation as
follows:

1.9 x 1.7 x 2.0 cm mass in the upper outer quadrant at mid depth

1.3 x 1.3 x 1.3 cm mass in the upper inner quadrant at mid depth

1.2 x 1.0 x 0.9 cm mass in the upper outer quadrant at posterior
depth

1.3 x 1.1 x 1.7 cm mass in the lower outer quadrant at middle depth

No additional suspicious enhancement within the remainder of the
left breast.

Lymph nodes: Four mildly prominent level 1 lymph nodes are
identified within the left axilla. The largest of these contains
susceptibility artifact from a post biopsy clip. No suspicious right
axillary or internal mammary chain lymphadenopathy.

Ancillary findings:  None.
IMPRESSION: 1. Four suspicious masses within the left breast consistent with
multifocal, multicentric disease. Two of the four masses have been
previously biopsied.
2. Four mildly prominent level 1 left axillary lymph nodes. The
largest of these contains susceptibility artifact from post biopsy
marking clip.
3. Right breast posttreatment changes without MRI evidence of
malignancy.

RECOMMENDATION:
Per clinical treatment plan.

BI-RADS CATEGORY  6: Known biopsy-proven malignancy.

## 2021-02-09 MED ORDER — GADOBUTROL 1 MMOL/ML IV SOLN
7.0000 mL | Freq: Once | INTRAVENOUS | Status: AC | PRN
  Administered 2021-02-09: 7 mL via INTRAVENOUS

## 2021-02-14 DIAGNOSIS — Z170 Estrogen receptor positive status [ER+]: Secondary | ICD-10-CM | POA: Insufficient documentation

## 2021-02-14 NOTE — Assessment & Plan Note (Signed)
03/20/2018:Right lumpectomy: Scattered microscopic foci of DCIS intermediate grade, margins negative, ER 90%, PR 10%, Tis NX stage 0 Adjuvant radiation 04/29/2018-05/26/2018  Treatment plan: Adjuvant tamoxifen 20 mg once daily x5 years started May 2020 Tamoxifen toxicities: Occasional hot flashes but they are no different than previously.  Right breast discomfort: I suspect this is radiation recall.  No palpable lumps or nodules.  She recently had a mammogram and therefore we decided not to do any further investigations at this time.  We discussed the role of essential oils and CBD oil for pain relief as well as turmeric and Tylenol or Motrin for anti-inflammatory effect.  Breast cancer surveillance:   She is expecting her first grandchild this year.  She is very excited about it.  Return to clinic in 1 year for follow-up

## 2021-02-14 NOTE — Progress Notes (Signed)
° °Patient Care Team: °Becker, Anna G, PA as PCP - General (Family Medicine) °Ingram, Haywood, MD as Consulting Physician (General Surgery) °Gudena, Vinay, MD as Consulting Physician (Hematology and Oncology) °Kinard, James, MD as Consulting Physician (Radiation Oncology) ° °DIAGNOSIS:  °  ICD-10-CM   °1. Ductal carcinoma in situ (DCIS) of right breast  D05.11   °  °2. Malignant neoplasm of upper-outer quadrant of left breast in female, estrogen receptor positive (HCC)  C50.412   ° Z17.0   °  ° ° °SUMMARY OF ONCOLOGIC HISTORY: °Oncology History  °Ductal carcinoma in situ (DCIS) of right breast  °01/30/2018 Mammogram  ° Diagnostic Mammogram  °0.4cm cluster of calcifications, 12 o'clock position, 4cm fn, right breast °  °02/12/2018 Initial Diagnosis  ° Screening detected right breast calcifications 4 mm size UOQ 11:30 position stereotactic biopsy revealed high-grade DCIS ER 90%, PR 10%, Tis NX stage 0 °  °02/18/2018 Genetic Testing  ° Negative.  Genes tested include: ATM, BRCA1, BRCA2, CDH1, CHEK2, PALB2, PTEN, STK11 and TP53; APC, ATM, AXIN2, BARD1, BMPR1A, BRCA1, BRCA2, BRIP1, CDH1, CDKN2A (p14ARF), CDKN2A (p16INK4a), CKD4, CHEK2, CTNNA1, DICER1, EPCAM (Deletion/duplication testing only), GREM1 (promoter region deletion/duplication testing only), KIT, MEN1, MLH1, MSH2, MSH3, MSH6, MUTYH, NBN, NF1, NHTL1, PALB2, PDGFRA, PMS2, POLD1, POLE, PTEN, RAD50, RAD51C, RAD51D, SDHB, SDHC, SDHD, SMAD4, SMARCA4. STK11, TP53, TSC1, TSC2, and VHL.  The following genes were evaluated for sequence changes only: SDHA and HOXB13 c.251G>A variant only. °  °03/20/2018 Surgery  ° Right lumpectomy: Scattered microscopic foci of DCIS intermediate grade, margins negative, ER 90%, PR 10%, Tis NX stage 0 °  °04/01/2018 Cancer Staging  ° Staging form: Breast, AJCC 8th Edition °- Pathologic: Stage 0 (pTis (DCIS), pN0, cM0, ER+, PR+) - Signed by Causey, Lindsey Cornetto, NP on 04/01/2018 ° °  °04/28/2018 - 05/26/2018 Radiation Therapy  ° Adjuvant  radiation 1. Right breast; 15 fractions of 2.67 Gy for a total of 40.05 Gy 2. Boost; 5 fractions of 2 Gy for a total of 10 Gy °  °  °05/2018 -  Anti-estrogen oral therapy  ° Tamoxifen daily °  °Malignant neoplasm of upper-outer quadrant of left breast in female, estrogen receptor positive (HCC)  °01/19/2021 Initial Diagnosis  ° Left breast biopsy: 2:00 and 4:00: Grade 3 IDC ER 40% weak, PR 0%, Ki-67 40%, HER2 2+ equivocal by IHC, FISH negative °Lymph node biopsy: Benign °  °02/12/2021 Breast MRI  ° Breast MRI 02/12/2021: 4 suspicious masses within the left breast consistent with multifocal, multicentric disease 2 of the 4 masses have been biopsied (2 cm, 1.3 cm, 1.2 cm, 1.7 cm) for mildly prominent level 1 lymph nodes are identified °  ° ° °CHIEF COMPLIANT: Follow-up of right breast DCIS ° °INTERVAL HISTORY: Jasmine Maddox is a 61 y.o. with above-mentioned history of right breast DCIS who underwent a lumpectomy, radiation, and who is currently on antiestrogen therapy with tamoxifen. Biopsy on 01/19/2021 showed invasive ductal carcinoma, ER+(40%)/PR-/Her2-. Mammogram on 02/09/2021 showed four suspicious masses within the left breast consistent with multifocal, multicentric disease and four mildly prominent level 1 left axillary lymph nodes. She presents to the clinic today for annual follow-up.  ° °ALLERGIES:  has No Known Allergies. ° °MEDICATIONS:  °Current Outpatient Medications  °Medication Sig Dispense Refill  ° lisinopril (PRINIVIL,ZESTRIL) 20 MG tablet Take 20 mg by mouth daily.    ° tamoxifen (NOLVADEX) 20 MG tablet Take 1 tablet by mouth once daily 90 tablet 0  ° °No current facility-administered medications for this visit.  ° ° °  PHYSICAL EXAMINATION: °ECOG PERFORMANCE STATUS: 1 - Symptomatic but completely ambulatory ° °Vitals:  ° 02/15/21 1029  °BP: (!) 152/76  °Pulse: 66  °Resp: 17  °Temp: (!) 97.3 °F (36.3 °C)  °SpO2: 100%  ° °Filed Weights  ° 02/15/21 1029  °Weight: 147 lb 8 oz (66.9 kg)  ° °  ° °LABORATORY  DATA:  °I have reviewed the data as listed °CMP Latest Ref Rng & Units 02/18/2018  °Glucose 70 - 99 mg/dL 94  °BUN 6 - 20 mg/dL 10  °Creatinine 0.44 - 1.00 mg/dL 0.77  °Sodium 135 - 145 mmol/L 140  °Potassium 3.5 - 5.1 mmol/L 4.0  °Chloride 98 - 111 mmol/L 106  °CO2 22 - 32 mmol/L 27  °Calcium 8.9 - 10.3 mg/dL 9.5  °Total Protein 6.5 - 8.1 g/dL 7.7  °Total Bilirubin 0.3 - 1.2 mg/dL 0.4  °Alkaline Phos 38 - 126 U/L 64  °AST 15 - 41 U/L 28  °ALT 0 - 44 U/L 32  ° ° °Lab Results  °Component Value Date  ° WBC 4.7 02/18/2018  ° HGB 12.5 02/18/2018  ° HCT 37.2 02/18/2018  ° MCV 88.6 02/18/2018  ° PLT 183 02/18/2018  ° NEUTROABS 2.0 02/18/2018  ° ° °ASSESSMENT & PLAN:  °Ductal carcinoma in situ (DCIS) of right breast °03/20/2018:Right lumpectomy: Scattered microscopic foci of DCIS intermediate grade, margins negative, ER 90%, PR 10%, Tis NX stage 0 °Adjuvant radiation 04/29/2018-05/26/2018 °  °Treatment plan: Adjuvant tamoxifen 20 mg once daily x5 years started May 2020 °Tamoxifen toxicities: Occasional hot flashes but they are no different than previously. °  °  ° °Malignant neoplasm of upper-outer quadrant of left breast in female, estrogen receptor positive (HCC) °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, PR 0%, Ki-67 40%, HER2 2+ equivocal by IHC, FISH negative Lymph node biopsy: Benign ° °Breast MRI 02/12/2021: 4 suspicious masses within the left breast consistent with multifocal, multicentric disease 2 of the 4 masses have been biopsied (2 cm, 1.3 cm, 1.2 cm, 1.7 cm) for mildly prominent level 1 lymph nodes are identified ° °Pathology and radiology counseling: Discussed with the patient, the details of pathology including the type of breast cancer,the clinical staging, the significance of ER, PR and HER-2/neu receptors and the implications for treatment. After reviewing the pathology in detail, we proceeded to discuss the different treatment options between surgery, radiation, chemotherapy,  antiestrogen therapies.  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 ° °Treatment plan: °1.  Neoadjuvant chemotherapy with dose dense Adriamycin Cytoxan followed by Taxol   °2. mastectomy with sentinel lymph node biopsy °3.  Adjuvant radiation °4.  Followed by adjuvant antiestrogen therapy ° °Chemotherapy Counseling: I discussed the risks and benefits of chemotherapy including the risks of nausea/ vomiting, risk of infection from low WBC count, fatigue due to chemo or anemia, bruising or bleeding due to low platelets, mouth sores, loss/ change in taste and decreased appetite. Liver and kidney function will be monitored through out chemotherapy as abnormalities in liver and kidney function may be a side effect of treatment. Cardiac dysfunction due to Adriamycin and neuropathy risk of Taxol were discussed in detail. Risk of permanent bone marrow dysfunction and leukemia due to chemo were also discussed. ° °Port placement, chemo class, echocardiogram will be obtained °Return to clinic to start chemotherapy ° ° ° °No orders of the defined types were placed in this encounter. ° °The patient has a good understanding of the overall plan. she agrees with   it. she will call with any problems that may develop before the next visit here. ° °Total time spent: 60 mins including face to face time and time spent for planning, charting and coordination of care ° °Vinay K Gudena, MD, MPH °02/15/2021 ° °I, Kirstyn Evans, am acting as scribe for Dr. Vinay Gudena. ° °I have reviewed the above documentation for accuracy and completeness, and I agree with the above. ° ° ° ° ° ° °

## 2021-02-14 NOTE — Assessment & Plan Note (Addendum)
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, PR 0%, Ki-67 40%, HER2 2+ equivocal by IHC, FISH negative Lymph node biopsy: Benign  Breast MRI 02/12/2021: 4 suspicious masses within the left breast consistent with multifocal, multicentric disease 2 of the 4 masses have been biopsied (2 cm, 1.3 cm, 1.2 cm, 1.7 cm) for mildly prominent level 1 lymph nodes are identified  Pathology and radiology counseling: Discussed with the patient, the details of pathology including the type of breast cancer,the clinical staging, the significance of ER, PR and HER-2/neu receptors and the implications for treatment. After reviewing the pathology in detail, we proceeded to discuss the different treatment options between surgery, radiation, chemotherapy, antiestrogen therapies.  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  Treatment plan: 1.  Neoadjuvant chemotherapy with dose dense Adriamycin Cytoxan Keytruda followed by Taxol carboplatin and Keytruda followed by Beryle Flock maintenance 2. breast conserving surgery with sentinel lymph node biopsy 3.  Adjuvant radiation 4.  Followed by adjuvant antiestrogen therapy  Chemotherapy Counseling: I discussed the risks and benefits of chemotherapy including the risks of nausea/ vomiting, risk of infection from low WBC count, fatigue due to chemo or anemia, bruising or bleeding due to low platelets, mouth sores, loss/ change in taste and decreased appetite. Liver and kidney function will be monitored through out chemotherapy as abnormalities in liver and kidney function may be a side effect of treatment. Cardiac dysfunction due to Adriamycin Herceptin and Perjeta were discussed in detail. Risk of permanent bone marrow dysfunction and leukemia due to chemo were also discussed.  Port placement, chemo class, echocardiogram will be obtained Return to clinic to start chemotherapy

## 2021-02-15 ENCOUNTER — Encounter: Payer: Self-pay | Admitting: *Deleted

## 2021-02-15 ENCOUNTER — Other Ambulatory Visit: Payer: Self-pay

## 2021-02-15 ENCOUNTER — Inpatient Hospital Stay: Payer: BC Managed Care – PPO | Attending: Hematology and Oncology | Admitting: Hematology and Oncology

## 2021-02-15 DIAGNOSIS — C50412 Malignant neoplasm of upper-outer quadrant of left female breast: Secondary | ICD-10-CM | POA: Diagnosis not present

## 2021-02-15 DIAGNOSIS — Z170 Estrogen receptor positive status [ER+]: Secondary | ICD-10-CM

## 2021-02-15 DIAGNOSIS — D0511 Intraductal carcinoma in situ of right breast: Secondary | ICD-10-CM | POA: Insufficient documentation

## 2021-02-15 DIAGNOSIS — C50512 Malignant neoplasm of lower-outer quadrant of left female breast: Secondary | ICD-10-CM | POA: Diagnosis not present

## 2021-02-15 DIAGNOSIS — Z923 Personal history of irradiation: Secondary | ICD-10-CM | POA: Insufficient documentation

## 2021-02-16 ENCOUNTER — Telehealth: Payer: Self-pay | Admitting: Hematology and Oncology

## 2021-02-16 ENCOUNTER — Telehealth: Payer: Self-pay | Admitting: *Deleted

## 2021-02-16 ENCOUNTER — Other Ambulatory Visit: Payer: Self-pay | Admitting: General Surgery

## 2021-02-16 NOTE — Telephone Encounter (Signed)
Called to follow up from appt with Dr. Lindi Adie and give navigation resources.  Left message for a return phone call.

## 2021-02-16 NOTE — Telephone Encounter (Signed)
Sch per 1/26 inbasket, left msg for pt

## 2021-02-19 ENCOUNTER — Ambulatory Visit: Payer: BC Managed Care – PPO | Attending: General Surgery

## 2021-02-19 ENCOUNTER — Other Ambulatory Visit: Payer: Self-pay

## 2021-02-19 ENCOUNTER — Other Ambulatory Visit: Payer: Self-pay | Admitting: Hematology and Oncology

## 2021-02-19 DIAGNOSIS — R293 Abnormal posture: Secondary | ICD-10-CM | POA: Insufficient documentation

## 2021-02-19 DIAGNOSIS — Z170 Estrogen receptor positive status [ER+]: Secondary | ICD-10-CM

## 2021-02-19 DIAGNOSIS — C50412 Malignant neoplasm of upper-outer quadrant of left female breast: Secondary | ICD-10-CM

## 2021-02-19 MED ORDER — PROCHLORPERAZINE MALEATE 10 MG PO TABS: 10.0000 mg | ORAL_TABLET | Freq: Four times a day (QID) | ORAL | 1 refills | Status: DC | PRN

## 2021-02-19 MED ORDER — LIDOCAINE-PRILOCAINE 2.5-2.5 % EX CREA: TOPICAL_CREAM | CUTANEOUS | 3 refills | Status: DC

## 2021-02-19 MED ORDER — DEXAMETHASONE 4 MG PO TABS: 4.0000 mg | ORAL_TABLET | Freq: Every day | ORAL | 0 refills | Status: DC

## 2021-02-19 MED ORDER — ONDANSETRON HCL 8 MG PO TABS: 8.0000 mg | ORAL_TABLET | Freq: Two times a day (BID) | ORAL | 1 refills | Status: DC | PRN

## 2021-02-19 NOTE — Therapy (Signed)
[facility] @ [facility] [address], [place], [address] Phone: [phone]   Fax:  [phone]  Physical Therapy Evaluation  Patient Details  Name: Jasmine Maddox MRN: [id number] Date of Birth: 09/15/1960 Referring Provider (PT): Dr. Donne Hazel   Encounter Date: 02/19/2021   PT End of Session - 02/19/21 1601     Visit Number 1    Number of Visits 2    Date for PT Re-Evaluation 07/09/21    PT Start Time 1502    PT Stop Time 1553    PT Time Calculation (min) 51 min    Activity Tolerance Patient tolerated treatment well    Behavior During Therapy [facility], [facility], [facility] for tasks assessed/performed             Past Medical History:  Diagnosis Date   Cervical cancer ([place])    Family history of breast cancer    Hypertension     Past Surgical History:  Procedure Laterality Date   BREAST LUMPECTOMY WITH RADIOACTIVE SEED LOCALIZATION Right 03/20/2018   Procedure: RIGHT BREAST LUMPECTOMY WITH RADIOACTIVE SEED LOCALIZATION;  Surgeon: [name], MD;  Location: [facility];  Service: General;  Laterality: Right;   CHOLECYSTECTOMY      There were no vitals filed for this visit.    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  A recent screening mammogram showed multiple areas in the left breast and these were biopsied indicating gr. 3 IDC.  She will have neoadjuvant chemo, surgery and then radiation on the left.    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 She does have a significant family history. She had genetic testing in 2020 that was negative. She had a screening mammogram that showed 4 areas on the left. They were biopsied and determined to be Gr. 3 IDC ER positive at 40%, PR negative, HER2 negative, and  Ki-67 is 40%. she will start neoadjuvant chemo in February and will then have surgery, followed by radiation.    Patient Stated Goals Here for pre-surgery baselines and to gain information from provider    Currently in Pain? No/denies    Multiple Pain Sites No                [facility] PT Assessment - 02/19/21 0001       Assessment   Medical Diagnosis Left Breast Cancer    Referring Provider (PT) Dr. Donne Hazel    Onset Date/Surgical Date 01/18/21    Hand Dominance Right    Prior Therapy no      Precautions   Precaution Comments Active CA      Restrictions   Weight Bearing Restrictions No      Balance Screen   Has the patient fallen in the past 6 months No    Has the patient had a decrease in activity level because of a fear of falling?  No    Is the patient reluctant to leave their home because of a fear of falling?  No      Home Environment   Living Environment Private residence    Living Arrangements Spouse/significant other    Available Help at Discharge Family      Prior Function   Level of Independence Independent  Vocation Full time employment    Armed forces logistics/support/administrative officer at [employer] walking, exercise -aerobic, dancing      Cognition   Overall Cognitive Status Within Functional Limits for tasks assessed      Posture/Postural Control   Postural Limitations Rounded Shoulders;Forward head      AROM   Right Shoulder Extension 55 Degrees    Right Shoulder Flexion 159 Degrees    Right Shoulder ABduction 162 Degrees    Right Shoulder Internal Rotation 63 Degrees    Right Shoulder External Rotation 105 Degrees    Left Shoulder Extension 57 Degrees    Left Shoulder Flexion 159 Degrees    Left Shoulder ABduction 165 Degrees    Left Shoulder Internal Rotation 65 Degrees    Left Shoulder External Rotation 95 Degrees               LYMPHEDEMA/ONCOLOGY QUESTIONNAIRE - 02/19/21 0001       Type   Cancer Type Left breast Cancer       Surgeries   Lumpectomy Date 03/20/18   right   Number Lymph Nodes Removed --   0 in 2020     Treatment   Active Chemotherapy Treatment --   pending;to start in Feb.   Past Chemotherapy Treatment No    Active Radiation Treatment No    Past Radiation Treatment Yes    Current Hormone Treatment Yes    Past Hormone Therapy Yes      What other symptoms do you have   Are you Having Heaviness or Tightness No    Are you having Pain No    Are you having pitting edema No    Is it Hard or Difficult finding clothes that fit No    Do you have infections No    Is there Decreased scar mobility No      Right Upper Extremity Lymphedema   10 cm Proximal to Olecranon Process 28.5 cm    Olecranon Process 25 cm    10 cm Proximal to Ulnar Styloid Process 22.7 cm    Just Proximal to Ulnar Styloid Process 15.8 cm    At Base of 2nd Digit 6.4 cm      Left Upper Extremity Lymphedema   10 cm Proximal to Olecranon Process 27.9 cm    Olecranon Process 24.6 cm    10 cm Proximal to Ulnar Styloid Process 21.7 cm    Just Proximal to Ulnar Styloid Process 15.5 cm    At Base of 2nd Digit 6.5 cm             L-DEX FLOWSHEETS - 02/19/21 1500       L-DEX LYMPHEDEMA SCREENING   Measurement Type Unilateral    L-DEX MEASUREMENT EXTREMITY Upper Extremity    POSITION  Standing    DOMINANT SIDE Right    At Risk Side Left    BASELINE SCORE (UNILATERAL) 1.5            The patient was assessed using the L-Dex machine today to produce a lymphedema index baseline score. The patient will be reassessed on a regular basis (typically every 3 months) to obtain new L-Dex scores. If the score is > 6.5 points away from his/her baseline score indicating onset of subclinical lymphedema, it will be recommended to wear a compression garment for 4 weeks, 12 hours per day and then be reassessed. If the score continues to be > 6.5 points from baseline at reassessment, we  will initiate lymphedema treatment. Assessing in  this manner has a 95% rate of preventing clinically significant lymphedema.       [name] - 02/19/21 0001     Open a tight or new jar No difficulty    Do heavy household chores (wash walls, wash floors) No difficulty    Carry a shopping bag or briefcase No difficulty    Wash your back Mild difficulty    Use a knife to cut food No difficulty    Recreational activities in which you take some force or impact through your arm, shoulder, or hand (golf, hammering, tennis) No difficulty    During the past week, to what extent has your arm, shoulder or hand problem interfered with your normal social activities with family, friends, neighbors, or groups? Not at all    During the past week, to what extent has your arm, shoulder or hand problem limited your work or other regular daily activities Not at all    Arm, shoulder, or hand pain. None    Tingling (pins and needles) in your arm, shoulder, or hand None    Difficulty Sleeping No difficulty    DASH Score 2.27 %              Objective measurements completed on examination: See above findings.                PT Education - 02/19/21 2029     Education Details 4 post-op exercises, ABC class, lymphedema, every 3 month SOZO screens for 2 years    Person(s) Educated Patient    Methods Explanation;Handout    Comprehension Verbalized understanding;Returned demonstration                 PT Long Term Goals - 02/19/21 2040       PT LONG TERM GOAL #1   Title pt will restore post op ROM and function to within 5 degrees of pre-surgical levels    Time 20    Period Weeks    Status New    Target Date 07/09/21             Breast Clinic Goals - 02/19/21 2039       Patient will be able to verbalize understanding of pertinent lymphedema risk reduction practices relevant to her diagnosis specifically related to skin care.   Time 1    Period Days    Status Achieved    Target Date 02/19/21      Patient will be  able to return demonstrate and/or verbalize understanding of the post-op home exercise program related to regaining shoulder range of motion.   Time 1    Period Days    Status Achieved    Target Date 02/19/21      Patient will be able to verbalize understanding of the importance of attending the postoperative After Breast Cancer Class for further lymphedema risk reduction education and therapeutic exercise.   Time 1    Period Days    Status Achieved    Target Date 02/19/21                   Plan - 02/19/21 1638     Clinical Impression Statement Pt was seen for pre-surgical baselines.  She had a prior right Lumpectomy in 2020 for lumpectomy without SLNB, but did have radiation.  She will be starting neoadjuvant chemo in February with a surgery date to be determined for left Mastectomy with SLNB.  She will  likely have radiation after surgery. Baselines were taken for bilateral shoulder ROM, and arm circumference.  She also had a baseline SOZO screen done. She was instructed in 4 post op exercises to perform as allowed by MD but after drains are removed. She was educated in our ABC class, and also educated briefly about lymphedema. She was educated in [place] screens every 3 months for the first 2 years. She was not able to be set up for her post op reassessment, ABC class or SOZO as surgical date is unknown at this time.    Personal Factors and Comorbidities Comorbidity 2    Comorbidities 2nd occurence of CA;presently on Left and pending mastectomy with SLNB after neoadjuvant chemo, then radiation.    Stability/Clinical Decision Making Stable/Uncomplicated    Clinical Decision Making Low    Rehab Potential Excellent    PT Frequency --   1x visit post surgery   PT Duration --   20  weeks secondary to pt having neoadjuvant chemo   PT Treatment/Interventions ADLs/Self Care Home Management;Therapeutic exercise;Manual techniques;Patient/family education;Passive range of motion;Scar  mobilization    PT Next Visit Plan reassess after surgery, schedule ABC, SOZO, Determine further PT needs    PT Home Exercise Plan 4 post op exercises after drains removed and with MD approval    Consulted and Agree with Plan of Care Patient           Patient will follow up at outpatient cancer rehab 3-4 weeks following surgery.  If the patient requires physical therapy at that time, a specific plan will be dictated and sent to the referring physician for approval. The patient was educated today on appropriate basic range of motion exercises to begin post operatively and the importance of attending the After Breast Cancer class following surgery.  Patient was educated today on lymphedema risk reduction practices as it pertains to recommendations that will benefit the patient immediately following surgery.  She verbalized good understanding.     Patient will benefit from skilled therapeutic intervention in order to improve the following deficits and impairments:  Decreased knowledge of precautions, Postural dysfunction, Decreased range of motion  Visit Diagnosis: Malignant neoplasm of upper-outer quadrant of left female breast, unspecified estrogen receptor status ([place])  Abnormal posture     Problem List Patient Active Problem List   Diagnosis Date Noted   Malignant neoplasm of upper-outer quadrant of left breast in female, estrogen receptor positive ([place]) 02/14/2021   Genetic testing 02/25/2018   Family history of breast cancer    Ductal carcinoma in situ (DCIS) of right breast 02/12/2018    Claris Pong, PT 02/19/2021, 8:41 PM  [facility] @ [address] [place], [place], [address] Phone: [phone]   Fax:  [phone]  Name: Jasmine Maddox MRN: [id number] Date of Birth: 08-Nov-1960

## 2021-02-19 NOTE — Progress Notes (Signed)
START ON PATHWAY REGIMEN - Breast     Cycles 1 through 4: A cycle is every 14 days:     Doxorubicin      Cyclophosphamide      Pegfilgrastim-xxxx    Cycles 5 through 16: A cycle is every 7 days:     Paclitaxel   **Always confirm dose/schedule in your pharmacy ordering system**  Patient Characteristics: Preoperative or Nonsurgical Candidate (Clinical Staging), Neoadjuvant Therapy followed by Surgery, Invasive Disease, Chemotherapy, HER2 Negative/Unknown/Equivocal, ER Positive Therapeutic Status: Preoperative or Nonsurgical Candidate (Clinical Staging) AJCC M Category: cM0 AJCC Grade: G3 Breast Surgical Plan: Neoadjuvant Therapy followed by Surgery ER Status: Positive (+) AJCC 8 Stage Grouping: IB HER2 Status: Negative (-) AJCC T Category: cT1c AJCC N Category: cN0 PR Status: Negative (-) Intent of Therapy: Curative Intent, Discussed with Patient

## 2021-02-19 NOTE — Patient Instructions (Signed)
Physical Therapy Information for After Breast Cancer Surgery/Treatment:  Lymphedema is a swelling condition that you may be at risk for in your arm if you have lymph nodes removed from the armpit area.  After a sentinel node biopsy, the risk is approximately 5-9% and is higher after an axillary node dissection.  There is treatment available for this condition and it is not life-threatening.  Contact your physician or physical therapist with concerns. You may begin the 4 shoulder/posture exercises (see additional sheet) when permitted by your physician (typically a week after surgery).  If you have drains, you may need to wait until those are removed before beginning range of motion exercises.  A general recommendation is to not lift your arms above shoulder height until drains are removed.  These exercises should be done to your tolerance and gently.  This is not a "no pain/no gain" type of recovery so listen to your body and stretch into the range of motion that you can tolerate, stopping if you have pain.  If you are having immediate reconstruction, ask your plastic surgeon about doing exercises as he or she may want you to wait. We encourage you to attend the free one time ABC (After Breast Cancer) class offered by [facility].  You will learn information related to lymphedema risk, prevention and treatment and additional exercises to regain mobility following surgery.  You can call [phone] for more information.  This is offered the 1st and 3rd Monday of each month.  You only attend the class one time. While undergoing any medical procedure or treatment, try to avoid blood pressure being taken or needle sticks from occurring on the arm on the side of cancer.   This recommendation begins after surgery and continues for the rest of your life.  This may help reduce your risk of getting lymphedema (swelling in your arm). An excellent resource for those seeking information on  lymphedema is the National Lymphedema Network's web site. It can be accessed at [place].org If you notice swelling in your hand, arm or breast at any time following surgery (even if it is many years from now), please contact your doctor or physical therapist to discuss this.  Lymphedema can be treated at any time but it is easier for you if it is treated early on.  If you feel like your shoulder motion is not returning to normal in a reasonable amount of time, please contact your surgeon or physical therapist.  [facility]. [place], [place], [place] [phone]; [address], [place], [place] [address] ABC CLASS After Breast Cancer Class  After Breast Cancer Class is a specially designed exercise class to assist you in a safe recover after having breast cancer surgery.  In this class you will learn how to get back to full function whether your drains were just removed or if you had surgery a month ago.  This one-time class is held the 1st and 3rd Monday of every month from 11:00 a.m. until 12:00 noon and is a virtual class on Web[employer]  This class is FREE and space is limited. For more information or to register for the next available class, call [phone].  Class Goals  Understand specific stretches to improve the flexibility of you chest and shoulder. Learn ways to safely strengthen your upper body and improve your posture. Understand the warning signs of infection and why you may be at risk for an arm infection. Learn about Lymphedema and prevention.  ** You  do not attend this class until after surgery.  Drains must be removed to participate  Patient was instructed today in a home exercise program today for post op shoulder range of motion. These included active assist shoulder flexion in sitting, scapular retraction, wall walking with shoulder abduction, and hands behind head external rotation.  She was encouraged to do these twice a day, holding 3 seconds and repeating 5 times when permitted by  her physician.

## 2021-02-20 ENCOUNTER — Encounter: Payer: Self-pay | Admitting: *Deleted

## 2021-02-20 ENCOUNTER — Telehealth: Payer: Self-pay | Admitting: *Deleted

## 2021-02-20 NOTE — Telephone Encounter (Signed)
Spoke with patient regarding schedule and the start of chemo. Informed Dr. Donne Hazel can only place the port on 2/21. Her concern was that if her flight was delayed on 2/20, but he had no other time that week.  So she will do port on 2/21 and start chemo on 2/22. Scheduled echo for 2/13 at 10am and then chemo education same day at 11am and she is aware of these appointments.  Encouraged her to call should she have any questions or concerns.

## 2021-02-21 ENCOUNTER — Telehealth: Payer: Self-pay | Admitting: Hematology and Oncology

## 2021-02-21 NOTE — Telephone Encounter (Signed)
Sch per 1/31 secure chat, left msg

## 2021-02-27 ENCOUNTER — Other Ambulatory Visit: Payer: BC Managed Care – PPO

## 2021-03-01 ENCOUNTER — Encounter (HOSPITAL_BASED_OUTPATIENT_CLINIC_OR_DEPARTMENT_OTHER): Payer: Self-pay | Admitting: General Surgery

## 2021-03-05 ENCOUNTER — Other Ambulatory Visit: Payer: BC Managed Care – PPO

## 2021-03-05 ENCOUNTER — Ambulatory Visit (HOSPITAL_COMMUNITY)
Admission: RE | Admit: 2021-03-05 | Discharge: 2021-03-05 | Disposition: A | Discharge status: Home / Self Care | Payer: BC Managed Care – PPO | Source: Ambulatory Visit | Attending: Hematology and Oncology | Admitting: Hematology and Oncology

## 2021-03-05 ENCOUNTER — Inpatient Hospital Stay: Payer: BC Managed Care – PPO | Attending: Hematology and Oncology

## 2021-03-05 ENCOUNTER — Other Ambulatory Visit: Payer: Self-pay

## 2021-03-05 DIAGNOSIS — Z0189 Encounter for other specified special examinations: Secondary | ICD-10-CM

## 2021-03-05 DIAGNOSIS — Z5111 Encounter for antineoplastic chemotherapy: Secondary | ICD-10-CM | POA: Insufficient documentation

## 2021-03-05 DIAGNOSIS — Z01818 Encounter for other preprocedural examination: Secondary | ICD-10-CM | POA: Insufficient documentation

## 2021-03-05 DIAGNOSIS — C50412 Malignant neoplasm of upper-outer quadrant of left female breast: Secondary | ICD-10-CM | POA: Insufficient documentation

## 2021-03-05 DIAGNOSIS — Z170 Estrogen receptor positive status [ER+]: Secondary | ICD-10-CM | POA: Diagnosis not present

## 2021-03-05 DIAGNOSIS — Z5189 Encounter for other specified aftercare: Secondary | ICD-10-CM | POA: Insufficient documentation

## 2021-03-05 LAB — ECHOCARDIOGRAM COMPLETE
Area-P 1/2: 4.31 cm2
Calc EF: 63.8 %
S' Lateral: 2.4 cm
Single Plane A2C EF: 63 %
Single Plane A4C EF: 64.2 %

## 2021-03-05 NOTE — Progress Notes (Signed)
°  Echocardiogram 2D Echocardiogram has been performed.  Jasmine Maddox 03/05/2021, 11:23 AM

## 2021-03-08 ENCOUNTER — Encounter (HOSPITAL_BASED_OUTPATIENT_CLINIC_OR_DEPARTMENT_OTHER)
Admission: RE | Admit: 2021-03-08 | Discharge: 2021-03-08 | Disposition: A | Discharge status: Home / Self Care | Payer: BC Managed Care – PPO | Source: Ambulatory Visit | Attending: General Surgery | Admitting: General Surgery

## 2021-03-08 DIAGNOSIS — Z01810 Encounter for preprocedural cardiovascular examination: Secondary | ICD-10-CM | POA: Diagnosis not present

## 2021-03-08 MED ORDER — ENSURE PRE-SURGERY PO LIQD: 296.0000 mL | Freq: Once | ORAL | Status: DC

## 2021-03-08 NOTE — Progress Notes (Signed)

## 2021-03-13 ENCOUNTER — Other Ambulatory Visit: Payer: BC Managed Care – PPO

## 2021-03-13 ENCOUNTER — Encounter (HOSPITAL_BASED_OUTPATIENT_CLINIC_OR_DEPARTMENT_OTHER)
Admission: RE | Disposition: A | Discharge status: Home / Self Care | Payer: Self-pay | Source: Home / Self Care | Attending: General Surgery

## 2021-03-13 ENCOUNTER — Ambulatory Visit: Payer: BC Managed Care – PPO | Admitting: Hematology and Oncology

## 2021-03-13 ENCOUNTER — Ambulatory Visit (HOSPITAL_BASED_OUTPATIENT_CLINIC_OR_DEPARTMENT_OTHER): Payer: BC Managed Care – PPO | Admitting: Certified Registered"

## 2021-03-13 ENCOUNTER — Encounter: Payer: Self-pay | Admitting: Hematology and Oncology

## 2021-03-13 ENCOUNTER — Other Ambulatory Visit: Payer: Self-pay

## 2021-03-13 ENCOUNTER — Ambulatory Visit: Payer: BC Managed Care – PPO

## 2021-03-13 ENCOUNTER — Encounter (HOSPITAL_BASED_OUTPATIENT_CLINIC_OR_DEPARTMENT_OTHER): Payer: Self-pay | Admitting: General Surgery

## 2021-03-13 ENCOUNTER — Ambulatory Visit (HOSPITAL_COMMUNITY): Payer: BC Managed Care – PPO

## 2021-03-13 ENCOUNTER — Ambulatory Visit (HOSPITAL_BASED_OUTPATIENT_CLINIC_OR_DEPARTMENT_OTHER)
Admission: RE | Admit: 2021-03-13 | Discharge: 2021-03-13 | Disposition: A | Discharge status: Home / Self Care | Payer: BC Managed Care – PPO | Attending: General Surgery | Admitting: General Surgery

## 2021-03-13 DIAGNOSIS — Z79810 Long term (current) use of selective estrogen receptor modulators (SERMs): Secondary | ICD-10-CM | POA: Diagnosis not present

## 2021-03-13 DIAGNOSIS — C50412 Malignant neoplasm of upper-outer quadrant of left female breast: Secondary | ICD-10-CM | POA: Diagnosis not present

## 2021-03-13 DIAGNOSIS — I10 Essential (primary) hypertension: Secondary | ICD-10-CM | POA: Diagnosis not present

## 2021-03-13 DIAGNOSIS — Z170 Estrogen receptor positive status [ER+]: Secondary | ICD-10-CM | POA: Diagnosis not present

## 2021-03-13 DIAGNOSIS — Z419 Encounter for procedure for purposes other than remedying health state, unspecified: Secondary | ICD-10-CM

## 2021-03-13 DIAGNOSIS — J939 Pneumothorax, unspecified: Secondary | ICD-10-CM | POA: Diagnosis not present

## 2021-03-13 DIAGNOSIS — C50912 Malignant neoplasm of unspecified site of left female breast: Secondary | ICD-10-CM | POA: Diagnosis not present

## 2021-03-13 HISTORY — PX: PORTACATH PLACEMENT: SHX2246

## 2021-03-13 IMAGING — RF DG CHEST 1V
1 series · 1 of 1 positions shown · non-contrast
Comparison: None.

CLINICAL DATA: Port-A-Cath placement.

EXAM:
CHEST  1 VIEW

[Series 1: run · 1 of 1 slices shown]
[im 1/1]
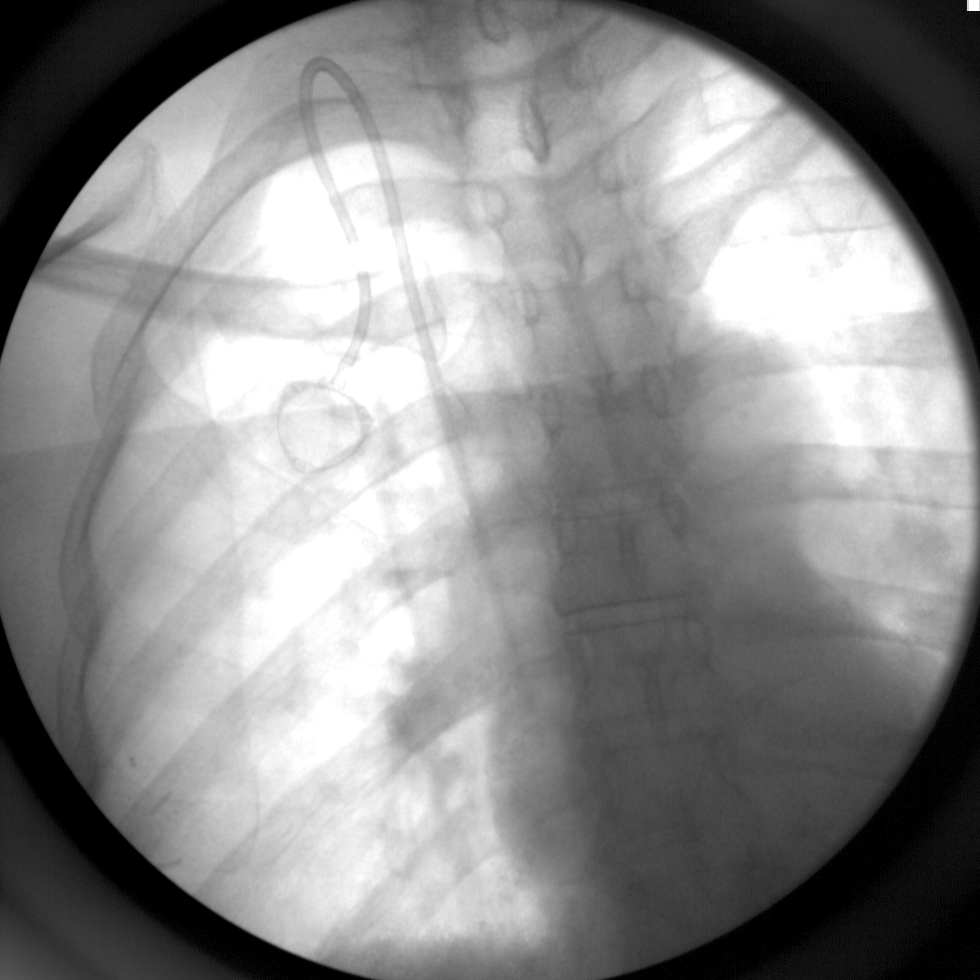

[1 of 1 positions shown; findings below may reference images not displayed]

FINDINGS: Single fluoroscopic spot view of the upper right chest shows a
Port-A-Cath in the right IJ, terminating at the SVC RA junction.
Assess for pneumothorax is limited by technique.
IMPRESSION: Right IJ Port-A-Cath terminates at the SVC RA junction. Assessment
for pneumothorax is markedly limited by technique.

## 2021-03-13 SURGERY — INSERTION, TUNNELED CENTRAL VENOUS DEVICE, WITH PORT
Anesthesia: General | Site: Chest | Laterality: Right

## 2021-03-13 MED ORDER — HYDROMORPHONE HCL 1 MG/ML IJ SOLN
0.2500 mg | INTRAMUSCULAR | Status: DC | PRN
  Administered 2021-03-13: 0.5 mg via INTRAVENOUS

## 2021-03-13 MED ORDER — LIDOCAINE 2% (20 MG/ML) 5 ML SYRINGE
INTRAMUSCULAR | Status: AC
  Filled 2021-03-13: qty 5

## 2021-03-13 MED ORDER — FENTANYL CITRATE (PF) 100 MCG/2ML IJ SOLN
INTRAMUSCULAR | Status: AC
  Filled 2021-03-13: qty 2

## 2021-03-13 MED ORDER — MIDAZOLAM HCL 5 MG/5ML IJ SOLN
INTRAMUSCULAR | Status: DC | PRN
  Administered 2021-03-13: 1 mg via INTRAVENOUS

## 2021-03-13 MED ORDER — ONDANSETRON HCL 4 MG/2ML IJ SOLN
INTRAMUSCULAR | Status: AC
  Filled 2021-03-13: qty 2

## 2021-03-13 MED ORDER — FENTANYL CITRATE (PF) 100 MCG/2ML IJ SOLN
INTRAMUSCULAR | Status: DC | PRN
  Administered 2021-03-13: 50 ug via INTRAVENOUS

## 2021-03-13 MED ORDER — AMISULPRIDE (ANTIEMETIC) 5 MG/2ML IV SOLN: 10.0000 mg | Freq: Once | INTRAVENOUS | Status: DC | PRN

## 2021-03-13 MED ORDER — LIDOCAINE HCL (CARDIAC) PF 100 MG/5ML IV SOSY
PREFILLED_SYRINGE | INTRAVENOUS | Status: DC | PRN
  Administered 2021-03-13: 40 mg via INTRAVENOUS

## 2021-03-13 MED ORDER — DEXAMETHASONE SODIUM PHOSPHATE 10 MG/ML IJ SOLN
INTRAMUSCULAR | Status: AC
  Filled 2021-03-13: qty 1

## 2021-03-13 MED ORDER — BUPIVACAINE HCL (PF) 0.25 % IJ SOLN
INTRAMUSCULAR | Status: DC | PRN
  Administered 2021-03-13: 8 mL

## 2021-03-13 MED ORDER — OXYCODONE HCL 5 MG PO TABS: 5.0000 mg | ORAL_TABLET | Freq: Once | ORAL | Status: DC | PRN

## 2021-03-13 MED ORDER — PROMETHAZINE HCL 25 MG/ML IJ SOLN: 6.2500 mg | INTRAMUSCULAR | Status: DC | PRN

## 2021-03-13 MED ORDER — OXYCODONE HCL 5 MG/5ML PO SOLN: 5.0000 mg | Freq: Once | ORAL | Status: DC | PRN

## 2021-03-13 MED ORDER — HYDROMORPHONE HCL 1 MG/ML IJ SOLN
INTRAMUSCULAR | Status: AC
  Filled 2021-03-13: qty 0.5

## 2021-03-13 MED ORDER — HEPARIN SOD (PORK) LOCK FLUSH 100 UNIT/ML IV SOLN
INTRAVENOUS | Status: DC | PRN
  Administered 2021-03-13: 500 [IU] via INTRAVENOUS

## 2021-03-13 MED ORDER — ACETAMINOPHEN 500 MG PO TABS
ORAL_TABLET | ORAL | Status: AC
Start: 2021-03-13 — End: ?
  Filled 2021-03-13: qty 2

## 2021-03-13 MED ORDER — ACETAMINOPHEN 500 MG PO TABS
1000.0000 mg | ORAL_TABLET | ORAL | Status: AC
  Administered 2021-03-13: 1000 mg via ORAL

## 2021-03-13 MED ORDER — PROPOFOL 10 MG/ML IV BOLUS
INTRAVENOUS | Status: DC | PRN
  Administered 2021-03-13: 200 mg via INTRAVENOUS

## 2021-03-13 MED ORDER — CEFAZOLIN SODIUM-DEXTROSE 2-4 GM/100ML-% IV SOLN
2.0000 g | INTRAVENOUS | Status: AC
  Administered 2021-03-13: 2 g via INTRAVENOUS

## 2021-03-13 MED ORDER — LACTATED RINGERS IV SOLN: INTRAVENOUS | Status: DC

## 2021-03-13 MED ORDER — ONDANSETRON 4 MG PO TBDP
4.0000 mg | ORAL_TABLET | Freq: Once | ORAL | Status: AC
Start: 2021-03-13 — End: 2021-03-13
  Administered 2021-03-13: 4 mg via ORAL

## 2021-03-13 MED ORDER — MIDAZOLAM HCL 2 MG/2ML IJ SOLN
INTRAMUSCULAR | Status: AC
  Filled 2021-03-13: qty 2

## 2021-03-13 MED ORDER — MEPERIDINE HCL 25 MG/ML IJ SOLN: 6.2500 mg | INTRAMUSCULAR | Status: DC | PRN

## 2021-03-13 MED ORDER — PROPOFOL 10 MG/ML IV BOLUS
INTRAVENOUS | Status: AC
Start: 2021-03-13 — End: ?
  Filled 2021-03-13: qty 20

## 2021-03-13 MED ORDER — HEPARIN (PORCINE) IN NACL 2-0.9 UNITS/ML
INTRAMUSCULAR | Status: AC | PRN
  Administered 2021-03-13: 500 mL via INTRAVENOUS

## 2021-03-13 MED ORDER — DEXAMETHASONE SODIUM PHOSPHATE 10 MG/ML IJ SOLN
INTRAMUSCULAR | Status: DC | PRN
Start: 2021-03-13 — End: 2021-03-13
  Administered 2021-03-13: 4 mg via INTRAVENOUS

## 2021-03-13 MED ORDER — CEFAZOLIN SODIUM-DEXTROSE 2-4 GM/100ML-% IV SOLN
INTRAVENOUS | Status: AC
  Filled 2021-03-13: qty 100

## 2021-03-13 MED ORDER — TRAMADOL HCL 50 MG PO TABS
100.0000 mg | ORAL_TABLET | Freq: Four times a day (QID) | ORAL | 0 refills | Status: DC | PRN
Start: 2021-03-13 — End: 2021-03-23

## 2021-03-13 MED ORDER — ONDANSETRON 4 MG PO TBDP
ORAL_TABLET | ORAL | Status: AC
  Filled 2021-03-13: qty 1

## 2021-03-13 MED ORDER — ONDANSETRON HCL 4 MG/2ML IJ SOLN
INTRAMUSCULAR | Status: DC | PRN
Start: 2021-03-13 — End: 2021-03-13
  Administered 2021-03-13: 4 mg via INTRAVENOUS

## 2021-03-13 MED FILL — Dexamethasone Sodium Phosphate Inj 100 MG/10ML: INTRAMUSCULAR | Qty: 1 | Status: AC

## 2021-03-13 MED FILL — Fosaprepitant Dimeglumine For IV Infusion 150 MG (Base Eq): INTRAVENOUS | Qty: 5 | Status: AC

## 2021-03-13 SURGICAL SUPPLY — 51 items
ADH SKN CLS APL DERMABOND .7 (GAUZE/BANDAGES/DRESSINGS) ×1
APL PRP STRL LF DISP 70% ISPRP (MISCELLANEOUS) ×1
APL SKNCLS STERI-STRIP NONHPOA (GAUZE/BANDAGES/DRESSINGS)
BAG DECANTER FOR FLEXI CONT (MISCELLANEOUS) ×2 IMPLANT
BENZOIN TINCTURE PRP APPL 2/3 (GAUZE/BANDAGES/DRESSINGS) ×1 IMPLANT
BLADE SURG 11 STRL SS (BLADE) ×2 IMPLANT
BLADE SURG 15 STRL LF DISP TIS (BLADE) ×1 IMPLANT
BLADE SURG 15 STRL SS (BLADE) ×2
CANISTER SUCT 1200ML W/VALVE (MISCELLANEOUS) IMPLANT
CHLORAPREP W/TINT 26 (MISCELLANEOUS) ×2 IMPLANT
COVER BACK TABLE 60X90IN (DRAPES) ×2 IMPLANT
COVER MAYO STAND STRL (DRAPES) ×2 IMPLANT
COVER PROBE 5X48 (MISCELLANEOUS) ×2
DERMABOND ADVANCED (GAUZE/BANDAGES/DRESSINGS) ×1
DERMABOND ADVANCED .7 DNX12 (GAUZE/BANDAGES/DRESSINGS) ×1 IMPLANT
DRAPE C-ARM 42X72 X-RAY (DRAPES) ×2 IMPLANT
DRAPE LAPAROSCOPIC ABDOMINAL (DRAPES) ×2 IMPLANT
DRAPE UTILITY XL STRL (DRAPES) ×2 IMPLANT
DRSG TEGADERM 4X4.75 (GAUZE/BANDAGES/DRESSINGS) ×1 IMPLANT
ELECT COATED BLADE 2.86 ST (ELECTRODE) ×2 IMPLANT
ELECT REM PT RETURN 9FT ADLT (ELECTROSURGICAL) ×2
ELECTRODE REM PT RTRN 9FT ADLT (ELECTROSURGICAL) ×1 IMPLANT
GAUZE SPONGE 4X4 12PLY STRL LF (GAUZE/BANDAGES/DRESSINGS) ×2 IMPLANT
GLOVE SURG ENC MOIS LTX SZ7 (GLOVE) ×2 IMPLANT
GLOVE SURG UNDER POLY LF SZ7 (GLOVE) ×1 IMPLANT
GLOVE SURG UNDER POLY LF SZ7.5 (GLOVE) ×2 IMPLANT
GOWN STRL REUS W/ TWL LRG LVL3 (GOWN DISPOSABLE) ×2 IMPLANT
GOWN STRL REUS W/TWL LRG LVL3 (GOWN DISPOSABLE) ×4
IV KIT MINILOC 20X1 SAFETY (NEEDLE) IMPLANT
KIT CVR 48X5XPRB PLUP LF (MISCELLANEOUS) IMPLANT
KIT PORT POWER 8FR ISP CVUE (Port) ×1 IMPLANT
NDL HYPO 25X1 1.5 SAFETY (NEEDLE) ×1 IMPLANT
NDL SAFETY ECLIPSE 18X1.5 (NEEDLE) IMPLANT
NEEDLE HYPO 18GX1.5 SHARP (NEEDLE)
NEEDLE HYPO 25X1 1.5 SAFETY (NEEDLE) ×2 IMPLANT
PACK BASIN DAY SURGERY FS (CUSTOM PROCEDURE TRAY) ×2 IMPLANT
PENCIL SMOKE EVACUATOR (MISCELLANEOUS) ×2 IMPLANT
SLEEVE SCD COMPRESS KNEE MED (STOCKING) ×2 IMPLANT
SPIKE FLUID TRANSFER (MISCELLANEOUS) IMPLANT
STRIP CLOSURE SKIN 1/2X4 (GAUZE/BANDAGES/DRESSINGS) ×2 IMPLANT
SUT MNCRL AB 4-0 PS2 18 (SUTURE) ×2 IMPLANT
SUT PROLENE 2 0 SH DA (SUTURE) ×2 IMPLANT
SUT SILK 2 0 TIES 17X18 (SUTURE)
SUT SILK 2-0 18XBRD TIE BLK (SUTURE) IMPLANT
SUT VIC AB 3-0 SH 27 (SUTURE) ×2
SUT VIC AB 3-0 SH 27X BRD (SUTURE) ×1 IMPLANT
SYR 5ML LUER SLIP (SYRINGE) ×2 IMPLANT
SYR CONTROL 10ML LL (SYRINGE) ×2 IMPLANT
TOWEL GREEN STERILE FF (TOWEL DISPOSABLE) ×2 IMPLANT
TUBE CONNECTING 20X1/4 (TUBING) IMPLANT
YANKAUER SUCT BULB TIP NO VENT (SUCTIONS) IMPLANT

## 2021-03-13 NOTE — Interval H&P Note (Signed)
History and Physical Interval Note:  03/13/2021 10:57 AM  Jasmine Maddox  has presented today for surgery, with the diagnosis of BREAST CANCER.  The various methods of treatment have been discussed with the patient and family. After consideration of risks, benefits and other options for treatment, the patient has consented to  Procedure(s): INSERTION PORT-A-CATH (N/A) as a surgical intervention.  The patient's history has been reviewed, patient examined, no change in status, stable for surgery.  I have reviewed the patient's chart and labs.  Questions were answered to the patient's satisfaction.     Rolm Bookbinder

## 2021-03-13 NOTE — Anesthesia Postprocedure Evaluation (Signed)
Anesthesia Post Note  Patient: Jasmine Maddox  Procedure(s) Performed: INSERTION PORT-A-CATH (Right: Chest)     Patient location during evaluation: PACU Anesthesia Type: General Level of consciousness: awake and alert Pain management: pain level controlled Vital Signs Assessment: post-procedure vital signs reviewed and stable Respiratory status: spontaneous breathing, nonlabored ventilation and respiratory function stable Cardiovascular status: blood pressure returned to baseline and stable Postop Assessment: no apparent nausea or vomiting Anesthetic complications: no   No notable events documented.  Last Vitals:  Vitals:   03/13/21 1245 03/13/21 1250  BP: (!) 145/76 (!) 158/83  Pulse: 63 (!) 56  Resp: (!) 22 18  Temp:  (!) 35.9 C  SpO2: 99% 99%    Last Pain:  Vitals:   03/13/21 1250  TempSrc:   PainSc: 0-No pain                 Lynda Rainwater

## 2021-03-13 NOTE — Assessment & Plan Note (Signed)
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, PR 0%, Ki-67 40%, HER2 2+ equivocal by IHC, FISH negative Lymph node biopsy: Benign  Breast MRI 02/12/2021: 4 suspicious masses within the left breast consistent with multifocal, multicentric disease 2 of the 4 masses have been biopsied (2 cm, 1.3 cm, 1.2 cm, 1.7 cm) for mildly prominent level 1 lymph nodes are identified  Treatment plan: 1.  Neoadjuvant chemotherapy with dose dense Adriamycin Cytoxan followed by Taxol   2. mastectomy with sentinel lymph node biopsy 3.  Adjuvant radiation 4.  Followed by adjuvant antiestrogen therapy ------------------------------------------------------------------------------------------ Current Treatment: Cycle 1 DD Adriamycin and Cytoxan Labs reviewed Chemo education completed RTC in 1 week for tox check

## 2021-03-13 NOTE — H&P (Signed)
61 y.o. female who is seen today for new left breast cancer. 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 She does have a significant family history. She had genetic testing in 2020 that was negative. She had no mass or discharge. She underwent screening mammogram that showed multiple upper outer quadrant masses measuring 5 to 20 mm in size. The dominant mass is a 1.2 cm mass with calcs that measures 2.3 cm total with the calcifications. There are numerous other masses that are present by ultrasound. There are a total of 4 of these 2 of which have been biopsied she had an enlarged axillary node that was biopsied and is benign and concordant. Biopsy of 2 of the breast lesions at either end or grade 3 invasive ductal carcinoma that is ER positive at 40%, PR negative, HER2 negative, and Ki-67 is 40%. She is here with her husband to discuss options today.   Review of Systems: A complete review of systems was obtained from the patient. I have reviewed this information and discussed as appropriate with the patient. See HPI as well for other ROS.  Review of Systems  All other systems reviewed and are negative.   Medical History: Past Medical History:  Diagnosis Date   History of cancer   Hypertension   Patient Active Problem List  Diagnosis   Essential hypertension, benign   Ductal carcinoma in situ (DCIS) of right breast   Past Surgical History:  Procedure Laterality Date   MASTECTOMY PARTIAL / LUMPECTOMY  dcis   No Known Allergies  Current Outpatient Medications on File Prior to Visit  Medication Sig Dispense Refill   tamoxifen (NOLVADEX) 20 MG tablet 1 tablet   lisinopriL (ZESTRIL) 20 MG tablet Take 20 mg by mouth once daily    Family History  Problem Relation Age of Onset   Breast cancer Mother   Obesity Sister   Diabetes Sister   Breast cancer Sister    Social History    Tobacco Use  Smoking Status Never  Smokeless Tobacco Never    Social History   Socioeconomic History   Marital status: Married  Tobacco Use   Smoking status: Never   Smokeless tobacco: Never  Substance and Sexual Activity   Alcohol use: Yes  Comment: occassionally   Drug use: Never   Objective:   Vitals:  Body mass index is 26.82 kg/m.  Physical Exam Constitutional:  Appearance: Normal appearance.  Cardiovascular:  Rate and Rhythm: Normal rate.  Pulmonary:  Effort: Pulmonary effort is normal.  Chest:  Breasts: Right: No inverted nipple, mass or nipple discharge.  Left: No inverted nipple, mass or nipple discharge.  Lymphadenopathy:  Upper Body:  Right upper body: No supraclavicular or axillary adenopathy.  Left upper body: No supraclavicular or axillary adenopathy.  Neurological:  Mental Status: She is alert.     Assessment and Plan:   Carcinoma of upper-outer quadrant of left breast in female, estrogen receptor positive (CMS-HCC)  Primary systemic therapy, port placement.   We discussed the staging and pathophysiology of breast cancer. We discussed all of the different options for treatment for breast cancer including surgery, chemotherapy, radiation therapy, Herceptin, and antiestrogen therapy. We discussed a sentinel lymph node biopsy as she does not appear to having lymph node involvement right now. We discussed the performance of that with injection of Magtrace. We discussed that there is a chance of having a positive  node with a sentinel lymph node biopsy and we will await the permanent pathology to make any other first further decisions in terms of her treatment. We discussed up to a 5% risk lifetime of chronic shoulder pain as well as lymphedema associated with a sentinel lymph node biopsy. We discussed the options for treatment of the breast cancer which included lumpectomy versus a mastectomy. We discussed the performance of the lumpectomy with  radioactive seed placement. We discussed a 5-10% chance of a positive margin requiring reexcision in the operating room. We also discussed that she will likely need radiation therapy if she undergoes lumpectomy. We discussed mastectomy and the postoperative care for that as well. Mastectomy can be followed by reconstruction. The decision for lumpectomy vs mastectomy has no impact on decision for chemotherapy. Most mastectomy patients will not need radiation therapy. We discussed that there is no difference in her survival whether she undergoes lumpectomy with radiation therapy or antiestrogen therapy versus a mastectomy. There is also no real difference between her recurrence in the breast. We discussed the risks of operation including bleeding, infection, possible reoperation. She understands her further therapy will be based on what her stages at the time of her operation. breast

## 2021-03-13 NOTE — Anesthesia Preprocedure Evaluation (Signed)
Anesthesia Evaluation  Patient identified by MRN, date of birth, ID band Patient awake    Reviewed: Allergy & Precautions, NPO status , Patient's Chart, lab work & pertinent test results  Airway Mallampati: II  TM Distance: >3 FB Neck ROM: Full    Dental  (+) Teeth Intact   Pulmonary    breath sounds clear to auscultation       Cardiovascular hypertension, Pt. on medications  Rhythm:Regular Rate:Normal     Neuro/Psych    GI/Hepatic   Endo/Other    Renal/GU      Musculoskeletal   Abdominal   Peds  Hematology   Anesthesia Other Findings Breast Cancer  Reproductive/Obstetrics                             Anesthesia Physical  Anesthesia Plan  ASA: 3  Anesthesia Plan: General   Post-op Pain Management: Minimal or no pain anticipated   Induction: Intravenous  PONV Risk Score and Plan: 3 and Ondansetron, Dexamethasone, Midazolam and Treatment may vary due to age or medical condition  Airway Management Planned: LMA  Additional Equipment:   Intra-op Plan:   Post-operative Plan: Extubation in OR  Informed Consent: I have reviewed the patients History and Physical, chart, labs and discussed the procedure including the risks, benefits and alternatives for the proposed anesthesia with the patient or authorized representative who has indicated his/her understanding and acceptance.     Dental advisory given  Plan Discussed with: CRNA and Anesthesiologist  Anesthesia Plan Comments:         Anesthesia Quick Evaluation

## 2021-03-13 NOTE — Discharge Instructions (Addendum)
PORT-A-CATH: POST OP INSTRUCTIONS  Always review your discharge instruction sheet given to you by the facility where your surgery was performed.   A prescription for pain medication may be given to you upon discharge. Take your pain medication as prescribed, if needed. If narcotic pain medicine is not needed, then you make take acetaminophen (Tylenol) or ibuprofen (Advil) as needed. NEXT DOSE OF TYLENOL AFTER 3:15 PM AS NEEDED FOR PAIN. Take your usually prescribed medications unless otherwise directed. If you need a refill on your pain medication, please contact our office. All narcotic pain medicine now requires a paper prescription.  Phoned in and fax refills are no longer allowed by law.  Prescriptions will not be filled after 5 pm or on weekends.  You should follow a light diet for the remainder of the day after your procedure. Most patients will experience some mild swelling and/or bruising in the area of the incision. It may take several days to resolve. It is common to experience some constipation if taking pain medication after surgery. Increasing fluid intake and taking a stool softener (such as Colace) will usually help or prevent this problem from occurring. A mild laxative (Milk of Magnesia or Miralax) should be taken according to package directions if there are no bowel movements after 48 hours.  Unless discharge instructions indicate otherwise, you may remove your bandages 48 hours after surgery, and you may shower at that time. You may have steri-strips (small white skin tapes) in place directly over the incision.  These strips should be left on the skin for 7-10 days.  If your surgeon used Dermabond (skin glue) on the incision, you may shower in 24 hours.  The glue will flake off over the next 2-3 weeks.  If your port is left accessed at the end of surgery (needle left in port), the dressing cannot get wet and should only by changed by a healthcare professional. When the port is no  longer accessed (when the needle has been removed), follow step 7.   ACTIVITIES:  Limit activity involving your arms for the next 72 hours. Do no strenuous exercise or activity for 1 week. You may drive when you are no longer taking prescription pain medication, you can comfortably wear a seatbelt, and you can maneuver your car. 10.You may need to see your doctor in the office for a follow-up appointment.  Please       check with your doctor.  11.When you receive a new Port-a-Cath, you will get a product guide and        ID card.  Please keep them in case you need them.  WHEN TO CALL YOUR DOCTOR [phone]): Fever over 101.0 Chills Continued bleeding from incision Increased redness and tenderness at the site Shortness of breath, difficulty breathing   The clinic staff is available to answer your questions during regular business hours. Please dont hesitate to call and ask to speak to one of the nurses or medical assistants for clinical concerns. If you have a medical emergency, go to the nearest emergency room or call 911.  A surgeon from [facility] is always on call at the hospital.     For further information, please visit [email or web address]     Post Anesthesia Home Care Instructions  Activity: Get plenty of rest for the remainder of the day. A responsible individual must stay with you for 24 hours following the procedure.  For the next 24 hours, DO NOT: -Drive a car Film/video editor -  Drink alcoholic beverages -Take any medication unless instructed by your physician -Make any legal decisions or sign important papers.  Meals: Start with liquid foods such as gelatin or soup. Progress to regular foods as tolerated. Avoid greasy, spicy, heavy foods. If nausea and/or vomiting occur, drink only clear liquids until the nausea and/or vomiting subsides. Call your physician if vomiting continues.  Special Instructions/Symptoms: Your throat may feel dry  or sore from the anesthesia or the breathing tube placed in your throat during surgery. If this causes discomfort, gargle with warm salt water. The discomfort should disappear within 24 hours.  If you had a scopolamine patch placed behind your ear for the management of post- operative nausea and/or vomiting:  1. The medication in the patch is effective for 72 hours, after which it should be removed.  Wrap patch in a tissue and discard in the trash. Wash hands thoroughly with soap and water. 2. You may remove the patch earlier than 72 hours if you experience unpleasant side effects which may include dry mouth, dizziness or visual disturbances. 3. Avoid touching the patch. Wash your hands with soap and water after contact with the patch.

## 2021-03-13 NOTE — Progress Notes (Signed)
Patient Care Team: [name], PA as PCP - General (Family Medicine) [name], MD as Consulting Physician (General Surgery) [name], MD as Consulting Physician (Hematology and Oncology) [name], MD as Consulting Physician (Radiation Oncology) Rockwell Germany, RN as Oncology Nurse Navigator [name], RN as Oncology Nurse Navigator  DIAGNOSIS:    ICD-10-CM   1. Malignant neoplasm of upper-outer quadrant of left breast in female, estrogen receptor positive ([place])  C50.412    Z17.0       SUMMARY OF ONCOLOGIC HISTORY: Oncology History  Ductal carcinoma in situ (DCIS) of right breast  01/30/2018 Mammogram   Diagnostic Mammogram  0.4cm cluster of calcifications, 12 o'clock position, 4cm fn, right breast   02/12/2018 Initial Diagnosis   Screening detected right breast calcifications 4 mm size UOQ 11:30 position stereotactic biopsy revealed high-grade DCIS ER 90%, PR 10%, Tis NX stage 0   02/18/2018 Genetic Testing   Negative.  Genes tested include: ATM, BRCA1, BRCA2, CDH1, CHEK2, PALB2, PTEN, STK11 and TP53; APC, ATM, AXIN2, BARD1, BMPR1A, BRCA1, BRCA2, BRIP1, CDH1, CDKN2A (p14ARF), CDKN2A (p16INK4a), CKD4, CHEK2, CTNNA1, DICER1, EPCAM (Deletion/duplication testing only), GREM1 (promoter region deletion/duplication testing only), KIT, MEN1, MLH1, MSH2, MSH3, MSH6, MUTYH, NBN, NF1, NHTL1, PALB2, PDGFRA, PMS2, POLD1, POLE, PTEN, RAD50, RAD51C, RAD51D, SDHB, SDHC, SDHD, SMAD4, SMARCA4. STK11, TP53, TSC1, TSC2, and VHL.  The following genes were evaluated for sequence changes only: SDHA and HOXB13 c.251G>A variant only.   03/20/2018 Surgery   Right lumpectomy: Scattered microscopic foci of DCIS intermediate grade, margins negative, ER 90%, PR 10%, Tis NX stage 0   04/01/2018 Cancer Staging   Staging form: Breast, AJCC 8th Edition - Pathologic: Stage 0 (pTis (DCIS), pN0, cM0, ER+, PR+) - Signed by Gardenia Phlegm, NP on 04/01/2018    04/28/2018 - 05/26/2018  Radiation Therapy   Adjuvant radiation 1. Right breast; 15 fractions of 2.67 Gy for a total of 40.05 Gy 2. Boost; 5 fractions of 2 Gy for a total of 10 Gy     05/2018 -  Anti-estrogen oral therapy   Tamoxifen daily   Malignant neoplasm of upper-outer quadrant of left breast in female, estrogen receptor positive ([place])  01/19/2021 Initial Diagnosis   Left breast biopsy: 2:00 and 4:00: Grade 3 IDC ER 40% weak, PR 0%, Ki-67 40%, HER2 2+ equivocal by IHC, FISH negative Lymph node biopsy: Benign   02/12/2021 Breast MRI   Breast MRI 02/12/2021: 4 suspicious masses within the left breast consistent with multifocal, multicentric disease 2 of the 4 masses have been biopsied (2 cm, 1.3 cm, 1.2 cm, 1.7 cm) for mildly prominent level 1 lymph nodes are identified   03/14/2021 -  Chemotherapy   Patient is on Treatment Plan : BREAST ADJUVANT DOSE DENSE AC q14d / PACLitaxel q7d       CHIEF COMPLIANT: Cycle 1 AC  INTERVAL HISTORY: Jasmine Maddox is a 61 y.o. with above-mentioned history of right breast DCIS who underwent a lumpectomy, radiation, currently on antiestrogen therapy with tamoxifen. She presents to the clinic today to start chemotherapy with [facility].  She is slightly anxious but ready to start her treatment today.  ALLERGIES:  has No Known Allergies.  MEDICATIONS:  Current Outpatient Medications  Medication Sig Dispense Refill   dexamethasone (DECADRON) 4 MG tablet Take 1 tablet (4 mg total) by mouth daily. Take 1 tablet day after chemo and 1 tablet 2 days after chemo with food 8 tablet 0   lidocaine-prilocaine (EMLA) cream Apply to  affected area once 30 g 3   lisinopril (PRINIVIL,ZESTRIL) 20 MG tablet Take 20 mg by mouth daily.     ondansetron (ZOFRAN) 8 MG tablet Take 1 tablet (8 mg total) by mouth 2 (two) times daily as needed. Start on the third day after chemotherapy. 30 tablet 1   prochlorperazine (COMPAZINE) 10 MG tablet Take 1 tablet (10 mg total) by mouth every 6 (six) hours as needed  (Nausea or vomiting). 30 tablet 1   tamoxifen (NOLVADEX) 20 MG tablet Take 20 mg by mouth daily.     traMADol (ULTRAM) 50 MG tablet Take 2 tablets (100 mg total) by mouth every 6 (six) hours as needed. 6 tablet 0   No current facility-administered medications for this visit.    PHYSICAL EXAMINATION: ECOG PERFORMANCE STATUS: 1 - Symptomatic but completely ambulatory  Vitals:   03/14/21 1119  BP: (!) 162/69  Pulse: 76  Resp: 18  Temp: 97.9 F (36.6 C)  SpO2: 100%   Filed Weights   03/14/21 1119  Weight: 147 lb 14.4 oz (67.1 kg)      LABORATORY DATA:  I have reviewed the data as listed CMP Latest Ref Rng & Units 02/18/2018  Glucose 70 - 99 mg/dL 94  BUN 6 - 20 mg/dL 10  Creatinine 0.44 - 1.00 mg/dL 0.77  Sodium 135 - 145 mmol/L 140  Potassium 3.5 - 5.1 mmol/L 4.0  Chloride 98 - 111 mmol/L 106  CO2 22 - 32 mmol/L 27  Calcium 8.9 - 10.3 mg/dL 9.5  Total Protein 6.5 - 8.1 g/dL 7.7  Total Bilirubin 0.3 - 1.2 mg/dL 0.4  Alkaline Phos 38 - 126 U/L 64  AST 15 - 41 U/L 28  ALT 0 - 44 U/L 32    Lab Results  Component Value Date   WBC 4.7 02/18/2018   HGB 12.5 02/18/2018   HCT 37.2 02/18/2018   MCV 88.6 02/18/2018   PLT 183 02/18/2018   NEUTROABS 2.0 02/18/2018    ASSESSMENT & PLAN:  Malignant neoplasm of upper-outer quadrant of left breast in female, estrogen receptor positive ([place]) 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, PR 0%, Ki-67 40%, HER2 2+ equivocal by IHC, FISH negative Lymph node biopsy: Benign   Breast MRI 02/12/2021: 4 suspicious masses within the left breast consistent with multifocal, multicentric disease 2 of the 4 masses have been biopsied (2 cm, 1.3 cm, 1.2 cm, 1.7 cm) for mildly prominent level 1 lymph nodes are identified  Treatment plan: 1.  Neoadjuvant chemotherapy with dose dense Adriamycin Cytoxan followed by Taxol   2. mastectomy with sentinel lymph node biopsy 3.  Adjuvant radiation 4.  Followed by adjuvant  antiestrogen therapy ------------------------------------------------------------------------------------------ Current Treatment: Cycle 1 DD Adriamycin and Cytoxan Labs reviewed Chemo education completed RTC in 1 week for tox check    No orders of the defined types were placed in this encounter.  The patient has a good understanding of the overall plan. she agrees with it. she will call with any problems that may develop before the next visit here.  Total time spent: 30 mins including face to face time and time spent for planning, charting and coordination of care  Rulon Eisenmenger, MD, MPH 03/14/2021  I, Thana Ates, am acting as scribe for Dr. [name].  I have reviewed the above documentation for accuracy and completeness, and I agree with the above.

## 2021-03-13 NOTE — Op Note (Signed)
Preoperative diagnosis: clinical stage II left breast cancer Postoperative diagnosis: Same as above Procedure: Right internal jugular port placement Surgeon: Dr. Serita Grammes Anesthesia: General Estimated blood loss: Minimal Specimens: None Drains: None Complications: None Special count was correct completion Disposition recovery stable edition  Indications: 61 y.o. female who is seen today for new left breast cancer. 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 She does have a significant family history. She had genetic testing in 2020 that was negative. She had no mass or discharge. She underwent screening mammogram that showed multiple upper outer quadrant masses measuring 5 to 20 mm in size. The dominant mass is a 1.2 cm mass with calcs that measures 2.3 cm total with the calcifications. There are numerous other masses that are present by ultrasound. There are a total of 4 of these 2 of which have been biopsied she had an enlarged axillary node that was biopsied and is benign and concordant. Biopsy of 2 of the breast lesions at either end or grade 3 invasive ductal carcinoma that is ER positive at 40%, PR negative, HER2 negative, and Ki-67 is 40%. After seeing oncology and me we elected to proceed with primary systemic therapy. I discussed port placement.   Procedure: After informed consent was obtained she was taken to the operating.  She was given antibiotics.  SCDs were in place.  She was placed under general anesthesia without complication.  She was prepped and draped in a standard sterile surgical fashion.  Surgical timeout was then performed.  I then identified her right internal jugular vein with the ultrasound.  I made a small nick in the skin.  I then accessed this on the first pass with the needle.  The wire was placed. This was confirmed to be in the vein by the ultrasound as well as  by fluoroscopy.  I then placed local into the area below her clavicle where the pocket was made.  I made incision and developed a pocket.  I then tunneled the line between the 2 sites.  The dilator was then placed over the wire.  This was done using fluoroscopy.  The wire was then removed.  The line was placed through the sheath and the sheath removed.  I pulled the line back to be in the distal cava near the cavoatrial junction.  This was a good position upon completion.  I then hooked the port up to the line.  I sutured this position with 2-0 Prolene suture.  I accessed the port and left accessed for treatment tomorrow. I aspirated blood and flushed easily.  I placed heparin in the port.  I then got a final completion film that showed this all to be in good position.  I then closed with 3-0 Vicryl and 4-0 Monocryl.  Glue was placed.  She tolerated this well and was transferred to recovery.

## 2021-03-13 NOTE — Transfer of Care (Signed)
Immediate Anesthesia Transfer of Care Note  Patient: Jasmine Maddox  Procedure(s) Performed: INSERTION PORT-A-CATH (Right: Chest)  Patient Location: PACU  Anesthesia Type:General  Level of Consciousness: drowsy  Airway & Oxygen Therapy: Patient Spontanous Breathing and Patient connected to face mask oxygen  Post-op Assessment: Report given to RN and Post -op Vital signs reviewed and stable  Post vital signs: Reviewed and stable  Last Vitals:  Vitals Value Taken Time  BP 161/88 03/13/21 1204  Temp    Pulse 61 03/13/21 1206  Resp 14 03/13/21 1206  SpO2 100 % 03/13/21 1206  Vitals shown include unvalidated device data.  Last Pain:  Vitals:   03/13/21 0911  TempSrc: Oral  PainSc: 0-No pain         Complications: No notable events documented.

## 2021-03-13 NOTE — Anesthesia Procedure Notes (Addendum)
Procedure Name: LMA Insertion Date/Time: 03/13/2021 11:21 AM Performed by: Lavonia Dana, CRNA Pre-anesthesia Checklist: Patient identified, Emergency Drugs available, Suction available and Patient being monitored Patient Re-evaluated:Patient Re-evaluated prior to induction Oxygen Delivery Method: Circle system utilized Preoxygenation: Pre-oxygenation with 100% oxygen Induction Type: IV induction Ventilation: Mask ventilation without difficulty LMA: LMA inserted LMA Size: 4.0 Laryngoscope Size: Mac and 4 Number of attempts: 1 Airway Equipment and Method: Bite block Placement Confirmation: positive ETCO2 Tube secured with: Tape Dental Injury: Teeth and Oropharynx as per pre-operative assessment

## 2021-03-13 NOTE — Progress Notes (Signed)
Called pt to introduce myself as her Financial Resource Specialist, discuss copay assistance and the [employer] grant.  I left a msg requesting she return my call if she's interested in applying for the grants. 

## 2021-03-14 ENCOUNTER — Inpatient Hospital Stay: Payer: BC Managed Care – PPO

## 2021-03-14 ENCOUNTER — Ambulatory Visit: Payer: BC Managed Care – PPO | Admitting: Hematology and Oncology

## 2021-03-14 ENCOUNTER — Other Ambulatory Visit: Payer: BC Managed Care – PPO

## 2021-03-14 ENCOUNTER — Encounter (HOSPITAL_BASED_OUTPATIENT_CLINIC_OR_DEPARTMENT_OTHER): Payer: Self-pay | Admitting: General Surgery

## 2021-03-14 ENCOUNTER — Inpatient Hospital Stay (HOSPITAL_BASED_OUTPATIENT_CLINIC_OR_DEPARTMENT_OTHER): Payer: BC Managed Care – PPO | Admitting: Hematology and Oncology

## 2021-03-14 ENCOUNTER — Encounter: Payer: Self-pay | Admitting: *Deleted

## 2021-03-14 DIAGNOSIS — Z170 Estrogen receptor positive status [ER+]: Secondary | ICD-10-CM | POA: Diagnosis not present

## 2021-03-14 DIAGNOSIS — C50412 Malignant neoplasm of upper-outer quadrant of left female breast: Secondary | ICD-10-CM

## 2021-03-14 DIAGNOSIS — Z5111 Encounter for antineoplastic chemotherapy: Secondary | ICD-10-CM | POA: Diagnosis not present

## 2021-03-14 DIAGNOSIS — Z5189 Encounter for other specified aftercare: Secondary | ICD-10-CM | POA: Diagnosis not present

## 2021-03-14 LAB — CMP (CANCER CENTER ONLY)
ALT: 47 U/L — ABNORMAL HIGH (ref 0–44)
AST: 30 U/L (ref 15–41)
Albumin: 3.8 g/dL (ref 3.5–5.0)
Alkaline Phosphatase: 54 U/L (ref 38–126)
Anion gap: 5 (ref 5–15)
BUN: 11 mg/dL (ref 6–20)
CO2: 29 mmol/L (ref 22–32)
Calcium: 8.8 mg/dL — ABNORMAL LOW (ref 8.9–10.3)
Chloride: 106 mmol/L (ref 98–111)
Creatinine: 0.75 mg/dL (ref 0.44–1.00)
GFR, Estimated: >60 mL/min (ref >60)
Glucose, Bld: 117 mg/dL — ABNORMAL HIGH (ref 70–99)
Potassium: 3.6 mmol/L (ref 3.5–5.1)
Sodium: 140 mmol/L (ref 135–145)
Total Bilirubin: 0.4 mg/dL (ref 0.3–1.2)
Total Protein: 6.9 g/dL (ref 6.5–8.1)

## 2021-03-14 LAB — CBC WITH DIFFERENTIAL (CANCER CENTER ONLY)
Abs Immature Granulocytes: 0.02 10*3/uL (ref 0.00–0.07)
Basophils Absolute: 0 10*3/uL (ref 0.0–0.1)
Basophils Relative: 0 %
Eosinophils Absolute: 0 10*3/uL (ref 0.0–0.5)
Eosinophils Relative: 0 %
HCT: 35 % — ABNORMAL LOW (ref 36.0–46.0)
Hemoglobin: 12 g/dL (ref 12.0–15.0)
Immature Granulocytes: 0 %
Lymphocytes Relative: 30 %
Lymphs Abs: 2.6 10*3/uL (ref 0.7–4.0)
MCH: 30.4 pg (ref 26.0–34.0)
MCHC: 34.3 g/dL (ref 30.0–36.0)
MCV: 88.6 fL (ref 80.0–100.0)
Monocytes Absolute: 0.6 10*3/uL (ref 0.1–1.0)
Monocytes Relative: 7 %
Neutro Abs: 5.4 10*3/uL (ref 1.7–7.7)
Neutrophils Relative %: 63 %
Platelet Count: 195 10*3/uL (ref 150–400)
RBC: 3.95 MIL/uL (ref 3.87–5.11)
RDW: 11.9 % (ref 11.5–15.5)
WBC Count: 8.7 10*3/uL (ref 4.0–10.5)
nRBC: 0 % (ref 0.0–0.2)

## 2021-03-14 MED ORDER — SODIUM CHLORIDE 0.9 % IV SOLN: Freq: Once | INTRAVENOUS | Status: AC

## 2021-03-14 MED ORDER — SODIUM CHLORIDE 0.9 % IV SOLN
600.0000 mg/m2 | Freq: Once | INTRAVENOUS | Status: AC
  Administered 2021-03-14: 1040 mg via INTRAVENOUS
  Filled 2021-03-14: qty 52

## 2021-03-14 MED ORDER — DOXORUBICIN HCL CHEMO IV INJECTION 2 MG/ML
60.0000 mg/m2 | Freq: Once | INTRAVENOUS | Status: AC
  Administered 2021-03-14: 104 mg via INTRAVENOUS
  Filled 2021-03-14: qty 52

## 2021-03-14 MED ORDER — SODIUM CHLORIDE 0.9 % IV SOLN
150.0000 mg | Freq: Once | INTRAVENOUS | Status: AC
  Administered 2021-03-14: 150 mg via INTRAVENOUS
  Filled 2021-03-14: qty 150

## 2021-03-14 MED ORDER — SODIUM CHLORIDE 0.9% FLUSH
10.0000 mL | INTRAVENOUS | Status: DC | PRN
  Administered 2021-03-14: 10 mL

## 2021-03-14 MED ORDER — SODIUM CHLORIDE 0.9 % IV SOLN
10.0000 mg | Freq: Once | INTRAVENOUS | Status: AC
  Administered 2021-03-14: 10 mg via INTRAVENOUS
  Filled 2021-03-14: qty 10

## 2021-03-14 MED ORDER — HEPARIN SOD (PORK) LOCK FLUSH 100 UNIT/ML IV SOLN
500.0000 [IU] | Freq: Once | INTRAVENOUS | Status: AC | PRN
  Administered 2021-03-14: 500 [IU]

## 2021-03-14 MED ORDER — PALONOSETRON HCL INJECTION 0.25 MG/5ML
0.2500 mg | Freq: Once | INTRAVENOUS | Status: AC
  Administered 2021-03-14: 0.25 mg via INTRAVENOUS
  Filled 2021-03-14: qty 5

## 2021-03-14 NOTE — Patient Instructions (Signed)
[facility]  Discharge Instructions: Thank you for choosing [facility] to provide your oncology and hematology care.   If you have a lab appointment with the Center, please go directly to the [facility] and check in at the registration area.   Wear comfortable clothing and clothing appropriate for easy access to any Portacath or PICC line.   We strive to give you quality time with your provider. You may need to reschedule your appointment if you arrive late (15 or more minutes).  Arriving late affects you and other patients whose appointments are after yours.  Also, if you miss three or more appointments without notifying the office, you may be dismissed from the clinic at the providers discretion.      For prescription refill requests, have your pharmacy contact our office and allow 72 hours for refills to be completed.    Today you received the following chemotherapy and/or immunotherapy agents : Adriamycin, Cytoxan      To help prevent nausea and vomiting after your treatment, we encourage you to take your nausea medication as directed.  BELOW ARE SYMPTOMS THAT SHOULD BE REPORTED IMMEDIATELY: *FEVER GREATER THAN 100.4 F (38 C) OR HIGHER *CHILLS OR SWEATING *NAUSEA AND VOMITING THAT IS NOT CONTROLLED WITH YOUR NAUSEA MEDICATION *UNUSUAL SHORTNESS OF BREATH *UNUSUAL BRUISING OR BLEEDING *URINARY PROBLEMS (pain or burning when urinating, or frequent urination) *BOWEL PROBLEMS (unusual diarrhea, constipation, pain near the anus) TENDERNESS IN MOUTH AND THROAT WITH OR WITHOUT PRESENCE OF ULCERS (sore throat, sores in mouth, or a toothache) UNUSUAL RASH, SWELLING OR PAIN  UNUSUAL VAGINAL DISCHARGE OR ITCHING   Items with * indicate a potential emergency and should be followed up as soon as possible or go to the Emergency Department if any problems should occur.  Please show the CHEMOTHERAPY ALERT CARD or IMMUNOTHERAPY ALERT CARD at  check-in to the Emergency Department and triage nurse.  Should you have questions after your visit or need to cancel or reschedule your appointment, please contact [facility]  Dept: [phone]  and follow the prompts.  Office hours are 8:00 a.m. to 4:30 p.m. Monday - Friday. Please note that voicemails left after 4:00 p.m. may not be returned until the following business day.  We are closed weekends and major holidays. You have access to a nurse at all times for urgent questions. Please call the main number to the clinic Dept: [phone] and follow the prompts.   For any non-urgent questions, you may also contact your provider using MyChart. We now offer e-Visits for anyone 64 and older to request care online for non-urgent symptoms. For details visit [email or web address].   Also download the MyChart app! Go to the app store, search "MyChart", open the app, select [facility], and log in with your MyChart username and password.  Due to Covid, a mask is required upon entering the hospital/clinic. If you do not have a mask, one will be given to you upon arrival. For doctor visits, patients may have 1 support person aged 37 or older with them. For treatment visits, patients cannot have anyone with them due to current Covid guidelines and our immunocompromised population.   Doxorubicin injection What is this medication? DOXORUBICIN (dox oh ROO bi sin) is a chemotherapy drug. It is used to treat many kinds of cancer like leukemia, lymphoma, neuroblastoma, sarcoma, and Wilms' tumor. It is also used to treat bladder cancer, breast cancer, lung cancer, ovarian cancer,  stomach cancer, and thyroid cancer. This medicine may be used for other purposes; ask your health care provider or pharmacist if you have questions. COMMON BRAND NAME(S): Adriamycin, Adriamycin PFS, Adriamycin RDF, Rubex What should I tell my care team before I take this medication? They need to know if  you have any of these conditions: heart disease history of low blood counts caused by a medicine liver disease recent or ongoing radiation therapy an unusual or allergic reaction to doxorubicin, other chemotherapy agents, other medicines, foods, dyes, or preservatives pregnant or trying to get pregnant breast-feeding How should I use this medication? This drug is given as an infusion into a vein. It is administered in a hospital or clinic by a specially trained health care professional. If you have pain, swelling, burning or any unusual feeling around the site of your injection, tell your health care professional right away. Talk to your pediatrician regarding the use of this medicine in children. Special care may be needed. Overdosage: If you think you have taken too much of this medicine contact a poison control center or emergency room at once. NOTE: This medicine is only for you. Do not share this medicine with others. What if I miss a dose? It is important not to miss your dose. Call your doctor or health care professional if you are unable to keep an appointment. What may interact with this medication? This medicine may interact with the following medications: 6-mercaptopurine paclitaxel phenytoin St. John's Wort trastuzumab verapamil This list may not describe all possible interactions. Give your health care provider a list of all the medicines, herbs, non-prescription drugs, or dietary supplements you use. Also tell them if you smoke, drink alcohol, or use illegal drugs. Some items may interact with your medicine. What should I watch for while using this medication? This drug may make you feel generally unwell. This is not uncommon, as chemotherapy can affect healthy cells as well as cancer cells. Report any side effects. Continue your course of treatment even though you feel ill unless your doctor tells you to stop. There is a maximum amount of this medicine you should receive  throughout your life. The amount depends on the medical condition being treated and your overall health. Your doctor will watch how much of this medicine you receive in your lifetime. Tell your doctor if you have taken this medicine before. You may need blood work done while you are taking this medicine. Your urine may turn red for a few days after your dose. This is not blood. If your urine is dark or brown, call your doctor. In some cases, you may be given additional medicines to help with side effects. Follow all directions for their use. Call your doctor or health care professional for advice if you get a fever, chills or sore throat, or other symptoms of a cold or flu. Do not treat yourself. This drug decreases your body's ability to fight infections. Try to avoid being around people who are sick. This medicine may increase your risk to bruise or bleed. Call your doctor or health care professional if you notice any unusual bleeding. Talk to your doctor about your risk of cancer. You may be more at risk for certain types of cancers if you take this medicine. Do not become pregnant while taking this medicine or for 6 months after stopping it. Women should inform their doctor if they wish to become pregnant or think they might be pregnant. Men should not father a child while  taking this medicine and for 6 months after stopping it. There is a potential for serious side effects to an unborn child. Talk to your health care professional or pharmacist for more information. Do not breast-feed an infant while taking this medicine. This medicine has caused ovarian failure in some women and reduced sperm counts in some men This medicine may interfere with the ability to have a child. Talk with your doctor or health care professional if you are concerned about your fertility. This medicine may cause a decrease in Co-Enzyme Q-10. You should make sure that you get enough Co-Enzyme Q-10 while you are taking this  medicine. Discuss the foods you eat and the vitamins you take with your health care professional. What side effects may I notice from receiving this medication? Side effects that you should report to your doctor or health care professional as soon as possible: allergic reactions like skin rash, itching or hives, swelling of the face, lips, or tongue breathing problems chest pain fast or irregular heartbeat low blood counts - this medicine may decrease the number of white blood cells, red blood cells and platelets. You may be at increased risk for infections and bleeding. pain, redness, or irritation at site where injected signs of infection - fever or chills, cough, sore throat, pain or difficulty passing urine signs of decreased platelets or bleeding - bruising, pinpoint red spots on the skin, black, tarry stools, blood in the urine swelling of the ankles, feet, hands tiredness weakness Side effects that usually do not require medical attention (report to your doctor or health care professional if they continue or are bothersome): diarrhea hair loss mouth sores nail discoloration or damage nausea red colored urine vomiting This list may not describe all possible side effects. Call your doctor for medical advice about side effects. You may report side effects to FDA at 1-800-FDA-1088. Where should I keep my medication? This drug is given in a hospital or clinic and will not be stored at home. NOTE: This sheet is a summary. It may not cover all possible information. If you have questions about this medicine, talk to your doctor, pharmacist, or health care provider.  2022 Elsevier/Gold Standard (2016-09-12 00:00:00)  Cyclophosphamide Injection What is this medication? CYCLOPHOSPHAMIDE (sye kloe FOSS fa mide) is a chemotherapy drug. It slows the growth of cancer cells. This medicine is used to treat many types of cancer like lymphoma, myeloma, leukemia, breast cancer, and ovarian cancer,  to name a few. This medicine may be used for other purposes; ask your health care provider or pharmacist if you have questions. COMMON BRAND NAME(S): Cytoxan, Neosar What should I tell my care team before I take this medication? They need to know if you have any of these conditions: heart disease history of irregular heartbeat infection kidney disease liver disease low blood counts, like white cells, platelets, or red blood cells on hemodialysis recent or ongoing radiation therapy scarring or thickening of the lungs trouble passing urine an unusual or allergic reaction to cyclophosphamide, other medicines, foods, dyes, or preservatives pregnant or trying to get pregnant breast-feeding How should I use this medication? This drug is usually given as an injection into a vein or muscle or by infusion into a vein. It is administered in a hospital or clinic by a specially trained health care professional. Talk to your pediatrician regarding the use of this medicine in children. Special care may be needed. Overdosage: If you think you have taken too much of this medicine contact  a poison control center or emergency room at once. NOTE: This medicine is only for you. Do not share this medicine with others. What if I miss a dose? It is important not to miss your dose. Call your doctor or health care professional if you are unable to keep an appointment. What may interact with this medication? amphotericin B azathioprine certain antivirals for HIV or hepatitis certain medicines for blood pressure, heart disease, irregular heart beat certain medicines that treat or prevent blood clots like warfarin certain other medicines for cancer cyclosporine etanercept indomethacin medicines that relax muscles for surgery medicines to increase blood counts metronidazole This list may not describe all possible interactions. Give your health care provider a list of all the medicines, herbs,  non-prescription drugs, or dietary supplements you use. Also tell them if you smoke, drink alcohol, or use illegal drugs. Some items may interact with your medicine. What should I watch for while using this medication? Your condition will be monitored carefully while you are receiving this medicine. You may need blood work done while you are taking this medicine. Drink water or other fluids as directed. Urinate often, even at night. Some products may contain alcohol. Ask your health care professional if this medicine contains alcohol. Be sure to tell all health care professionals you are taking this medicine. Certain medicines, like metronidazole and disulfiram, can cause an unpleasant reaction when taken with alcohol. The reaction includes flushing, headache, nausea, vomiting, sweating, and increased thirst. The reaction can last from 30 minutes to several hours. Do not become pregnant while taking this medicine or for 1 year after stopping it. Women should inform their health care professional if they wish to become pregnant or think they might be pregnant. Men should not father a child while taking this medicine and for 4 months after stopping it. There is potential for serious side effects to an unborn child. Talk to your health care professional for more information. Do not breast-feed an infant while taking this medicine or for 1 week after stopping it. This medicine has caused ovarian failure in some women. This medicine may make it more difficult to get pregnant. Talk to your health care professional if you are concerned about your fertility. This medicine has caused decreased sperm counts in some men. This may make it more difficult to father a child. Talk to your health care professional if you are concerned about your fertility. Call your health care professional for advice if you get a fever, chills, or sore throat, or other symptoms of a cold or flu. Do not treat yourself. This medicine  decreases your body's ability to fight infections. Try to avoid being around people who are sick. Avoid taking medicines that contain aspirin, acetaminophen, ibuprofen, naproxen, or ketoprofen unless instructed by your health care professional. These medicines may hide a fever. Talk to your health care professional about your risk of cancer. You may be more at risk for certain types of cancer if you take this medicine. If you are going to need surgery or other procedure, tell your health care professional that you are using this medicine. Be careful brushing or flossing your teeth or using a toothpick because you may get an infection or bleed more easily. If you have any dental work done, tell your dentist you are receiving this medicine. What side effects may I notice from receiving this medication? Side effects that you should report to your doctor or health care professional as soon as possible: allergic reactions like  skin rash, itching or hives, swelling of the face, lips, or tongue breathing problems nausea, vomiting signs and symptoms of bleeding such as bloody or black, tarry stools; red or dark brown urine; spitting up blood or brown material that looks like coffee grounds; red spots on the skin; unusual bruising or bleeding from the eyes, gums, or nose signs and symptoms of heart failure like fast, irregular heartbeat, sudden weight gain; swelling of the ankles, feet, hands signs and symptoms of infection like fever; chills; cough; sore throat; pain or trouble passing urine signs and symptoms of kidney injury like trouble passing urine or change in the amount of urine signs and symptoms of liver injury like dark yellow or brown urine; general ill feeling or flu-like symptoms; light-colored stools; loss of appetite; nausea; right upper belly pain; unusually weak or tired; yellowing of the eyes or skin Side effects that usually do not require medical attention (report to your doctor or health  care professional if they continue or are bothersome): confusion decreased hearing diarrhea facial flushing hair loss headache loss of appetite missed menstrual periods signs and symptoms of low red blood cells or anemia such as unusually weak or tired; feeling faint or lightheaded; falls skin discoloration This list may not describe all possible side effects. Call your doctor for medical advice about side effects. You may report side effects to FDA at 1-800-FDA-1088. Where should I keep my medication? This drug is given in a hospital or clinic and will not be stored at home. NOTE: This sheet is a summary. It may not cover all possible information. If you have questions about this medicine, talk to your doctor, pharmacist, or health care provider.  2022 Elsevier/Gold Standard (2020-09-26 00:00:00)

## 2021-03-14 NOTE — Progress Notes (Signed)
Left message stating courtesy call and if any questions or concerns please call the doctors office.  

## 2021-03-16 ENCOUNTER — Other Ambulatory Visit: Payer: Self-pay

## 2021-03-16 ENCOUNTER — Inpatient Hospital Stay: Payer: BC Managed Care – PPO

## 2021-03-16 ENCOUNTER — Ambulatory Visit: Payer: BC Managed Care – PPO

## 2021-03-16 VITALS — BP 154/81 | HR 68 | Temp 98.4°F | Resp 18

## 2021-03-16 DIAGNOSIS — C50412 Malignant neoplasm of upper-outer quadrant of left female breast: Secondary | ICD-10-CM | POA: Diagnosis not present

## 2021-03-16 DIAGNOSIS — Z5111 Encounter for antineoplastic chemotherapy: Secondary | ICD-10-CM | POA: Diagnosis not present

## 2021-03-16 DIAGNOSIS — Z170 Estrogen receptor positive status [ER+]: Secondary | ICD-10-CM | POA: Diagnosis not present

## 2021-03-16 DIAGNOSIS — Z5189 Encounter for other specified aftercare: Secondary | ICD-10-CM | POA: Diagnosis not present

## 2021-03-16 MED ORDER — PEGFILGRASTIM-CBQV 6 MG/0.6ML ~~LOC~~ SOSY
6.0000 mg | PREFILLED_SYRINGE | Freq: Once | SUBCUTANEOUS | Status: AC
  Administered 2021-03-16: 6 mg via SUBCUTANEOUS
  Filled 2021-03-16: qty 0.6

## 2021-03-16 NOTE — Patient Instructions (Signed)

## 2021-03-19 ENCOUNTER — Ambulatory Visit: Payer: BC Managed Care – PPO | Admitting: Hematology and Oncology

## 2021-03-19 ENCOUNTER — Other Ambulatory Visit: Payer: BC Managed Care – PPO

## 2021-03-20 ENCOUNTER — Other Ambulatory Visit: Payer: BC Managed Care – PPO

## 2021-03-20 ENCOUNTER — Ambulatory Visit: Payer: BC Managed Care – PPO | Admitting: Hematology and Oncology

## 2021-03-20 NOTE — Progress Notes (Signed)
? ?Patient Care Team: ?[name], PA as PCP - General (Family Medicine) ?[name], MD as Consulting Physician (General Surgery) ?[name], MD as Consulting Physician (Hematology and Oncology) ?[name], MD as Consulting Physician (Radiation Oncology) ?Rockwell Germany, RN as Oncology Nurse Navigator ?[name], RN as Oncology Nurse Navigator ? ?DIAGNOSIS:  ?  ICD-10-CM   ?1. Ductal carcinoma in situ (DCIS) of right breast  D05.11   ?  ? ? ?SUMMARY OF ONCOLOGIC HISTORY: ?Oncology History  ?Ductal carcinoma in situ (DCIS) of right breast  ?01/30/2018 Mammogram  ? Diagnostic Mammogram  ?0.4cm cluster of calcifications, 12 o'clock position, 4cm fn, right breast ?  ?02/12/2018 Initial Diagnosis  ? Screening detected right breast calcifications 4 mm size UOQ 11:30 position stereotactic biopsy revealed high-grade DCIS ER 90%, PR 10%, Tis NX stage 0 ?  ?02/18/2018 Genetic Testing  ? Negative.  Genes tested include: ATM, BRCA1, BRCA2, CDH1, CHEK2, PALB2, PTEN, STK11 and TP53; APC, ATM, AXIN2, BARD1, BMPR1A, BRCA1, BRCA2, BRIP1, CDH1, CDKN2A (p14ARF), CDKN2A (p16INK4a), CKD4, CHEK2, CTNNA1, DICER1, EPCAM (Deletion/duplication testing only), GREM1 (promoter region deletion/duplication testing only), KIT, MEN1, MLH1, MSH2, MSH3, MSH6, MUTYH, NBN, NF1, NHTL1, PALB2, PDGFRA, PMS2, POLD1, POLE, PTEN, RAD50, RAD51C, RAD51D, SDHB, SDHC, SDHD, SMAD4, SMARCA4. STK11, TP53, TSC1, TSC2, and VHL.  The following genes were evaluated for sequence changes only: SDHA and HOXB13 c.251G>[id number] variant only. ?  ?03/20/2018 Surgery  ? Right lumpectomy: Scattered microscopic foci of DCIS intermediate grade, margins negative, ER 90%, PR 10%, Tis NX stage 0 ?  ?04/01/2018 Cancer Staging  ? Staging form: Breast, AJCC 8th Edition ?- Pathologic: Stage 0 (pTis (DCIS), pN0, cM0, ER+, PR+) - Signed by Gardenia Phlegm, NP on 04/01/2018 ? ?  ?04/28/2018 - 05/26/2018 Radiation Therapy  ? Adjuvant radiation 1. Right breast; 15  fractions of 2.67 Gy for a total of 40.05 Gy 2. Boost; 5 fractions of 2 Gy for a total of 10 Gy ?  ?  ?05/2018 -  Anti-estrogen oral therapy  ? Tamoxifen daily ?  ?Malignant neoplasm of upper-outer quadrant of left breast in female, estrogen receptor positive ([place])  ?01/19/2021 Initial Diagnosis  ? Left breast biopsy: 2:00 and 4:00: Grade 3 IDC ER 40% weak, PR 0%, Ki-67 40%, HER2 2+ equivocal by IHC, FISH negative ?Lymph node biopsy: Benign ?  ?02/12/2021 Breast MRI  ? Breast MRI 02/12/2021: 4 suspicious masses within the left breast consistent with multifocal, multicentric disease 2 of the 4 masses have been biopsied (2 cm, 1.3 cm, 1.2 cm, 1.7 cm) for mildly prominent level 1 lymph nodes are identified ?  ?03/14/2021 -  Chemotherapy  ? Patient is on Treatment Plan : BREAST ADJUVANT DOSE DENSE AC q14d / PACLitaxel q7d  ?   ? ? ?CHIEF COMPLIANT: Cycle 1 day 8 Adriamycin and Cytoxan ? ?INTERVAL HISTORY: Jasmine Maddox is a 61 y.o. with above-mentioned history of left breast cancer who is currently on chemotherapy with Adriamycin and Cytoxan.  Today is cycle 1 day 8. She presents to the clinic today for toxicity check and follow-up.  She complains of severe anxiety related to coming into the cancer center.  After chemotherapy she felt nauseated for couple of days but did not throw up.  She took the nausea medications.  She also feels extremely fatigued all of the symptoms lasted until last Friday.  Today she is starting to feel better and even went to work.  She works in the school system. ? ?ALLERGIES:  has  No Known Allergies. ? ?MEDICATIONS:  ?Current Outpatient Medications  ?Medication Sig Dispense Refill  ? dexamethasone (DECADRON) 4 MG tablet Take 1 tablet (4 mg total) by mouth daily. Take 1 tablet day after chemo and 1 tablet 2 days after chemo with food 8 tablet 0  ? lidocaine-prilocaine (EMLA) cream Apply to affected area once 30 g 3  ? lisinopril (PRINIVIL,ZESTRIL) 20 MG tablet Take 20 mg by mouth daily.    ?  ondansetron (ZOFRAN) 8 MG tablet Take 1 tablet (8 mg total) by mouth 2 (two) times daily as needed. Start on the third day after chemotherapy. 30 tablet 1  ? prochlorperazine (COMPAZINE) 10 MG tablet Take 1 tablet (10 mg total) by mouth every 6 (six) hours as needed (Nausea or vomiting). 30 tablet 1  ? tamoxifen (NOLVADEX) 20 MG tablet Take 20 mg by mouth daily.    ? traMADol (ULTRAM) 50 MG tablet Take 2 tablets (100 mg total) by mouth every 6 (six) hours as needed. 6 tablet 0  ? ?No current facility-administered medications for this visit.  ? ? ?PHYSICAL EXAMINATION: ?ECOG PERFORMANCE STATUS: 1 - Symptomatic but completely ambulatory ? ?Vitals:  ? 03/21/21 1506  ?BP: 140/60  ?Pulse: 74  ?Resp: 17  ?Temp: (!) 97.3 ?F (36.3 ?C)  ?SpO2: 99%  ? ?Filed Weights  ? 03/21/21 1506  ?Weight: 145 lb 9.6 oz (66 kg)  ? ?  ? ?LABORATORY DATA:  ?I have reviewed the data as listed ?CMP Latest Ref Rng & Units 03/14/2021 02/18/2018  ?Glucose 70 - 99 mg/dL 117(H) 94  ?BUN 6 - 20 mg/dL 11 10  ?Creatinine 0.44 - 1.00 mg/dL 0.75 0.77  ?Sodium 135 - 145 mmol/L 140 140  ?Potassium 3.5 - 5.1 mmol/L 3.6 4.0  ?Chloride 98 - 111 mmol/L 106 106  ?CO2 22 - 32 mmol/L 29 27  ?Calcium 8.9 - 10.3 mg/dL 8.8(L) 9.5  ?Total Protein 6.5 - 8.1 g/dL 6.9 7.7  ?Total Bilirubin 0.3 - 1.2 mg/dL 0.4 0.4  ?Alkaline Phos 38 - 126 U/L 54 64  ?AST 15 - 41 U/L 30 28  ?ALT 0 - 44 U/L 47(H) 32  ? ? ?Lab Results  ?Component Value Date  ? WBC 0.9 (LL) 03/21/2021  ? HGB 10.6 (L) 03/21/2021  ? HCT 31.3 (L) 03/21/2021  ? MCV 88.7 03/21/2021  ? PLT 81 (L) 03/21/2021  ? NEUTROABS PENDING 03/21/2021  ? ? ?ASSESSMENT & PLAN:  ?Ductal carcinoma in situ (DCIS) of right breast ?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, PR 0%, Ki-67 40%, HER2 2+ equivocal by IHC, FISH negative Lymph node biopsy: Benign ?  ?Breast MRI 02/12/2021: 4 suspicious masses within the left breast consistent with multifocal, multicentric disease 2 of the 4 masses have been  biopsied (2 cm, 1.3 cm, 1.2 cm, 1.7 cm) for mildly prominent level 1 lymph nodes are identified ?  ?Treatment plan: ?1.  Neoadjuvant chemotherapy with dose dense Adriamycin Cytoxan followed by Taxol   ?2. mastectomy with sentinel lymph node biopsy ?3.  Adjuvant radiation ?4.  Followed by adjuvant antiestrogen therapy ?------------------------------------------------------------------------------------------ ?Current Treatment: Cycle 1 day 8 DD Adriamycin and Cytoxan ?Chemo Toxicities: ?Severe neutropenia at nadir count: ANC 0.1: We will reduce the dosage of cycle 2. ?Severe anxiety: It appears to have gotten worse.  I offered her medication help but she wants to do it by herself.  We discussed the role of Insight timer app based guided imagery. ?Mild nausea: Because she was not  eating much food. ? ?RTC in 1 week for cycle 2 ? ? ? ?No orders of the defined types were placed in this encounter. ? ?The patient has a good understanding of the overall plan. she agrees with it. she will call with any problems that may develop before the next visit here. ? ?Total time spent: 30 mins including face to face time and time spent for planning, charting and coordination of care ? ?Rulon Eisenmenger, MD, MPH ?03/21/2021 ? ?I, Thana Ates, am acting as scribe for Dr. [name]. ? ?I have reviewed the above documentation for accuracy and completeness, and I agree with the above. ? ? ? ? ? ? ?

## 2021-03-21 ENCOUNTER — Inpatient Hospital Stay: Payer: BC Managed Care – PPO | Attending: Hematology and Oncology

## 2021-03-21 ENCOUNTER — Other Ambulatory Visit: Payer: BC Managed Care – PPO

## 2021-03-21 ENCOUNTER — Ambulatory Visit: Payer: BC Managed Care – PPO | Admitting: Hematology and Oncology

## 2021-03-21 ENCOUNTER — Other Ambulatory Visit: Payer: Self-pay

## 2021-03-21 ENCOUNTER — Inpatient Hospital Stay (HOSPITAL_BASED_OUTPATIENT_CLINIC_OR_DEPARTMENT_OTHER): Payer: BC Managed Care – PPO | Admitting: Hematology and Oncology

## 2021-03-21 ENCOUNTER — Encounter: Payer: Self-pay | Admitting: *Deleted

## 2021-03-21 DIAGNOSIS — C50412 Malignant neoplasm of upper-outer quadrant of left female breast: Secondary | ICD-10-CM | POA: Insufficient documentation

## 2021-03-21 DIAGNOSIS — Z5189 Encounter for other specified aftercare: Secondary | ICD-10-CM | POA: Insufficient documentation

## 2021-03-21 DIAGNOSIS — M549 Dorsalgia, unspecified: Secondary | ICD-10-CM | POA: Insufficient documentation

## 2021-03-21 DIAGNOSIS — Z170 Estrogen receptor positive status [ER+]: Secondary | ICD-10-CM | POA: Diagnosis not present

## 2021-03-21 DIAGNOSIS — Z95828 Presence of other vascular implants and grafts: Secondary | ICD-10-CM

## 2021-03-21 DIAGNOSIS — D0511 Intraductal carcinoma in situ of right breast: Secondary | ICD-10-CM | POA: Insufficient documentation

## 2021-03-21 DIAGNOSIS — Z5111 Encounter for antineoplastic chemotherapy: Secondary | ICD-10-CM | POA: Insufficient documentation

## 2021-03-21 LAB — CBC WITH DIFFERENTIAL (CANCER CENTER ONLY)
Abs Immature Granulocytes: 0 10*3/uL (ref 0.00–0.07)
Basophils Absolute: 0 10*3/uL (ref 0.0–0.1)
Basophils Relative: 2 %
Eosinophils Absolute: 0 10*3/uL (ref 0.0–0.5)
Eosinophils Relative: 1 %
HCT: 31.3 % — ABNORMAL LOW (ref 36.0–46.0)
Hemoglobin: 10.6 g/dL — ABNORMAL LOW (ref 12.0–15.0)
Immature Granulocytes: 0 %
Lymphocytes Relative: 81 %
Lymphs Abs: 0.7 10*3/uL (ref 0.7–4.0)
MCH: 30 pg (ref 26.0–34.0)
MCHC: 33.9 g/dL (ref 30.0–36.0)
MCV: 88.7 fL (ref 80.0–100.0)
Monocytes Absolute: 0.1 10*3/uL (ref 0.1–1.0)
Monocytes Relative: 6 %
Neutro Abs: 0.1 10*3/uL — CL (ref 1.7–7.7)
Neutrophils Relative %: 10 %
Platelet Count: 81 10*3/uL — ABNORMAL LOW (ref 150–400)
RBC: 3.53 MIL/uL — ABNORMAL LOW (ref 3.87–5.11)
RDW: 11.6 % (ref 11.5–15.5)
Smear Review: NORMAL
WBC Count: 0.9 10*3/uL — CL (ref 4.0–10.5)
nRBC: 0 % (ref 0.0–0.2)

## 2021-03-21 LAB — CMP (CANCER CENTER ONLY)
ALT: 72 U/L — ABNORMAL HIGH (ref 0–44)
AST: 27 U/L (ref 15–41)
Albumin: 3.6 g/dL (ref 3.5–5.0)
Alkaline Phosphatase: 63 U/L (ref 38–126)
Anion gap: 5 (ref 5–15)
BUN: 15 mg/dL (ref 6–20)
CO2: 29 mmol/L (ref 22–32)
Calcium: 8.4 mg/dL — ABNORMAL LOW (ref 8.9–10.3)
Chloride: 104 mmol/L (ref 98–111)
Creatinine: 0.67 mg/dL (ref 0.44–1.00)
GFR, Estimated: >60 mL/min (ref >60)
Glucose, Bld: 117 mg/dL — ABNORMAL HIGH (ref 70–99)
Potassium: 3.6 mmol/L (ref 3.5–5.1)
Sodium: 138 mmol/L (ref 135–145)
Total Bilirubin: 0.2 mg/dL — ABNORMAL LOW (ref 0.3–1.2)
Total Protein: 6.7 g/dL (ref 6.5–8.1)

## 2021-03-21 MED ORDER — HEPARIN SOD (PORK) LOCK FLUSH 100 UNIT/ML IV SOLN
500.0000 [IU] | Freq: Once | INTRAVENOUS | Status: AC
  Administered 2021-03-21: 500 [IU] via INTRAVENOUS

## 2021-03-21 MED ORDER — SODIUM CHLORIDE 0.9% FLUSH
10.0000 mL | Freq: Once | INTRAVENOUS | Status: AC
  Administered 2021-03-21: 10 mL

## 2021-03-21 NOTE — Assessment & Plan Note (Signed)
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, PR 0%, Ki-67 40%, HER2 2+ equivocal by IHC, FISH negative Lymph node biopsy: Benign ?? ?Breast MRI 02/12/2021: 4 suspicious masses within the left breast consistent with multifocal, multicentric disease 2 of the 4 masses have been biopsied (2 cm, 1.3 cm, 1.2 cm, 1.7 cm) for mildly prominent level 1 lymph nodes are identified ?? ?Treatment plan: ?1.??Neoadjuvant chemotherapy with dose dense Adriamycin Cytoxan followed by Taxol ? ?2.?mastectomy?with sentinel lymph node biopsy ?3.??Adjuvant radiation ?4.??Followed by adjuvant antiestrogen therapy ?------------------------------------------------------------------------------------------ ?Current Treatment: Cycle 1 day 8 DD Adriamycin and Cytoxan ?Chemo Toxicities: ? ?RTC in 1 week for cycle 2 ?

## 2021-03-21 NOTE — Progress Notes (Signed)
CRITICAL VALUE STICKER ? ?CRITICAL VALUE: ANC 0.1 and WBC 0.9 ? ?RECEIVER (on-site recipient of call): Merleen Nicely, RN ? ?DATE & TIME NOTIFIED: 03/21/21 at 1509 ? ? ?MD NOTIFIED: [name], MD ? ?TIME OF NOTIFICATION:03/21/21 at 1515 ? ?RESPONSE:  MD notified, verbalized understanding.  No orders received at this time.  ?

## 2021-03-23 ENCOUNTER — Other Ambulatory Visit: Payer: Self-pay | Admitting: *Deleted

## 2021-03-23 ENCOUNTER — Telehealth: Payer: Self-pay

## 2021-03-23 ENCOUNTER — Encounter: Payer: Self-pay | Admitting: Adult Health

## 2021-03-23 ENCOUNTER — Other Ambulatory Visit: Payer: Self-pay

## 2021-03-23 ENCOUNTER — Inpatient Hospital Stay (HOSPITAL_BASED_OUTPATIENT_CLINIC_OR_DEPARTMENT_OTHER): Payer: BC Managed Care – PPO | Admitting: Adult Health

## 2021-03-23 ENCOUNTER — Inpatient Hospital Stay: Payer: BC Managed Care – PPO

## 2021-03-23 VITALS — BP 121/66 | HR 79 | Temp 97.5°F | Resp 18 | Ht 63.0 in | Wt 144.0 lb

## 2021-03-23 DIAGNOSIS — Z170 Estrogen receptor positive status [ER+]: Secondary | ICD-10-CM

## 2021-03-23 DIAGNOSIS — C50412 Malignant neoplasm of upper-outer quadrant of left female breast: Secondary | ICD-10-CM

## 2021-03-23 DIAGNOSIS — M549 Dorsalgia, unspecified: Secondary | ICD-10-CM | POA: Diagnosis not present

## 2021-03-23 DIAGNOSIS — D0511 Intraductal carcinoma in situ of right breast: Secondary | ICD-10-CM | POA: Diagnosis not present

## 2021-03-23 DIAGNOSIS — Z5111 Encounter for antineoplastic chemotherapy: Secondary | ICD-10-CM | POA: Diagnosis not present

## 2021-03-23 DIAGNOSIS — Z5189 Encounter for other specified aftercare: Secondary | ICD-10-CM | POA: Diagnosis not present

## 2021-03-23 LAB — CBC WITH DIFFERENTIAL (CANCER CENTER ONLY)
Abs Immature Granulocytes: 0.23 10*3/uL — ABNORMAL HIGH (ref 0.00–0.07)
Basophils Absolute: 0.1 10*3/uL (ref 0.0–0.1)
Basophils Relative: 1 %
Eosinophils Absolute: 0 10*3/uL (ref 0.0–0.5)
Eosinophils Relative: 0 %
HCT: 31.6 % — ABNORMAL LOW (ref 36.0–46.0)
Hemoglobin: 10.8 g/dL — ABNORMAL LOW (ref 12.0–15.0)
Immature Granulocytes: 6 %
Lymphocytes Relative: 27 %
Lymphs Abs: 1.1 10*3/uL (ref 0.7–4.0)
MCH: 30.4 pg (ref 26.0–34.0)
MCHC: 34.2 g/dL (ref 30.0–36.0)
MCV: 89 fL (ref 80.0–100.0)
Monocytes Absolute: 0.4 10*3/uL (ref 0.1–1.0)
Monocytes Relative: 9 %
Neutro Abs: 2.3 10*3/uL (ref 1.7–7.7)
Neutrophils Relative %: 57 %
Platelet Count: 72 10*3/uL — ABNORMAL LOW (ref 150–400)
RBC: 3.55 MIL/uL — ABNORMAL LOW (ref 3.87–5.11)
RDW: 11.5 % (ref 11.5–15.5)
Smear Review: NORMAL
WBC Count: 4 10*3/uL (ref 4.0–10.5)
nRBC: 0 % (ref 0.0–0.2)

## 2021-03-23 LAB — URINALYSIS, COMPLETE (UACMP) WITH MICROSCOPIC
Bilirubin Urine: NEGATIVE
Glucose, UA: NEGATIVE mg/dL
Ketones, ur: NEGATIVE mg/dL
Leukocytes,Ua: NEGATIVE
Nitrite: NEGATIVE
Protein, ur: NEGATIVE mg/dL
Specific Gravity, Urine: 1.019 (ref 1.005–1.030)
pH: 6 (ref 5.0–8.0)

## 2021-03-23 LAB — CMP (CANCER CENTER ONLY)
ALT: 57 U/L — ABNORMAL HIGH (ref 0–44)
AST: 22 U/L (ref 15–41)
Albumin: 3.5 g/dL (ref 3.5–5.0)
Alkaline Phosphatase: 66 U/L (ref 38–126)
Anion gap: 4 — ABNORMAL LOW (ref 5–15)
BUN: 8 mg/dL (ref 6–20)
CO2: 29 mmol/L (ref 22–32)
Calcium: 8.7 mg/dL — ABNORMAL LOW (ref 8.9–10.3)
Chloride: 105 mmol/L (ref 98–111)
Creatinine: 0.68 mg/dL (ref 0.44–1.00)
GFR, Estimated: >60 mL/min (ref >60)
Glucose, Bld: 114 mg/dL — ABNORMAL HIGH (ref 70–99)
Potassium: 3.8 mmol/L (ref 3.5–5.1)
Sodium: 138 mmol/L (ref 135–145)
Total Bilirubin: 0.4 mg/dL (ref 0.3–1.2)
Total Protein: 6.4 g/dL — ABNORMAL LOW (ref 6.5–8.1)

## 2021-03-23 MED ORDER — HYDROCODONE-ACETAMINOPHEN 5-325 MG PO TABS: 1.0000 | ORAL_TABLET | Freq: Three times a day (TID) | ORAL | 0 refills | Status: DC | PRN

## 2021-03-23 NOTE — Telephone Encounter (Signed)
Pt called and reports having constant back spasms; sx onset was 03/22/21 and spasms are constant no matter what the position. Pt was offered lab appt for 1000 and NP visit for 1030 and accepted.  ?

## 2021-03-23 NOTE — Progress Notes (Signed)
[facility] Follow up:    Jasmine Maddox, [place] [address]   DIAGNOSIS:  Cancer Staging  Ductal carcinoma in situ (DCIS) of right breast Staging form: Breast, AJCC 8th Edition - Clinical stage from 02/18/2018: Stage 0 (cTis (DCIS), cN0, cM0, ER+, PR+) - Unsigned Nuclear grade: G3 Laterality: Right - Pathologic: Stage 0 (pTis (DCIS), pN0, cM0, ER+, PR+) - Signed by Gardenia Phlegm, NP on 04/01/2018   SUMMARY OF ONCOLOGIC HISTORY: Oncology History  Ductal carcinoma in situ (DCIS) of right breast  01/30/2018 Mammogram   Diagnostic Mammogram  0.4cm cluster of calcifications, 12 o'clock position, 4cm fn, right breast   02/12/2018 Initial Diagnosis   Screening detected right breast calcifications 4 mm size UOQ 11:30 position stereotactic biopsy revealed high-grade DCIS ER 90%, PR 10%, Tis NX stage 0   02/18/2018 Genetic Testing   Negative.  Genes tested include: ATM, BRCA1, BRCA2, CDH1, CHEK2, PALB2, PTEN, STK11 and TP53; APC, ATM, AXIN2, BARD1, BMPR1A, BRCA1, BRCA2, BRIP1, CDH1, CDKN2A (p14ARF), CDKN2A (p16INK4a), CKD4, CHEK2, CTNNA1, DICER1, EPCAM (Deletion/duplication testing only), GREM1 (promoter region deletion/duplication testing only), KIT, MEN1, MLH1, MSH2, MSH3, MSH6, MUTYH, NBN, NF1, NHTL1, PALB2, PDGFRA, PMS2, POLD1, POLE, PTEN, RAD50, RAD51C, RAD51D, SDHB, SDHC, SDHD, SMAD4, SMARCA4. STK11, TP53, TSC1, TSC2, and VHL.  The following genes were evaluated for sequence changes only: SDHA and HOXB13 c.251G>A variant only.   03/20/2018 Surgery   Right lumpectomy: Scattered microscopic foci of DCIS intermediate grade, margins negative, ER 90%, PR 10%, Tis NX stage 0   04/01/2018 Cancer Staging   Staging form: Breast, AJCC 8th Edition - Pathologic: Stage 0 (pTis (DCIS), pN0, cM0, ER+, PR+) - Signed by Gardenia Phlegm, NP on 04/01/2018    04/28/2018 - 05/26/2018 Radiation Therapy   Adjuvant radiation 1. Right breast; 15  fractions of 2.67 Gy for a total of 40.05 Gy 2. Boost; 5 fractions of 2 Gy for a total of 10 Gy     05/2018 -  Anti-estrogen oral therapy   Tamoxifen daily   Malignant neoplasm of upper-outer quadrant of left breast in female, estrogen receptor positive ([place])  01/19/2021 Initial Diagnosis   Left breast biopsy: 2:00 and 4:00: Grade 3 IDC ER 40% weak, PR 0%, Ki-67 40%, HER2 2+ equivocal by IHC, FISH negative Lymph node biopsy: Benign   02/12/2021 Breast MRI   Breast MRI 02/12/2021: 4 suspicious masses within the left breast consistent with multifocal, multicentric disease 2 of the 4 masses have been biopsied (2 cm, 1.3 cm, 1.2 cm, 1.7 cm) for mildly prominent level 1 lymph nodes are identified   03/14/2021 -  Chemotherapy   Patient is on Treatment Plan : BREAST ADJUVANT DOSE DENSE AC q14d / PACLitaxel q7d       CURRENT THERAPY: Adriamycin and Cytoxan cycle 1 day 10.  INTERVAL HISTORY: Jasmine Maddox 61 y.o. female returns for evaluation of lower back pain that began yesterday at 4 pm.  She says it is 10/10 and it is constant.  She took extra strength tylenol yesterday which didn't help.  She had tramadol this morning which didn't help either.  She says that she really prefers not to take any stronger narcotics because she does not like the way they make her feel.    Patient Active Problem List   Diagnosis Date Noted   Malignant neoplasm of upper-outer quadrant of left breast in female, estrogen receptor positive ([place]) 02/14/2021   Genetic testing 02/25/2018  Family history of breast cancer    Ductal carcinoma in situ (DCIS) of right breast 02/12/2018    has No Known Allergies.  MEDICAL HISTORY: Past Medical History:  Diagnosis Date   Cervical cancer ([place])    Family history of breast cancer    Hypertension     SURGICAL HISTORY: Past Surgical History:  Procedure Laterality Date   BREAST LUMPECTOMY WITH RADIOACTIVE SEED LOCALIZATION Right 03/20/2018   Procedure: RIGHT BREAST  LUMPECTOMY WITH RADIOACTIVE SEED LOCALIZATION;  Surgeon: [name], MD;  Location: [facility];  Service: General;  Laterality: Right;   CHOLECYSTECTOMY     PORTACATH PLACEMENT Right 03/13/2021   Procedure: INSERTION PORT-A-CATH;  Surgeon: Rolm Bookbinder, MD;  Location: [facility];  Service: General;  Laterality: Right;    SOCIAL HISTORY: Social History   Socioeconomic History   Marital status: Married    Spouse name: Not on file   Number of children: Not on file   Years of education: Not on file   Highest education level: Not on file  Occupational History   Not on file  Tobacco Use   Smoking status: Never   Smokeless tobacco: Never  Vaping Use   Vaping Use: Never used  Substance and Sexual Activity   Alcohol use: Yes    Comment: socially   Drug use: Never   Sexual activity: Not on file  Other Topics Concern   Not on file  Social History Narrative   Not on file   Social Determinants of Health   Financial Resource Strain: Not on file  Food Insecurity: Not on file  Transportation Needs: Not on file  Physical Activity: Not on file  Stress: Not on file  Social Connections: Not on file  Intimate Partner Violence: Not on file    FAMILY HISTORY: Family History  Problem Relation Age of Onset   Breast cancer Mother 73       d. 88   Breast cancer Sister 50       triple negative, "negative genetic testing 2019"    Review of Systems  Constitutional:  Negative for appetite change, chills, fatigue, fever and unexpected weight change.  HENT:   Negative for hearing loss, lump/mass and trouble swallowing.   Eyes:  Negative for eye problems and icterus.  Respiratory:  Negative for chest tightness, cough and shortness of breath.   Cardiovascular:  Negative for chest pain, leg swelling and palpitations.  Gastrointestinal:  Negative for abdominal distention, abdominal pain, constipation, diarrhea, nausea and vomiting.  Endocrine: Negative  for hot flashes.  Genitourinary:  Negative for difficulty urinating.   Musculoskeletal:  Negative for arthralgias.  Skin:  Negative for itching and rash.  Neurological:  Negative for dizziness, extremity weakness, headaches and numbness.  Hematological:  Negative for adenopathy. Does not bruise/bleed easily.  Psychiatric/Behavioral:  Negative for depression. The patient is not nervous/anxious.     PHYSICAL EXAMINATION  ECOG PERFORMANCE STATUS: 1 - Symptomatic but completely ambulatory  Vitals:   03/23/21 1039  BP: 121/66  Pulse: 79  Resp: 18  Temp: (!) 97.5 F (36.4 C)  SpO2: 100%    Physical Exam Constitutional:      General: She is not in acute distress.    Appearance: Normal appearance. She is not toxic-appearing.  HENT:     Head: Normocephalic and atraumatic.  Eyes:     General: No scleral icterus. Cardiovascular:     Rate and Rhythm: Normal rate and regular rhythm.     Pulses:  Normal pulses.     Heart sounds: Normal heart sounds.  Pulmonary:     Effort: Pulmonary effort is normal.     Breath sounds: Normal breath sounds.  Abdominal:     General: Abdomen is flat. Bowel sounds are normal. There is no distension.     Palpations: Abdomen is soft.     Tenderness: There is no abdominal tenderness.  Musculoskeletal:        General: No swelling.     Cervical back: Neck supple.  Lymphadenopathy:     Cervical: No cervical adenopathy.  Skin:    General: Skin is warm and dry.     Findings: No rash.  Neurological:     General: No focal deficit present.     Mental Status: She is alert.  Psychiatric:        Mood and Affect: Mood normal.        Behavior: Behavior normal.    LABORATORY DATA:  CBC    Component Value Date/Time   WBC 4.0 03/23/2021 1013   RBC 3.55 (L) 03/23/2021 1013   HGB 10.8 (L) 03/23/2021 1013   HCT 31.6 (L) 03/23/2021 1013   PLT 72 (L) 03/23/2021 1013   MCV 89.0 03/23/2021 1013   MCH 30.4 03/23/2021 1013   MCHC 34.2 03/23/2021 1013   RDW  11.5 03/23/2021 1013   LYMPHSABS 1.1 03/23/2021 1013   MONOABS 0.4 03/23/2021 1013   EOSABS 0.0 03/23/2021 1013   BASOSABS 0.1 03/23/2021 1013    CMP     Component Value Date/Time   NA 138 03/23/2021 1013   K 3.8 03/23/2021 1013   CL 105 03/23/2021 1013   CO2 29 03/23/2021 1013   GLUCOSE 114 (H) 03/23/2021 1013   BUN 8 03/23/2021 1013   CREATININE 0.68 03/23/2021 1013   CALCIUM 8.7 (L) 03/23/2021 1013   PROT 6.4 (L) 03/23/2021 1013   ALBUMIN 3.5 03/23/2021 1013   AST 22 03/23/2021 1013   ALT 57 (H) 03/23/2021 1013   ALKPHOS 66 03/23/2021 1013   BILITOT 0.4 03/23/2021 1013   GFRNONAA >60 03/23/2021 1013   GFRAA >60 02/18/2018 1236       ASSESSMENT and THERAPY PLAN:   Malignant neoplasm of upper-outer quadrant of left breast in female, estrogen receptor positive ([place]) 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  Neoadjuvant chemotherapy with dose dense Adriamycin Cytoxan followed by Taxol   2. mastectomy with sentinel lymph node biopsy 3.  Adjuvant radiation 4.  Followed by adjuvant antiestrogen therapy ------------------------------------------------------------------------------------------ Current Treatment: Cycle 1 day 10 DD Adriamycin and Cytoxan Chemo Toxicities:  She is experiencing increased back pain and in reviewing her labs her white blood cells were 0.9 with a neutrophil count of 0.12 days ago, today her white blood cell count is 4.  Her back pain is likely the Neulasta effect stimulating her white blood cell production from her bone marrow.  Since this is not improving with Tylenol or tramadol  we discussed the following options.  #1 scratch that  1.  1 extra strength Tylenol and 1 Aleve as much is 3 times a day  as needed. 2.  Norco every 8 hours as needed if the Tylenol and Aleve does not help.  She tells me she really does not like taking narcotics however, I am hesitant to send her out of here without a prescription for something stronger should her pain get worse tonight or tomorrow which is the weekend.  We will see her back next week to receive cycle 2 of her chemotherapy.     All questions were answered. The patient knows to call the clinic with any problems, questions or concerns. We can certainly see the patient much sooner if necessary.  Total encounter time: 20 minutes in face-to-face visit time, chart review, lab review, care coordination, order entry, and documentation of the encounter.  Wilber Bihari, NP 03/23/21 11:29 AM Medical Oncology and Hematology [facility] [place], [place] [address] Tel. [phone]    Fax. [phone]  *Total Encounter Time as defined by the Centers for Medicare and Medicaid Services includes, in addition to the face-to-face time of a patient visit (documented in the note above) non-face-to-face time: obtaining and reviewing outside history, ordering and reviewing medications, tests or procedures, care coordination (communications with other health care professionals or caregivers) and documentation in the medical record.

## 2021-03-23 NOTE — Assessment & Plan Note (Signed)
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, PR 0%, Ki-67 40%, HER2 2+ equivocal by IHC, FISH negative Lymph node biopsy: Benign ?? ?Breast MRI 02/12/2021: 4 suspicious masses within the left breast consistent with multifocal, multicentric disease 2 of the 4 masses have been biopsied (2 cm, 1.3 cm, 1.2 cm, 1.7 cm) for mildly prominent level 1 lymph nodes are identified ?? ?Treatment plan: ?1.??Neoadjuvant chemotherapy with dose dense Adriamycin Cytoxan followed by Taxol ? ?2.?mastectomy?with sentinel lymph node biopsy ?3.??Adjuvant radiation ?4.??Followed by adjuvant antiestrogen therapy ?------------------------------------------------------------------------------------------ ?Current Treatment: Cycle 1 day 10 DD Adriamycin and Cytoxan ?Chemo Toxicities: ? ?She is experiencing increased back pain and in reviewing her labs her white blood cells were 0.9 with a neutrophil count of 0.12 days ago, today her white blood cell count is 4.  Her back pain is likely the Neulasta effect stimulating her white blood cell production from her bone marrow.  Since this is not improving with Tylenol or tramadol we discussed the following options.  #1 scratch that ? ?1.  1 extra strength Tylenol and 1 Aleve as much is 3 times a day as needed. ?2.  Norco every 8 hours as needed if the Tylenol and Aleve does not help.  She tells me she really does not like taking narcotics however, I am hesitant to send her out of here without a prescription for something stronger should her pain get worse tonight or tomorrow which is the weekend. ? ?We will see her back next week to receive cycle 2 of her chemotherapy. ? ?

## 2021-03-24 LAB — URINE CULTURE: Culture: <10000 — AB

## 2021-03-27 ENCOUNTER — Other Ambulatory Visit: Payer: Self-pay | Admitting: Hematology and Oncology

## 2021-03-27 ENCOUNTER — Ambulatory Visit: Payer: BC Managed Care – PPO

## 2021-03-27 ENCOUNTER — Other Ambulatory Visit: Payer: BC Managed Care – PPO

## 2021-03-27 ENCOUNTER — Ambulatory Visit: Payer: BC Managed Care – PPO | Admitting: Hematology and Oncology

## 2021-03-27 ENCOUNTER — Telehealth: Payer: Self-pay | Admitting: *Deleted

## 2021-03-27 MED ORDER — ALPRAZOLAM 0.25 MG PO TABS: 0.2500 mg | ORAL_TABLET | Freq: Two times a day (BID) | ORAL | 1 refills | Status: DC | PRN

## 2021-03-27 MED FILL — Fosaprepitant Dimeglumine For IV Infusion 150 MG (Base Eq): INTRAVENOUS | Qty: 5 | Status: AC

## 2021-03-27 MED FILL — Dexamethasone Sodium Phosphate Inj 100 MG/10ML: INTRAMUSCULAR | Qty: 1 | Status: AC

## 2021-03-27 NOTE — Progress Notes (Signed)
? ?Patient Care Team: ?[name], PA as PCP - General (Family Medicine) ?[name], MD as Consulting Physician (General Surgery) ?[name], MD as Consulting Physician (Hematology and Oncology) ?[name], MD as Consulting Physician (Radiation Oncology) ?Rockwell Germany, RN as Oncology Nurse Navigator ?[name], RN as Oncology Nurse Navigator ? ?DIAGNOSIS:  ?  ICD-10-CM   ?1. Malignant neoplasm of upper-outer quadrant of left breast in female, estrogen receptor positive ([place])  C50.412   ? Z17.0   ?  ? ? ?SUMMARY OF ONCOLOGIC HISTORY: ?Oncology History  ?Ductal carcinoma in situ (DCIS) of right breast  ?01/30/2018 Mammogram  ? Diagnostic Mammogram  ?0.4cm cluster of calcifications, 12 o'clock position, 4cm fn, right breast ?  ?02/12/2018 Initial Diagnosis  ? Screening detected right breast calcifications 4 mm size UOQ 11:30 position stereotactic biopsy revealed high-grade DCIS ER 90%, PR 10%, Tis NX stage 0 ?  ?02/18/2018 Genetic Testing  ? Negative.  Genes tested include: ATM, BRCA1, BRCA2, CDH1, CHEK2, PALB2, PTEN, STK11 and TP53; APC, ATM, AXIN2, BARD1, BMPR1A, BRCA1, BRCA2, BRIP1, CDH1, CDKN2A (p14ARF), CDKN2A (p16INK4a), CKD4, CHEK2, CTNNA1, DICER1, EPCAM (Deletion/duplication testing only), GREM1 (promoter region deletion/duplication testing only), KIT, MEN1, MLH1, MSH2, MSH3, MSH6, MUTYH, NBN, NF1, NHTL1, PALB2, PDGFRA, PMS2, POLD1, POLE, PTEN, RAD50, RAD51C, RAD51D, SDHB, SDHC, SDHD, SMAD4, SMARCA4. STK11, TP53, TSC1, TSC2, and VHL.  The following genes were evaluated for sequence changes only: SDHA and HOXB13 c.251G>A variant only. ?  ?03/20/2018 Surgery  ? Right lumpectomy: Scattered microscopic foci of DCIS intermediate grade, margins negative, ER 90%, PR 10%, Tis NX stage 0 ?  ?04/01/2018 Cancer Staging  ? Staging form: Breast, AJCC 8th Edition ?- Pathologic: Stage 0 (pTis (DCIS), pN0, cM0, ER+, PR+) - Signed by Gardenia Phlegm, NP on 04/01/2018 ? ?  ?04/28/2018 - 05/26/2018  Radiation Therapy  ? Adjuvant radiation 1. Right breast; 15 fractions of 2.67 Gy for a total of 40.05 Gy 2. Boost; 5 fractions of 2 Gy for a total of 10 Gy ?  ?  ?05/2018 -  Anti-estrogen oral therapy  ? Tamoxifen daily ?  ?Malignant neoplasm of upper-outer quadrant of left breast in female, estrogen receptor positive ([place])  ?01/19/2021 Initial Diagnosis  ? Left breast biopsy: 2:00 and 4:00: Grade 3 IDC ER 40% weak, PR 0%, Ki-67 40%, HER2 2+ equivocal by IHC, FISH negative ?Lymph node biopsy: Benign ?  ?02/12/2021 Breast MRI  ? Breast MRI 02/12/2021: 4 suspicious masses within the left breast consistent with multifocal, multicentric disease 2 of the 4 masses have been biopsied (2 cm, 1.3 cm, 1.2 cm, 1.7 cm) for mildly prominent level 1 lymph nodes are identified ?  ?03/14/2021 -  Chemotherapy  ? Patient is on Treatment Plan : BREAST ADJUVANT DOSE DENSE AC q14d / PACLitaxel q7d  ?   ? ? ?CHIEF COMPLIANT: Cycle 2 AC ? ?INTERVAL HISTORY: Jasmine Maddox is a 61 y.o. with above-mentioned history of left breast cancer who is currently on chemotherapy with Adriamycin and Cytoxan. She presents to the clinic today for treatment.  She complains of back spasms and was prescribed a medication but she took Motrin and that resolved her back pain. ? ?ALLERGIES:  has No Known Allergies. ? ?MEDICATIONS:  ?Current Outpatient Medications  ?Medication Sig Dispense Refill  ? ALPRAZolam (XANAX) 0.25 MG tablet Take 1 tablet (0.25 mg total) by mouth 2 (two) times daily as needed for anxiety. 30 tablet 1  ? dexamethasone (DECADRON) 4 MG tablet Take 1 tablet (4  mg total) by mouth daily. Take 1 tablet day after chemo and 1 tablet 2 days after chemo with food 8 tablet 0  ? HYDROcodone-acetaminophen (NORCO/VICODIN) 5-325 MG tablet Take 1 tablet by mouth every 8 (eight) hours as needed for moderate pain. 30 tablet 0  ? lidocaine-prilocaine (EMLA) cream Apply to affected area once 30 g 3  ? lisinopril (PRINIVIL,ZESTRIL) 20 MG tablet Take 20 mg by  mouth daily.    ? ondansetron (ZOFRAN) 8 MG tablet Take 1 tablet (8 mg total) by mouth 2 (two) times daily as needed. Start on the third day after chemotherapy. 30 tablet 1  ? prochlorperazine (COMPAZINE) 10 MG tablet Take 1 tablet (10 mg total) by mouth every 6 (six) hours as needed (Nausea or vomiting). 30 tablet 1  ? tamoxifen (NOLVADEX) 20 MG tablet Take 20 mg by mouth daily.    ? ?No current facility-administered medications for this visit.  ? ? ?PHYSICAL EXAMINATION: ?ECOG PERFORMANCE STATUS: 1 - Symptomatic but completely ambulatory ? ?Vitals:  ? 03/28/21 1132  ?BP: (!) 157/81  ?Pulse: 64  ?Resp: 16  ?Temp: 98.1 ?F (36.7 ?C)  ?SpO2: 100%  ? ?Filed Weights  ? 03/28/21 1132  ?Weight: 145 lb (65.8 kg)  ? ? ?LABORATORY DATA:  ?I have reviewed the data as listed ?CMP Latest Ref Rng & Units 03/23/2021 03/21/2021 03/14/2021  ?Glucose 70 - 99 mg/dL 114(H) 117(H) 117(H)  ?BUN 6 - 20 mg/dL '8 15 11  ' ?Creatinine 0.44 - 1.00 mg/dL 0.68 0.67 0.75  ?Sodium 135 - 145 mmol/L 138 138 140  ?Potassium 3.5 - 5.1 mmol/L 3.8 3.6 3.6  ?Chloride 98 - 111 mmol/L 105 104 106  ?CO2 22 - 32 mmol/L '29 29 29  ' ?Calcium 8.9 - 10.3 mg/dL 8.7(L) 8.4(L) 8.8(L)  ?Total Protein 6.5 - 8.1 g/dL 6.4(L) 6.7 6.9  ?Total Bilirubin 0.3 - 1.2 mg/dL 0.4 0.2(L) 0.4  ?Alkaline Phos 38 - 126 U/L 66 63 54  ?AST 15 - 41 U/L '22 27 30  ' ?ALT 0 - 44 U/L 57(H) 72(H) 47(H)  ? ? ?Lab Results  ?Component Value Date  ? WBC 11.0 (H) 03/28/2021  ? HGB 11.0 (L) 03/28/2021  ? HCT 32.0 (L) 03/28/2021  ? MCV 90.4 03/28/2021  ? PLT 218 03/28/2021  ? NEUTROABS PENDING 03/28/2021  ? ? ?ASSESSMENT & PLAN:  ?Malignant neoplasm of upper-outer quadrant of left breast in female, estrogen receptor positive ([place]) ?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, PR 0%, Ki-67 40%, HER2 2+ equivocal by IHC, FISH negative Lymph node biopsy: Benign ?  ?Breast MRI 02/12/2021: 4 suspicious masses within the left breast consistent with multifocal, multicentric disease 2 of  the 4 masses have been biopsied (2 cm, 1.3 cm, 1.2 cm, 1.7 cm) for mildly prominent level 1 lymph nodes are identified ?  ?Treatment plan: ?1.  Neoadjuvant chemotherapy with dose dense Adriamycin Cytoxan followed by Taxol   ?2. mastectomy with sentinel lymph node biopsy ?3.  Adjuvant radiation ?4.  Followed by adjuvant antiestrogen therapy ?------------------------------------------------------------------------------------------ ?Current Treatment: Cycle 2 DD Adriamycin and Cytoxan ?Chemo Toxicities: ?Severe neutropenia at nadir count: ANC 0.1: We reduced the dosage of cycle 2. ?Severe anxiety: Provided her with antianxiety medication.  She has not been using it.Marland Kitchen ?Mild nausea: Resolved ?Chemotherapy-induced anemia: Monitoring closely today's hemoglobin is 11 ?Back spasms related to Neulasta: She will take Motrin and Tylenol if needed ?  ?RTC in 2 weeks for cycle 3 ?  ? ?No orders of  the defined types were placed in this encounter. ? ?The patient has a good understanding of the overall plan. she agrees with it. she will call with any problems that may develop before the next visit here. ? ?Total time spent: 30 mins including face to face time and time spent for planning, charting and coordination of care ? ?Rulon Eisenmenger, MD, MPH ?03/28/2021 ? ?I, Thana Ates, am acting as scribe for Dr. [name]. ? ?I have reviewed the above documentation for accuracy and completeness, and I agree with the above. ? ? ? ? ? ? ?

## 2021-03-27 NOTE — Telephone Encounter (Signed)
Received call from pt with complain of increase in anxiety related to chemotherapy tx and recent cancer diagnosis.  Pt requesting a PRN medication at home to help alleviate some anxiety.  RN will review with MD for further recommendations.  ?

## 2021-03-28 ENCOUNTER — Inpatient Hospital Stay: Payer: BC Managed Care – PPO

## 2021-03-28 ENCOUNTER — Telehealth: Payer: Self-pay | Admitting: Hematology and Oncology

## 2021-03-28 ENCOUNTER — Other Ambulatory Visit: Payer: Self-pay

## 2021-03-28 ENCOUNTER — Inpatient Hospital Stay (HOSPITAL_BASED_OUTPATIENT_CLINIC_OR_DEPARTMENT_OTHER): Payer: BC Managed Care – PPO | Admitting: Hematology and Oncology

## 2021-03-28 ENCOUNTER — Ambulatory Visit: Payer: BC Managed Care – PPO

## 2021-03-28 DIAGNOSIS — C50412 Malignant neoplasm of upper-outer quadrant of left female breast: Secondary | ICD-10-CM

## 2021-03-28 DIAGNOSIS — Z170 Estrogen receptor positive status [ER+]: Secondary | ICD-10-CM | POA: Diagnosis not present

## 2021-03-28 DIAGNOSIS — Z5111 Encounter for antineoplastic chemotherapy: Secondary | ICD-10-CM | POA: Diagnosis not present

## 2021-03-28 DIAGNOSIS — D0511 Intraductal carcinoma in situ of right breast: Secondary | ICD-10-CM | POA: Diagnosis not present

## 2021-03-28 DIAGNOSIS — Z5189 Encounter for other specified aftercare: Secondary | ICD-10-CM | POA: Diagnosis not present

## 2021-03-28 DIAGNOSIS — M549 Dorsalgia, unspecified: Secondary | ICD-10-CM | POA: Diagnosis not present

## 2021-03-28 LAB — CMP (CANCER CENTER ONLY)
ALT: 51 U/L — ABNORMAL HIGH (ref 0–44)
AST: 24 U/L (ref 15–41)
Albumin: 3.9 g/dL (ref 3.5–5.0)
Alkaline Phosphatase: 99 U/L (ref 38–126)
Anion gap: 4 — ABNORMAL LOW (ref 5–15)
BUN: 11 mg/dL (ref 6–20)
CO2: 32 mmol/L (ref 22–32)
Calcium: 9.4 mg/dL (ref 8.9–10.3)
Chloride: 104 mmol/L (ref 98–111)
Creatinine: 0.75 mg/dL (ref 0.44–1.00)
GFR, Estimated: >60 mL/min (ref >60)
Glucose, Bld: 93 mg/dL (ref 70–99)
Potassium: 4.2 mmol/L (ref 3.5–5.1)
Sodium: 140 mmol/L (ref 135–145)
Total Bilirubin: 0.3 mg/dL (ref 0.3–1.2)
Total Protein: 6.8 g/dL (ref 6.5–8.1)

## 2021-03-28 LAB — CBC WITH DIFFERENTIAL (CANCER CENTER ONLY)
Abs Immature Granulocytes: 0.8 10*3/uL — ABNORMAL HIGH (ref 0.00–0.07)
Basophils Absolute: 0 10*3/uL (ref 0.0–0.1)
Basophils Relative: 0 %
Eosinophils Absolute: 0 10*3/uL (ref 0.0–0.5)
Eosinophils Relative: 0 %
HCT: 32 % — ABNORMAL LOW (ref 36.0–46.0)
Hemoglobin: 11 g/dL — ABNORMAL LOW (ref 12.0–15.0)
Immature Granulocytes: 7 %
Lymphocytes Relative: 11 %
Lymphs Abs: 1.2 10*3/uL (ref 0.7–4.0)
MCH: 31.1 pg (ref 26.0–34.0)
MCHC: 34.4 g/dL (ref 30.0–36.0)
MCV: 90.4 fL (ref 80.0–100.0)
Monocytes Absolute: 1.4 10*3/uL — ABNORMAL HIGH (ref 0.1–1.0)
Monocytes Relative: 13 %
Neutro Abs: 7.6 10*3/uL (ref 1.7–7.7)
Neutrophils Relative %: 69 %
Platelet Count: 218 10*3/uL (ref 150–400)
RBC: 3.54 MIL/uL — ABNORMAL LOW (ref 3.87–5.11)
RDW: 12.6 % (ref 11.5–15.5)
WBC Count: 11 10*3/uL — ABNORMAL HIGH (ref 4.0–10.5)
nRBC: 0.8 % — ABNORMAL HIGH (ref 0.0–0.2)

## 2021-03-28 MED ORDER — SODIUM CHLORIDE 0.9% FLUSH
10.0000 mL | Freq: Once | INTRAVENOUS | Status: AC
  Administered 2021-03-28: 10 mL

## 2021-03-28 MED ORDER — HEPARIN SOD (PORK) LOCK FLUSH 100 UNIT/ML IV SOLN
500.0000 [IU] | Freq: Once | INTRAVENOUS | Status: AC | PRN
  Administered 2021-03-28: 500 [IU]

## 2021-03-28 MED ORDER — DOXORUBICIN HCL CHEMO IV INJECTION 2 MG/ML
50.0000 mg/m2 | Freq: Once | INTRAVENOUS | Status: AC
  Administered 2021-03-28: 86 mg via INTRAVENOUS
  Filled 2021-03-28: qty 43

## 2021-03-28 MED ORDER — SODIUM CHLORIDE 0.9 % IV SOLN: Freq: Once | INTRAVENOUS | Status: AC

## 2021-03-28 MED ORDER — SODIUM CHLORIDE 0.9 % IV SOLN
150.0000 mg | Freq: Once | INTRAVENOUS | Status: AC
  Administered 2021-03-28: 150 mg via INTRAVENOUS
  Filled 2021-03-28: qty 150

## 2021-03-28 MED ORDER — PALONOSETRON HCL INJECTION 0.25 MG/5ML
0.2500 mg | Freq: Once | INTRAVENOUS | Status: AC
  Administered 2021-03-28: 0.25 mg via INTRAVENOUS
  Filled 2021-03-28: qty 5

## 2021-03-28 MED ORDER — SODIUM CHLORIDE 0.9% FLUSH
10.0000 mL | INTRAVENOUS | Status: DC | PRN
  Administered 2021-03-28: 10 mL

## 2021-03-28 MED ORDER — SODIUM CHLORIDE 0.9 % IV SOLN
500.0000 mg/m2 | Freq: Once | INTRAVENOUS | Status: AC
  Administered 2021-03-28: 860 mg via INTRAVENOUS
  Filled 2021-03-28: qty 43

## 2021-03-28 MED ORDER — SODIUM CHLORIDE 0.9 % IV SOLN
10.0000 mg | Freq: Once | INTRAVENOUS | Status: AC
  Administered 2021-03-28: 10 mg via INTRAVENOUS
  Filled 2021-03-28: qty 10

## 2021-03-28 NOTE — Progress Notes (Signed)
Per Melissa in lab - [place] is 7.56 today.  ?

## 2021-03-28 NOTE — Progress Notes (Signed)
Dr. Lindi Adie was made aware of the pt's incision is slightly open. No pain, tenderness or drainage coming from area. Dr. Lindi Adie has already looked at this last visit and says it looks okay! Proceeding with accessing port today.  ?

## 2021-03-28 NOTE — Patient Instructions (Signed)
[facility]   ?Discharge Instructions: ?Thank you for choosing [facility] to provide your oncology and hematology care.  ? ?If you have a lab appointment with the [facility], please go directly to the [facility] and check in at the registration area. ?  ?Wear comfortable clothing and clothing appropriate for easy access to any Portacath or PICC line.  ? ?We strive to give you quality time with your provider. You may need to reschedule your appointment if you arrive late (15 or more minutes).  Arriving late affects you and other patients whose appointments are after yours.  Also, if you miss three or more appointments without notifying the office, you may be dismissed from the clinic at the provider?s discretion.    ?  ?For prescription refill requests, have your pharmacy contact our office and allow 72 hours for refills to be completed.   ? ?Today you received the following chemotherapy and/or immunotherapy agents: Doxorubicin (Adriamycin) and Cyclophosphamide (Cytoxan)  ?  ?To help prevent nausea and vomiting after your treatment, we encourage you to take your nausea medication as directed. ? ?BELOW ARE SYMPTOMS THAT SHOULD BE REPORTED IMMEDIATELY: ?*FEVER GREATER THAN 100.4 F (38 ?C) OR HIGHER ?*CHILLS OR SWEATING ?*NAUSEA AND VOMITING THAT IS NOT CONTROLLED WITH YOUR NAUSEA MEDICATION ?*UNUSUAL SHORTNESS OF BREATH ?*UNUSUAL BRUISING OR BLEEDING ?*URINARY PROBLEMS (pain or burning when urinating, or frequent urination) ?*BOWEL PROBLEMS (unusual diarrhea, constipation, pain near the anus) ?TENDERNESS IN MOUTH AND THROAT WITH OR WITHOUT PRESENCE OF ULCERS (sore throat, sores in mouth, or a toothache) ?UNUSUAL RASH, SWELLING OR PAIN  ?UNUSUAL VAGINAL DISCHARGE OR ITCHING  ? ?Items with * indicate a potential emergency and should be followed up as soon as possible or go to the Emergency Department if any problems should occur. ? ?Please show the CHEMOTHERAPY ALERT  CARD or IMMUNOTHERAPY ALERT CARD at check-in to the Emergency Department and triage nurse. ? ?Should you have questions after your visit or need to cancel or reschedule your appointment, please contact [facility]  Dept: [phone]  and follow the prompts.  Office hours are 8:00 a.m. to 4:30 p.m. Monday - Friday. Please note that voicemails left after 4:00 p.m. may not be returned until the following business day.  We are closed weekends and major holidays. You have access to a nurse at all times for urgent questions. Please call the main number to the clinic Dept: [phone] and follow the prompts. ? ? ?For any non-urgent questions, you may also contact your provider using MyChart. We now offer e-Visits for anyone 79 and older to request care online for non-urgent symptoms. For details visit [email or web address]. ?  ?Also download the MyChart app! Go to the app store, search "MyChart", open the app, select [facility], and log in with your MyChart username and password. ? ?Due to Covid, a mask is required upon entering the hospital/clinic. If you do not have a mask, one will be given to you upon arrival. For doctor visits, patients may have 1 support person aged 3 or older with them. For treatment visits, patients cannot have anyone with them due to current Covid guidelines and our immunocompromised population.  ? ?

## 2021-03-28 NOTE — Assessment & Plan Note (Signed)
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, PR 0%, Ki-67 40%, HER2 2+ equivocal by IHC, FISH negative Lymph node biopsy: Benign ?? ?Breast MRI 02/12/2021: 4 suspicious masses within the left breast consistent with multifocal, multicentric disease 2 of the 4 masses have been biopsied (2 cm, 1.3 cm, 1.2 cm, 1.7 cm) for mildly prominent level 1 lymph nodes are identified ?? ?Treatment plan: ?1.??Neoadjuvant chemotherapy with dose dense Adriamycin Cytoxan followed by Taxol ? ?2.?mastectomy?with sentinel lymph node biopsy ?3.??Adjuvant radiation ?4.??Followed by adjuvant antiestrogen therapy ?------------------------------------------------------------------------------------------ ?Current Treatment: Cycle 2 DD Adriamycin and Cytoxan ?Chemo Toxicities: ?1. Severe neutropenia at nadir count: ANC 0.1: We will reduce the dosage of cycle 2. ?2. Severe anxiety: It appears to have gotten worse.  I offered her medication help but she wants to do it by herself.  We discussed the role of Insight timer app based guided imagery. ?3. Mild nausea: Because she was not eating much food. ?? ?RTC in 2 weeks for cycle 3 ?? ?

## 2021-03-28 NOTE — Telephone Encounter (Signed)
Rescheduled appointment per provider. Called patient to update her on the changes made to her upcoming appointment. Patient is aware. ?

## 2021-03-29 ENCOUNTER — Ambulatory Visit: Payer: BC Managed Care – PPO

## 2021-03-30 ENCOUNTER — Telehealth: Payer: Self-pay | Admitting: *Deleted

## 2021-03-30 ENCOUNTER — Inpatient Hospital Stay: Payer: BC Managed Care – PPO

## 2021-03-30 ENCOUNTER — Other Ambulatory Visit: Payer: Self-pay

## 2021-03-30 VITALS — BP 136/73 | HR 68 | Temp 98.7°F | Resp 18

## 2021-03-30 DIAGNOSIS — Z5111 Encounter for antineoplastic chemotherapy: Secondary | ICD-10-CM | POA: Diagnosis not present

## 2021-03-30 DIAGNOSIS — Z170 Estrogen receptor positive status [ER+]: Secondary | ICD-10-CM

## 2021-03-30 DIAGNOSIS — M549 Dorsalgia, unspecified: Secondary | ICD-10-CM | POA: Diagnosis not present

## 2021-03-30 DIAGNOSIS — Z5189 Encounter for other specified aftercare: Secondary | ICD-10-CM | POA: Diagnosis not present

## 2021-03-30 DIAGNOSIS — C50412 Malignant neoplasm of upper-outer quadrant of left female breast: Secondary | ICD-10-CM

## 2021-03-30 DIAGNOSIS — D0511 Intraductal carcinoma in situ of right breast: Secondary | ICD-10-CM | POA: Diagnosis not present

## 2021-03-30 MED ORDER — PEGFILGRASTIM-CBQV 6 MG/0.6ML ~~LOC~~ SOSY
6.0000 mg | PREFILLED_SYRINGE | Freq: Once | SUBCUTANEOUS | Status: AC
  Administered 2021-03-30: 6 mg via SUBCUTANEOUS
  Filled 2021-03-30: qty 0.6

## 2021-03-30 NOTE — Telephone Encounter (Signed)
Unable to connect with patient after today's injection appointment.   ?Return call received of Jasmine Maddox [phone] (home) as requested by this forms nurse.   ?Informed of H.I.P.A.A. protocol for electronic, paper or oral forms used to provide patients' [facility] information, requires patient sign the "[place] for Use/Disclosure of Protected Health Information (PHI)".  Yesterday (03/29/2021), form staff received an [place] form for claim No.#: [id number]. ?"Leave form somewhere for me to sign next Wednesday when I return to see the doctor and infusion." ?This nurse leaves [facility] Authorization forms at desk of main entry receptionist for patient signature.  Form staff rounds daily to designated form area for pick up. ?Currently no further questions or needs. ?

## 2021-04-03 ENCOUNTER — Other Ambulatory Visit: Payer: Self-pay | Admitting: *Deleted

## 2021-04-03 ENCOUNTER — Telehealth: Payer: Self-pay | Admitting: Hematology and Oncology

## 2021-04-03 DIAGNOSIS — C50412 Malignant neoplasm of upper-outer quadrant of left female breast: Secondary | ICD-10-CM

## 2021-04-03 NOTE — Telephone Encounter (Signed)
.  Called pt per 3/14 inbasket , Patient was unavailable, a message with appt time and date was left with number on file.  ? ?

## 2021-04-03 NOTE — Progress Notes (Signed)
Received call from pt with compliant of burning with urination and increase in frequency.  Pt also complaint of sores in bilateral nostrils.  Per MD pt needing to have UA and culture preformed as well as f/u with [facility].  High priority message sent to scheduling.  ?

## 2021-04-04 ENCOUNTER — Inpatient Hospital Stay: Payer: BC Managed Care – PPO

## 2021-04-04 ENCOUNTER — Inpatient Hospital Stay (HOSPITAL_BASED_OUTPATIENT_CLINIC_OR_DEPARTMENT_OTHER): Payer: BC Managed Care – PPO | Admitting: Adult Health

## 2021-04-04 ENCOUNTER — Other Ambulatory Visit: Payer: Self-pay

## 2021-04-04 VITALS — BP 131/68 | HR 76 | Temp 97.5°F | Resp 16 | Ht 63.0 in | Wt 144.5 lb

## 2021-04-04 DIAGNOSIS — C50412 Malignant neoplasm of upper-outer quadrant of left female breast: Secondary | ICD-10-CM

## 2021-04-04 DIAGNOSIS — D0511 Intraductal carcinoma in situ of right breast: Secondary | ICD-10-CM

## 2021-04-04 DIAGNOSIS — Z170 Estrogen receptor positive status [ER+]: Secondary | ICD-10-CM

## 2021-04-04 DIAGNOSIS — Z5189 Encounter for other specified aftercare: Secondary | ICD-10-CM | POA: Diagnosis not present

## 2021-04-04 DIAGNOSIS — M549 Dorsalgia, unspecified: Secondary | ICD-10-CM | POA: Diagnosis not present

## 2021-04-04 DIAGNOSIS — Z5111 Encounter for antineoplastic chemotherapy: Secondary | ICD-10-CM | POA: Diagnosis not present

## 2021-04-04 LAB — CBC WITH DIFFERENTIAL (CANCER CENTER ONLY)
Abs Immature Granulocytes: 0.13 10*3/uL — ABNORMAL HIGH (ref 0.00–0.07)
Basophils Absolute: 0.1 10*3/uL (ref 0.0–0.1)
Basophils Relative: 3 %
Eosinophils Absolute: 0 10*3/uL (ref 0.0–0.5)
Eosinophils Relative: 0 %
HCT: 31.8 % — ABNORMAL LOW (ref 36.0–46.0)
Hemoglobin: 10.5 g/dL — ABNORMAL LOW (ref 12.0–15.0)
Immature Granulocytes: 5 %
Lymphocytes Relative: 15 %
Lymphs Abs: 0.4 10*3/uL — ABNORMAL LOW (ref 0.7–4.0)
MCH: 30.2 pg (ref 26.0–34.0)
MCHC: 33 g/dL (ref 30.0–36.0)
MCV: 91.4 fL (ref 80.0–100.0)
Monocytes Absolute: 0.3 10*3/uL (ref 0.1–1.0)
Monocytes Relative: 10 %
Neutro Abs: 1.7 10*3/uL (ref 1.7–7.7)
Neutrophils Relative %: 67 %
Platelet Count: 173 10*3/uL (ref 150–400)
RBC: 3.48 MIL/uL — ABNORMAL LOW (ref 3.87–5.11)
RDW: 12.4 % (ref 11.5–15.5)
Smear Review: NORMAL
WBC Count: 2.6 10*3/uL — ABNORMAL LOW (ref 4.0–10.5)
nRBC: 0 % (ref 0.0–0.2)

## 2021-04-04 LAB — CMP (CANCER CENTER ONLY)
ALT: 26 U/L (ref 0–44)
AST: 17 U/L (ref 15–41)
Albumin: 3.8 g/dL (ref 3.5–5.0)
Alkaline Phosphatase: 93 U/L (ref 38–126)
Anion gap: 6 (ref 5–15)
BUN: 13 mg/dL (ref 6–20)
CO2: 30 mmol/L (ref 22–32)
Calcium: 9.2 mg/dL (ref 8.9–10.3)
Chloride: 103 mmol/L (ref 98–111)
Creatinine: 0.66 mg/dL (ref 0.44–1.00)
GFR, Estimated: >60 mL/min (ref >60)
Glucose, Bld: 121 mg/dL — ABNORMAL HIGH (ref 70–99)
Potassium: 3.8 mmol/L (ref 3.5–5.1)
Sodium: 139 mmol/L (ref 135–145)
Total Bilirubin: 0.4 mg/dL (ref 0.3–1.2)
Total Protein: 6.9 g/dL (ref 6.5–8.1)

## 2021-04-04 LAB — URINALYSIS, COMPLETE (UACMP) WITH MICROSCOPIC
Bilirubin Urine: NEGATIVE
Glucose, UA: NEGATIVE mg/dL
Ketones, ur: NEGATIVE mg/dL
Leukocytes,Ua: NEGATIVE
Nitrite: NEGATIVE
Protein, ur: NEGATIVE mg/dL
Specific Gravity, Urine: 1.02 (ref 1.005–1.030)
pH: 5 (ref 5.0–8.0)

## 2021-04-04 MED ORDER — MUPIROCIN 2 % EX OINT: 1.0000 "application " | TOPICAL_OINTMENT | Freq: Every day | CUTANEOUS | 0 refills | Status: DC

## 2021-04-04 NOTE — Patient Instructions (Signed)
Put a very small 47m gtt  ?

## 2021-04-04 NOTE — Progress Notes (Signed)
[facility] Follow up: ?  ? ?Jasmine Huxley, PA ?[address] ?[place] [address] ? ? ?DIAGNOSIS:  Cancer Staging  ?Ductal carcinoma in situ (DCIS) of right breast ?Staging form: Breast, AJCC 8th Edition ?- Clinical stage from 02/18/2018: Stage 0 (cTis (DCIS), cN0, cM0, ER+, PR+) - Unsigned ?Nuclear grade: G3 ?Laterality: Right ?- Pathologic: Stage 0 (pTis (DCIS), pN0, cM0, ER+, PR+) - Signed by Gardenia Phlegm, NP on 04/01/2018 ? ? ?SUMMARY OF ONCOLOGIC HISTORY: ?Oncology History  ?Ductal carcinoma in situ (DCIS) of right breast  ?01/30/2018 Mammogram  ? Diagnostic Mammogram  ?0.4cm cluster of calcifications, 12 o'clock position, 4cm fn, right breast ?  ?02/12/2018 Initial Diagnosis  ? Screening detected right breast calcifications 4 mm size UOQ 11:30 position stereotactic biopsy revealed high-grade DCIS ER 90%, PR 10%, Tis NX stage 0 ?  ?02/18/2018 Genetic Testing  ? Negative.  Genes tested include: ATM, BRCA1, BRCA2, CDH1, CHEK2, PALB2, PTEN, STK11 and TP53; APC, ATM, AXIN2, BARD1, BMPR1A, BRCA1, BRCA2, BRIP1, CDH1, CDKN2A (p14ARF), CDKN2A (p16INK4a), CKD4, CHEK2, CTNNA1, DICER1, EPCAM (Deletion/duplication testing only), GREM1 (promoter region deletion/duplication testing only), KIT, MEN1, MLH1, MSH2, MSH3, MSH6, MUTYH, NBN, NF1, NHTL1, PALB2, PDGFRA, PMS2, POLD1, POLE, PTEN, RAD50, RAD51C, RAD51D, SDHB, SDHC, SDHD, SMAD4, SMARCA4. STK11, TP53, TSC1, TSC2, and VHL.  The following genes were evaluated for sequence changes only: SDHA and HOXB13 c.251G>A variant only. ?  ?03/20/2018 Surgery  ? Right lumpectomy: Scattered microscopic foci of DCIS intermediate grade, margins negative, ER 90%, PR 10%, Tis NX stage 0 ?  ?04/01/2018 Cancer Staging  ? Staging form: Breast, AJCC 8th Edition ?- Pathologic: Stage 0 (pTis (DCIS), pN0, cM0, ER+, PR+) - Signed by Gardenia Phlegm, NP on 04/01/2018 ? ?  ?04/28/2018 - 05/26/2018 Radiation Therapy  ? Adjuvant radiation 1. Right breast; 15  fractions of 2.67 Gy for a total of 40.05 Gy 2. Boost; 5 fractions of 2 Gy for a total of 10 Gy ?  ?  ?05/2018 -  Anti-estrogen oral therapy  ? Tamoxifen daily ?  ?Malignant neoplasm of upper-outer quadrant of left breast in female, estrogen receptor positive ([facility])  ?01/19/2021 Initial Diagnosis  ? Left breast biopsy: 2:00 and 4:00: Grade 3 IDC ER 40% weak, PR 0%, Ki-67 40%, HER2 2+ equivocal by IHC, FISH negative ?Lymph node biopsy: Benign ?  ?02/12/2021 Breast MRI  ? Breast MRI 02/12/2021: 4 suspicious masses within the left breast consistent with multifocal, multicentric disease 2 of the 4 masses have been biopsied (2 cm, 1.3 cm, 1.2 cm, 1.7 cm) for mildly prominent level 1 lymph nodes are identified ?  ?03/14/2021 -  Chemotherapy  ? Patient is on Treatment Plan : BREAST ADJUVANT DOSE DENSE AC q14d / PACLitaxel q7d  ?   ? ? ?CURRENT THERAPY: Adriamycin/Cytoxan ? ?INTERVAL HISTORY: ?Jasmine Maddox 61 y.o. female returns for evaluation of dysuria and sores in her nose.  She started experiencing these a few days ago.  She denies any epistaxis.  She has not had any fever or chills, hematuria, or any other concerns.   ? ? ?Patient Active Problem List  ? Diagnosis Date Noted  ? Malignant neoplasm of upper-outer quadrant of left breast in female, estrogen receptor positive ([facility]) 02/14/2021  ? Genetic testing 02/25/2018  ? Family history of breast cancer   ? Ductal carcinoma in situ (DCIS) of right breast 02/12/2018  ? ? ?has No Known Allergies. ? ?MEDICAL HISTORY: ?Past Medical History:  ?Diagnosis Date  ?  Cervical cancer ([facility])   ? Family history of breast cancer   ? Hypertension   ? ? ?SURGICAL HISTORY: ?Past Surgical History:  ?Procedure Laterality Date  ? BREAST LUMPECTOMY WITH RADIOACTIVE SEED LOCALIZATION Right 03/20/2018  ? Procedure: RIGHT BREAST LUMPECTOMY WITH RADIOACTIVE SEED LOCALIZATION;  Surgeon: [name], MD;  Location: [facility];  Service: General;  Laterality: Right;  ? CHOLECYSTECTOMY     ? PORTACATH PLACEMENT Right 03/13/2021  ? Procedure: INSERTION PORT-A-CATH;  Surgeon: Rolm Bookbinder, MD;  Location: [facility];  Service: General;  Laterality: Right;  ? ? ?SOCIAL HISTORY: ?Social History  ? ?Socioeconomic History  ? Marital status: Married  ?  Spouse name: Not on file  ? Number of children: Not on file  ? Years of education: Not on file  ? Highest education level: Not on file  ?Occupational History  ? Not on file  ?Tobacco Use  ? Smoking status: Never  ? Smokeless tobacco: Never  ?Vaping Use  ? Vaping Use: Never used  ?Substance and Sexual Activity  ? Alcohol use: Yes  ?  Comment: socially  ? Drug use: Never  ? Sexual activity: Not on file  ?Other Topics Concern  ? Not on file  ?Social History Narrative  ? Not on file  ? ?Social Determinants of Health  ? ?Financial Resource Strain: Not on file  ?Food Insecurity: Not on file  ?Transportation Needs: Not on file  ?Physical Activity: Not on file  ?Stress: Not on file  ?Social Connections: Not on file  ?Intimate Partner Violence: Not on file  ? ? ?FAMILY HISTORY: ?Family History  ?Problem Relation Age of Onset  ? Breast cancer Mother 63  ?     d. 27  ? Breast cancer Sister 61  ?     triple negative, "negative genetic testing 2019"  ? ? ?Review of Systems  ?Constitutional:  Negative for appetite change, chills, fatigue, fever and unexpected weight change.  ?HENT:   Negative for hearing loss, lump/mass and trouble swallowing.   ?Eyes:  Negative for eye problems and icterus.  ?Respiratory:  Negative for chest tightness, cough and shortness of breath.   ?Cardiovascular:  Negative for chest pain, leg swelling and palpitations.  ?Gastrointestinal:  Negative for abdominal distention, abdominal pain, constipation, diarrhea, nausea and vomiting.  ?Endocrine: Negative for hot flashes.  ?Genitourinary:  Negative for difficulty urinating.   ?Musculoskeletal:  Negative for arthralgias.  ?Skin:  Negative for itching and rash.  ?Neurological:   Negative for dizziness, extremity weakness, headaches and numbness.  ?Hematological:  Negative for adenopathy. Does not bruise/bleed easily.  ?Psychiatric/Behavioral:  Negative for depression. The patient is not nervous/anxious.    ? ? ?PHYSICAL EXAMINATION ? ?ECOG PERFORMANCE STATUS: 1 - Symptomatic but completely ambulatory ? ?Vitals:  ? 04/04/21 0926  ?BP: 131/68  ?Pulse: 76  ?Resp: 16  ?Temp: (!) 97.5 ?F (36.4 ?C)  ?SpO2: 100%  ? ? ?Physical Exam ?Constitutional:   ?   General: She is not in acute distress. ?   Appearance: Normal appearance. She is not toxic-appearing.  ?HENT:  ?   Head: Normocephalic and atraumatic.  ?   Nose:  ?   Comments: Small erythema and slight sores in bilateral anterior nares ?Eyes:  ?   General: No scleral icterus. ?Cardiovascular:  ?   Rate and Rhythm: Normal rate and regular rhythm.  ?   Pulses: Normal pulses.  ?   Heart sounds: Normal heart sounds.  ?Pulmonary:  ?  Effort: Pulmonary effort is normal.  ?   Breath sounds: Normal breath sounds.  ?Abdominal:  ?   General: Abdomen is flat. Bowel sounds are normal. There is no distension.  ?   Palpations: Abdomen is soft.  ?   Tenderness: There is no abdominal tenderness.  ?Musculoskeletal:     ?   General: No swelling.  ?   Cervical back: Neck supple.  ?Lymphadenopathy:  ?   Cervical: No cervical adenopathy.  ?Skin: ?   General: Skin is warm and dry.  ?   Findings: No rash.  ?Neurological:  ?   General: No focal deficit present.  ?   Mental Status: She is alert.  ?Psychiatric:     ?   Mood and Affect: Mood normal.     ?   Behavior: Behavior normal.  ? ? ?LABORATORY DATA: ? ?CBC ?   ?Component Value Date/Time  ? WBC 2.6 (L) 04/04/2021 0277  ? RBC 3.48 (L) 04/04/2021 4128  ? HGB 10.5 (L) 04/04/2021 7867  ? HCT 31.8 (L) 04/04/2021 6720  ? PLT 173 04/04/2021 0907  ? MCV 91.4 04/04/2021 0907  ? MCH 30.2 04/04/2021 0907  ? MCHC 33.0 04/04/2021 0907  ? RDW 12.4 04/04/2021 0907  ? LYMPHSABS 0.4 (L) 04/04/2021 9470  ? MONOABS 0.3 04/04/2021 0907   ? EOSABS 0.0 04/04/2021 0907  ? BASOSABS 0.1 04/04/2021 0907  ? ? ?CMP  ?   ?Component Value Date/Time  ? NA 139 04/04/2021 0907  ? K 3.8 04/04/2021 0907  ? CL 103 04/04/2021 0907  ? CO2 30 04/04/2021 0907

## 2021-04-05 LAB — URINE CULTURE: Culture: NO GROWTH

## 2021-04-06 ENCOUNTER — Encounter: Payer: Self-pay | Admitting: Hematology and Oncology

## 2021-04-06 ENCOUNTER — Encounter: Payer: Self-pay | Admitting: Adult Health

## 2021-04-06 NOTE — Assessment & Plan Note (Signed)
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, PR 0%, Ki-67 40%, HER2 2+ equivocal by IHC, FISH negative Lymph node biopsy: Benign ?? ?Breast MRI 02/12/2021: 4 suspicious masses within the left breast consistent with multifocal, multicentric disease 2 of the 4 masses have been biopsied (2 cm, 1.3 cm, 1.2 cm, 1.7 cm) for mildly prominent level 1 lymph nodes are identified ?? ?Treatment plan: ?1.??Neoadjuvant chemotherapy with dose dense Adriamycin Cytoxan followed by Taxol ? ?2.?mastectomy?with sentinel lymph node biopsy ?3.??Adjuvant radiation ?4.??Followed by adjuvant antiestrogen therapy ?------------------------------------------------------------------------------------------ ?Current Treatment: Cycle 2 DD Adriamycin and Cytoxan ?Chemo Toxicities: ? ?Her urinalysis does not show infection.  I recommended she increase her water intake ? ?Her anterior nare sores can happen with chemo.  I sent in some ointment and recommended saline nasal spray to help lubricate the area.   ?? ?

## 2021-04-10 ENCOUNTER — Ambulatory Visit: Payer: BC Managed Care – PPO | Admitting: Hematology and Oncology

## 2021-04-10 ENCOUNTER — Ambulatory Visit: Payer: BC Managed Care – PPO

## 2021-04-10 ENCOUNTER — Other Ambulatory Visit: Payer: BC Managed Care – PPO

## 2021-04-10 NOTE — Progress Notes (Signed)
? ?Patient Care Team: ?[name], PA as PCP - General (Family Medicine) ?[name], MD as Consulting Physician (General Surgery) ?[name], MD as Consulting Physician (Hematology and Oncology) ?[name], MD as Consulting Physician (Radiation Oncology) ?Rockwell Germany, RN as Oncology Nurse Navigator ?[name], RN as Oncology Nurse Navigator ? ?DIAGNOSIS:  ?Encounter Diagnosis  ?Name Primary?  ? Ductal carcinoma in situ (DCIS) of right breast   ? ? ?SUMMARY OF ONCOLOGIC HISTORY: ?Oncology History  ?Ductal carcinoma in situ (DCIS) of right breast  ?01/30/2018 Mammogram  ? Diagnostic Mammogram  ?0.4cm cluster of calcifications, 12 o'clock position, 4cm fn, right breast ?  ?02/12/2018 Initial Diagnosis  ? Screening detected right breast calcifications 4 mm size UOQ 11:30 position stereotactic biopsy revealed high-grade DCIS ER 90%, PR 10%, Tis NX stage 0 ?  ?02/18/2018 Genetic Testing  ? Negative.  Genes tested include: ATM, BRCA1, BRCA2, CDH1, CHEK2, PALB2, PTEN, STK11 and TP53; APC, ATM, AXIN2, BARD1, BMPR1A, BRCA1, BRCA2, BRIP1, CDH1, CDKN2A (p14ARF), CDKN2A (p16INK4a), CKD4, CHEK2, CTNNA1, DICER1, EPCAM (Deletion/duplication testing only), GREM1 (promoter region deletion/duplication testing only), KIT, MEN1, MLH1, MSH2, MSH3, MSH6, MUTYH, NBN, NF1, NHTL1, PALB2, PDGFRA, PMS2, POLD1, POLE, PTEN, RAD50, RAD51C, RAD51D, SDHB, SDHC, SDHD, SMAD4, SMARCA4. STK11, TP53, TSC1, TSC2, and VHL.  The following genes were evaluated for sequence changes only: SDHA and HOXB13 c.251G>A variant only. ?  ?03/20/2018 Surgery  ? Right lumpectomy: Scattered microscopic foci of DCIS intermediate grade, margins negative, ER 90%, PR 10%, Tis NX stage 0 ?  ?04/01/2018 Cancer Staging  ? Staging form: Breast, AJCC 8th Edition ?- Pathologic: Stage 0 (pTis (DCIS), pN0, cM0, ER+, PR+) - Signed by Gardenia Phlegm, NP on 04/01/2018 ? ?  ?04/28/2018 - 05/26/2018 Radiation Therapy  ? Adjuvant radiation 1. Right breast;  15 fractions of 2.67 Gy for a total of 40.05 Gy 2. Boost; 5 fractions of 2 Gy for a total of 10 Gy ?  ?  ?05/2018 -  Anti-estrogen oral therapy  ? Tamoxifen daily ?  ?Malignant neoplasm of upper-outer quadrant of left breast in female, estrogen receptor positive ([facility])  ?01/19/2021 Initial Diagnosis  ? Left breast biopsy: 2:00 and 4:00: Grade 3 IDC ER 40% weak, PR 0%, Ki-67 40%, HER2 2+ equivocal by IHC, FISH negative ?Lymph node biopsy: Benign ?  ?02/12/2021 Breast MRI  ? Breast MRI 02/12/2021: 4 suspicious masses within the left breast consistent with multifocal, multicentric disease 2 of the 4 masses have been biopsied (2 cm, 1.3 cm, 1.2 cm, 1.7 cm) for mildly prominent level 1 lymph nodes are identified ?  ?03/14/2021 -  Chemotherapy  ? Patient is on Treatment Plan : BREAST ADJUVANT DOSE DENSE AC q14d / PACLitaxel q7d  ?   ? ? ?CHIEF COMPLIANT: Left breast cancer  ? ?INTERVAL HISTORY: Jasmine Maddox is a  61 y.o. with above-mentioned history of left breast cancer who is currently on chemotherapy with Adriamycin and Cytoxan. She presents to the clinic today for treatment. ?She states that the back spasm wasn't bad this time. She also states that her anxiety has calm down. She also said bowels are under control. She complains of taste not doing good but appetite is doing fine. ? ?ALLERGIES:  has No Known Allergies. ? ?MEDICATIONS:  ?Current Outpatient Medications  ?Medication Sig Dispense Refill  ? ALPRAZolam (XANAX) 0.25 MG tablet Take 1 tablet (0.25 mg total) by mouth 2 (two) times daily as needed for anxiety. 30 tablet 1  ? chlorhexidine (PERIDEX) 0.12 %  solution 15 mLs 2 (two) times daily.    ? dexamethasone (DECADRON) 4 MG tablet Take 1 tablet (4 mg total) by mouth daily. Take 1 tablet day after chemo and 1 tablet 2 days after chemo with food 8 tablet 0  ? HYDROcodone-acetaminophen (NORCO/VICODIN) 5-325 MG tablet Take 1 tablet by mouth every 8 (eight) hours as needed for moderate pain. 30 tablet 0  ?  lidocaine-prilocaine (EMLA) cream Apply to affected area once 30 g 3  ? lisinopril (PRINIVIL,ZESTRIL) 20 MG tablet Take 20 mg by mouth daily.    ? mupirocin ointment (BACTROBAN) 2 % Apply 1 application. topically daily. 22 g 0  ? ondansetron (ZOFRAN) 8 MG tablet Take 1 tablet (8 mg total) by mouth 2 (two) times daily as needed. Start on the third day after chemotherapy. 30 tablet 1  ? prochlorperazine (COMPAZINE) 10 MG tablet Take 1 tablet (10 mg total) by mouth every 6 (six) hours as needed (Nausea or vomiting). 30 tablet 1  ? tamoxifen (NOLVADEX) 20 MG tablet Take 20 mg by mouth daily.    ? ?No current facility-administered medications for this visit.  ? ? ?PHYSICAL EXAMINATION: ?ECOG PERFORMANCE STATUS: 1 - Symptomatic but completely ambulatory ? ?Vitals:  ? 04/11/21 0947  ?BP: (!) 156/79  ?Pulse: 75  ?Resp: 18  ?Temp: 97.8 ?F (36.6 ?C)  ?SpO2: 100%  ? ?Filed Weights  ? 04/11/21 0947  ?Weight: 146 lb 3.2 oz (66.3 kg)  ?  ? ?LABORATORY DATA:  ?I have reviewed the data as listed ? ?  Latest Ref Rng & Units 04/04/2021  ?  9:07 AM 03/28/2021  ? 11:20 AM 03/23/2021  ? 10:13 AM  ?CMP  ?Glucose 70 - 99 mg/dL 121   93   114    ?BUN 6 - 20 mg/dL _0 ?Creatinine 0.44 - 1.00 mg/dL 0.66   0.75   0.68    ?Sodium 135 - 145 mmol/L 139   140   138    ?Potassium 3.5 - 5.1 mmol/L 3.8   4.2   3.8    ?Chloride 98 - 111 mmol/L 103   104   105    ?CO2 22 - 32 mmol/L 30   32   29    ?Calcium 8.9 - 10.3 mg/dL 9.2   9.4   8.7    ?Total Protein 6.5 - 8.1 g/dL 6.9   6.8   6.4    ?Total Bilirubin 0.3 - 1.2 mg/dL 0.4   0.3   0.4    ?Alkaline Phos 38 - 126 U/L 93   99   66    ?AST 15 - 41 U/L _1 ?ALT 0 - 44 U/L 26   51   57    ? ? ?Lab Results  ?Component Value Date  ? WBC 9.6 04/11/2021  ? HGB 10.9 (L) 04/11/2021  ? HCT 32.5 (L) 04/11/2021  ? MCV 92.1 04/11/2021  ? PLT 206 04/11/2021  ? NEUTROABS 6.8 04/11/2021  ? ? ?ASSESSMENT & PLAN:  ?Ductal carcinoma in situ (DCIS) of right breast ?01/19/2021:Left breast biopsy: 2:00  and 4:00: Grade 3 IDC ER 40% weak, PR 0%, Ki-67 40%, HER2 2+ equivocal by IHC, FISH negative Lymph node biopsy: Benign ?  ?Breast MRI 02/12/2021: 4 suspicious masses within the left breast consistent with multifocal, multicentric disease 2 of the 4 masses have been biopsied (2 cm, 1.3 cm, 1.2  cm, 1.7 cm) for mildly prominent level 1 lymph nodes are identified ?  ?Treatment plan: ?1.  Neoadjuvant chemotherapy with dose dense Adriamycin Cytoxan followed by Taxol   ?2. mastectomy with sentinel lymph node biopsy ?3.  Adjuvant radiation ?4.  Followed by adjuvant antiestrogen therapy ?------------------------------------------------------------------------------------------ ?Current Treatment: Cycle 3 dose dense Adriamycin and Cytoxan ?Chemo Toxicities: ?Severe neutropenia at nadir count: ANC 0.1: We reduced the dosage of cycle 2. we will keep the dosage at the reduced levels. ?Severe anxiety:  Much improved. ?Mild nausea: Resolved ?Chemotherapy-induced anemia: Monitoring closely today's hemoglobin is 10.9 ?Back spasms related to Neulasta: No longer a problem ?  ?RTC in 2 weeks for cycle 4 ? ? ? ?No orders of the defined types were placed in this encounter. ? ?The patient has a good understanding of the overall plan. she agrees with it. she will call with any problems that may develop before the next visit here. ?Total time spent: 30 mins including face to face time and time spent for planning, charting and co-ordination of care ? ? Harriette Ohara, MD ?04/11/21 ? ? ? I Gardiner Coins am scribing for Dr. Lindi Adie ? ?I have reviewed the above documentation for accuracy and completeness, and I agree with the above. ?  ?

## 2021-04-11 ENCOUNTER — Other Ambulatory Visit: Payer: Self-pay

## 2021-04-11 ENCOUNTER — Inpatient Hospital Stay: Payer: BC Managed Care – PPO | Admitting: Hematology and Oncology

## 2021-04-11 ENCOUNTER — Inpatient Hospital Stay: Payer: BC Managed Care – PPO

## 2021-04-11 ENCOUNTER — Inpatient Hospital Stay (HOSPITAL_BASED_OUTPATIENT_CLINIC_OR_DEPARTMENT_OTHER): Payer: BC Managed Care – PPO | Admitting: Hematology and Oncology

## 2021-04-11 DIAGNOSIS — C50412 Malignant neoplasm of upper-outer quadrant of left female breast: Secondary | ICD-10-CM | POA: Diagnosis not present

## 2021-04-11 DIAGNOSIS — Z5111 Encounter for antineoplastic chemotherapy: Secondary | ICD-10-CM | POA: Diagnosis not present

## 2021-04-11 DIAGNOSIS — D0511 Intraductal carcinoma in situ of right breast: Secondary | ICD-10-CM

## 2021-04-11 DIAGNOSIS — M549 Dorsalgia, unspecified: Secondary | ICD-10-CM | POA: Diagnosis not present

## 2021-04-11 DIAGNOSIS — Z5189 Encounter for other specified aftercare: Secondary | ICD-10-CM | POA: Diagnosis not present

## 2021-04-11 DIAGNOSIS — Z170 Estrogen receptor positive status [ER+]: Secondary | ICD-10-CM | POA: Diagnosis not present

## 2021-04-11 LAB — CBC WITH DIFFERENTIAL (CANCER CENTER ONLY)
Abs Immature Granulocytes: 0.46 10*3/uL — ABNORMAL HIGH (ref 0.00–0.07)
Basophils Absolute: 0.1 10*3/uL (ref 0.0–0.1)
Basophils Relative: 1 %
Eosinophils Absolute: 0 10*3/uL (ref 0.0–0.5)
Eosinophils Relative: 0 %
HCT: 32.5 % — ABNORMAL LOW (ref 36.0–46.0)
Hemoglobin: 10.9 g/dL — ABNORMAL LOW (ref 12.0–15.0)
Immature Granulocytes: 5 %
Lymphocytes Relative: 12 %
Lymphs Abs: 1.2 10*3/uL (ref 0.7–4.0)
MCH: 30.9 pg (ref 26.0–34.0)
MCHC: 33.5 g/dL (ref 30.0–36.0)
MCV: 92.1 fL (ref 80.0–100.0)
Monocytes Absolute: 1 10*3/uL (ref 0.1–1.0)
Monocytes Relative: 11 %
Neutro Abs: 6.8 10*3/uL (ref 1.7–7.7)
Neutrophils Relative %: 71 %
Platelet Count: 206 10*3/uL (ref 150–400)
RBC: 3.53 MIL/uL — ABNORMAL LOW (ref 3.87–5.11)
RDW: 13.8 % (ref 11.5–15.5)
WBC Count: 9.6 10*3/uL (ref 4.0–10.5)
nRBC: 0.5 % — ABNORMAL HIGH (ref 0.0–0.2)

## 2021-04-11 LAB — CMP (CANCER CENTER ONLY)
ALT: 20 U/L (ref 0–44)
AST: 14 U/L — ABNORMAL LOW (ref 15–41)
Albumin: 3.9 g/dL (ref 3.5–5.0)
Alkaline Phosphatase: 90 U/L (ref 38–126)
Anion gap: 4 — ABNORMAL LOW (ref 5–15)
BUN: 15 mg/dL (ref 6–20)
CO2: 30 mmol/L (ref 22–32)
Calcium: 9.1 mg/dL (ref 8.9–10.3)
Chloride: 106 mmol/L (ref 98–111)
Creatinine: 0.8 mg/dL (ref 0.44–1.00)
GFR, Estimated: >60 mL/min (ref >60)
Glucose, Bld: 97 mg/dL (ref 70–99)
Potassium: 4.3 mmol/L (ref 3.5–5.1)
Sodium: 140 mmol/L (ref 135–145)
Total Bilirubin: 0.2 mg/dL — ABNORMAL LOW (ref 0.3–1.2)
Total Protein: 6.8 g/dL (ref 6.5–8.1)

## 2021-04-11 MED ORDER — SODIUM CHLORIDE 0.9% FLUSH
10.0000 mL | INTRAVENOUS | Status: DC | PRN
  Administered 2021-04-11: 10 mL

## 2021-04-11 MED ORDER — DOXORUBICIN HCL CHEMO IV INJECTION 2 MG/ML
50.0000 mg/m2 | Freq: Once | INTRAVENOUS | Status: AC
  Administered 2021-04-11: 86 mg via INTRAVENOUS
  Filled 2021-04-11: qty 43

## 2021-04-11 MED ORDER — SODIUM CHLORIDE 0.9 % IV SOLN
500.0000 mg/m2 | Freq: Once | INTRAVENOUS | Status: AC
  Administered 2021-04-11: 860 mg via INTRAVENOUS
  Filled 2021-04-11: qty 43

## 2021-04-11 MED ORDER — SODIUM CHLORIDE 0.9% FLUSH
10.0000 mL | Freq: Once | INTRAVENOUS | Status: AC
  Administered 2021-04-11: 10 mL

## 2021-04-11 MED ORDER — SODIUM CHLORIDE 0.9 % IV SOLN
10.0000 mg | Freq: Once | INTRAVENOUS | Status: AC
  Administered 2021-04-11: 10 mg via INTRAVENOUS
  Filled 2021-04-11: qty 10

## 2021-04-11 MED ORDER — PALONOSETRON HCL INJECTION 0.25 MG/5ML
0.2500 mg | Freq: Once | INTRAVENOUS | Status: AC
  Administered 2021-04-11: 0.25 mg via INTRAVENOUS
  Filled 2021-04-11: qty 5

## 2021-04-11 MED ORDER — HEPARIN SOD (PORK) LOCK FLUSH 100 UNIT/ML IV SOLN
500.0000 [IU] | Freq: Once | INTRAVENOUS | Status: AC | PRN
  Administered 2021-04-11: 500 [IU]

## 2021-04-11 MED ORDER — SODIUM CHLORIDE 0.9 % IV SOLN: Freq: Once | INTRAVENOUS | Status: AC

## 2021-04-11 MED ORDER — SODIUM CHLORIDE 0.9 % IV SOLN
150.0000 mg | Freq: Once | INTRAVENOUS | Status: AC
  Administered 2021-04-11: 150 mg via INTRAVENOUS
  Filled 2021-04-11: qty 150

## 2021-04-11 NOTE — Patient Instructions (Signed)

## 2021-04-11 NOTE — Assessment & Plan Note (Addendum)
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, PR 0%, Ki-67 40%, HER2 2+ equivocal by IHC, FISH negative Lymph node biopsy: Benign ?? ?Breast MRI 02/12/2021: 4 suspicious masses within the left breast consistent with multifocal, multicentric disease 2 of the 4 masses have been biopsied (2 cm, 1.3 cm, 1.2 cm, 1.7 cm) for mildly prominent level 1 lymph nodes are identified ?? ?Treatment plan: ?1.??Neoadjuvant chemotherapy with dose dense Adriamycin Cytoxan followed by Taxol ? ?2.?mastectomy?with sentinel lymph node biopsy ?3.??Adjuvant radiation ?4.??Followed by adjuvant antiestrogen therapy ?------------------------------------------------------------------------------------------ ?Current Treatment: Cycle 3 dose dense Adriamycin and Cytoxan ?Chemo Toxicities: ?1. Severe neutropenia at nadir count: ANC 0.1: We reduced the dosage of cycle 2. we will keep the dosage at the reduced levels. ?2. Severe anxiety:  Much improved. ?3. Mild nausea: Resolved ?4. Chemotherapy-induced anemia: Monitoring closely today's hemoglobin is 10.9 ?5. Back spasms related to Neulasta: No longer a problem ?? ?RTC in 2 weeks for cycle 4 ?

## 2021-04-11 NOTE — Patient Instructions (Signed)
[facility]  Discharge Instructions: ?Thank you for choosing [facility] to provide your oncology and hematology care.  ? ?If you have a lab appointment with the [facility], please go directly to the [facility] and check in at the registration area. ?  ?Wear comfortable clothing and clothing appropriate for easy access to any Portacath or PICC line.  ? ?We strive to give you quality time with your provider. You may need to reschedule your appointment if you arrive late (15 or more minutes).  Arriving late affects you and other patients whose appointments are after yours.  Also, if you miss three or more appointments without notifying the office, you may be dismissed from the clinic at the provider?s discretion.    ?  ?For prescription refill requests, have your pharmacy contact our office and allow 72 hours for refills to be completed.   ? ?Today you received the following chemotherapy and/or immunotherapy agents: Cytoxan, Doxorubicin.    ?  ?To help prevent nausea and vomiting after your treatment, we encourage you to take your nausea medication as directed. ? ?BELOW ARE SYMPTOMS THAT SHOULD BE REPORTED IMMEDIATELY: ?*FEVER GREATER THAN 100.4 F (38 ?C) OR HIGHER ?*CHILLS OR SWEATING ?*NAUSEA AND VOMITING THAT IS NOT CONTROLLED WITH YOUR NAUSEA MEDICATION ?*UNUSUAL SHORTNESS OF BREATH ?*UNUSUAL BRUISING OR BLEEDING ?*URINARY PROBLEMS (pain or burning when urinating, or frequent urination) ?*BOWEL PROBLEMS (unusual diarrhea, constipation, pain near the anus) ?TENDERNESS IN MOUTH AND THROAT WITH OR WITHOUT PRESENCE OF ULCERS (sore throat, sores in mouth, or a toothache) ?UNUSUAL RASH, SWELLING OR PAIN  ?UNUSUAL VAGINAL DISCHARGE OR ITCHING  ? ?Items with * indicate a potential emergency and should be followed up as soon as possible or go to the Emergency Department if any problems should occur. ? ?Please show the CHEMOTHERAPY ALERT CARD or IMMUNOTHERAPY ALERT CARD at  check-in to the Emergency Department and triage nurse. ? ?Should you have questions after your visit or need to cancel or reschedule your appointment, please contact [facility]  Dept: [phone]  and follow the prompts.  Office hours are 8:00 a.m. to 4:30 p.m. Monday - Friday. Please note that voicemails left after 4:00 p.m. may not be returned until the following business day.  We are closed weekends and major holidays. You have access to a nurse at all times for urgent questions. Please call the main number to the clinic Dept: [phone] and follow the prompts. ? ? ?For any non-urgent questions, you may also contact your provider using MyChart. We now offer e-Visits for anyone 24 and older to request care online for non-urgent symptoms. For details visit [email or web address]. ?  ?Also download the MyChart app! Go to the app store, search "MyChart", open the app, select [facility], and log in with your MyChart username and password. ? ?Due to Covid, a mask is required upon entering the hospital/clinic. If you do not have a mask, one will be given to you upon arrival. For doctor visits, patients may have 1 support person aged 82 or older with them. For treatment visits, patients cannot have anyone with them due to current Covid guidelines and our immunocompromised population.  ? ?

## 2021-04-13 ENCOUNTER — Other Ambulatory Visit: Payer: Self-pay

## 2021-04-13 ENCOUNTER — Inpatient Hospital Stay: Payer: BC Managed Care – PPO

## 2021-04-13 VITALS — BP 137/89 | HR 77 | Temp 98.3°F | Resp 18

## 2021-04-13 DIAGNOSIS — C50412 Malignant neoplasm of upper-outer quadrant of left female breast: Secondary | ICD-10-CM | POA: Diagnosis not present

## 2021-04-13 DIAGNOSIS — Z5111 Encounter for antineoplastic chemotherapy: Secondary | ICD-10-CM | POA: Diagnosis not present

## 2021-04-13 DIAGNOSIS — D0511 Intraductal carcinoma in situ of right breast: Secondary | ICD-10-CM | POA: Diagnosis not present

## 2021-04-13 DIAGNOSIS — M549 Dorsalgia, unspecified: Secondary | ICD-10-CM | POA: Diagnosis not present

## 2021-04-13 DIAGNOSIS — Z5189 Encounter for other specified aftercare: Secondary | ICD-10-CM | POA: Diagnosis not present

## 2021-04-13 DIAGNOSIS — Z170 Estrogen receptor positive status [ER+]: Secondary | ICD-10-CM | POA: Diagnosis not present

## 2021-04-13 MED ORDER — PEGFILGRASTIM-CBQV 6 MG/0.6ML ~~LOC~~ SOSY
6.0000 mg | PREFILLED_SYRINGE | Freq: Once | SUBCUTANEOUS | Status: AC
  Administered 2021-04-13: 6 mg via SUBCUTANEOUS
  Filled 2021-04-13: qty 0.6

## 2021-04-24 ENCOUNTER — Ambulatory Visit: Payer: BC Managed Care – PPO

## 2021-04-24 ENCOUNTER — Ambulatory Visit: Payer: BC Managed Care – PPO | Admitting: Hematology and Oncology

## 2021-04-24 ENCOUNTER — Other Ambulatory Visit: Payer: BC Managed Care – PPO

## 2021-04-24 NOTE — Progress Notes (Signed)
? ?Patient Care Team: ?[name], PA as PCP - General (Family Medicine) ?[name], MD as Consulting Physician (General Surgery) ?[name], MD as Consulting Physician (Hematology and Oncology) ?[name], MD as Consulting Physician (Radiation Oncology) ?Rockwell Germany, RN as Oncology Nurse Navigator ?[name], RN as Oncology Nurse Navigator ? ?DIAGNOSIS:  ?Encounter Diagnosis  ?Name Primary?  ? Malignant neoplasm of upper-outer quadrant of left breast in female, estrogen receptor positive ([place])   ? ? ?SUMMARY OF ONCOLOGIC HISTORY: ?Oncology History  ?Ductal carcinoma in situ (DCIS) of right breast  ?01/30/2018 Mammogram  ? Diagnostic Mammogram  ?0.4cm cluster of calcifications, 12 o'clock position, 4cm fn, right breast ?  ?02/12/2018 Initial Diagnosis  ? Screening detected right breast calcifications 4 mm size UOQ 11:30 position stereotactic biopsy revealed high-grade DCIS ER 90%, PR 10%, Tis NX stage 0 ?  ?02/18/2018 Genetic Testing  ? Negative.  Genes tested include: ATM, BRCA1, BRCA2, CDH1, CHEK2, PALB2, PTEN, STK11 and TP53; APC, ATM, AXIN2, BARD1, BMPR1A, BRCA1, BRCA2, BRIP1, CDH1, CDKN2A (p14ARF), CDKN2A (p16INK4a), CKD4, CHEK2, CTNNA1, DICER1, EPCAM (Deletion/duplication testing only), GREM1 (promoter region deletion/duplication testing only), KIT, MEN1, MLH1, MSH2, MSH3, MSH6, MUTYH, NBN, NF1, NHTL1, PALB2, PDGFRA, PMS2, POLD1, POLE, PTEN, RAD50, RAD51C, RAD51D, SDHB, SDHC, SDHD, SMAD4, SMARCA4. STK11, TP53, TSC1, TSC2, and VHL.  The following genes were evaluated for sequence changes only: SDHA and HOXB13 c.251G>A variant only. ?  ?03/20/2018 Surgery  ? Right lumpectomy: Scattered microscopic foci of DCIS intermediate grade, margins negative, ER 90%, PR 10%, Tis NX stage 0 ?  ?04/01/2018 Cancer Staging  ? Staging form: Breast, AJCC 8th Edition ?- Pathologic: Stage 0 (pTis (DCIS), pN0, cM0, ER+, PR+) - Signed by Gardenia Phlegm, NP on 04/01/2018 ? ?  ?04/28/2018 - 05/26/2018  Radiation Therapy  ? Adjuvant radiation 1. Right breast; 15 fractions of 2.67 Gy for a total of 40.05 Gy 2. Boost; 5 fractions of 2 Gy for a total of 10 Gy ?  ?  ?05/2018 -  Anti-estrogen oral therapy  ? Tamoxifen daily ?  ?Malignant neoplasm of upper-outer quadrant of left breast in female, estrogen receptor positive ([place])  ?01/19/2021 Initial Diagnosis  ? Left breast biopsy: 2:00 and 4:00: Grade 3 IDC ER 40% weak, PR 0%, Ki-67 40%, HER2 2+ equivocal by IHC, FISH negative ?Lymph node biopsy: Benign ?  ?02/12/2021 Breast MRI  ? Breast MRI 02/12/2021: 4 suspicious masses within the left breast consistent with multifocal, multicentric disease 2 of the 4 masses have been biopsied (2 cm, 1.3 cm, 1.2 cm, 1.7 cm) for mildly prominent level 1 lymph nodes are identified ?  ?03/14/2021 -  Chemotherapy  ? Patient is on Treatment Plan : BREAST ADJUVANT DOSE DENSE AC q14d / PACLitaxel q7d  ?   ? ? ?CHIEF COMPLIANT: Cycle 4 AC ? ?INTERVAL HISTORY: Jasmine Maddox is a  61 y.o. with above-mentioned history of left breast cancer who is currently on chemotherapy with Adriamycin and Cytoxan. Jasmine Maddox presents to the clinic today for treatment. ?Jasmine Maddox tolerated the AC. Jasmine Maddox denies mouth sores, nausea, and bowels good. ? ?ALLERGIES:  has No Known Allergies. ? ?MEDICATIONS:  ?Current Outpatient Medications  ?Medication Sig Dispense Refill  ? ALPRAZolam (XANAX) 0.25 MG tablet Take 1 tablet (0.25 mg total) by mouth 2 (two) times daily as needed for anxiety. 30 tablet 1  ? chlorhexidine (PERIDEX) 0.12 % solution 15 mLs 2 (two) times daily.    ? dexamethasone (DECADRON) 4 MG tablet Take 1 tablet (4  mg total) by mouth daily. Take 1 tablet day after chemo and 1 tablet 2 days after chemo with food 8 tablet 0  ? HYDROcodone-acetaminophen (NORCO/VICODIN) 5-325 MG tablet Take 1 tablet by mouth every 8 (eight) hours as needed for moderate pain. 30 tablet 0  ? lidocaine-prilocaine (EMLA) cream Apply to affected area once 30 g 3  ? lisinopril (PRINIVIL,ZESTRIL)  20 MG tablet Take 20 mg by mouth daily.    ? mupirocin ointment (BACTROBAN) 2 % Apply 1 application. topically daily. 22 g 0  ? ondansetron (ZOFRAN) 8 MG tablet Take 1 tablet (8 mg total) by mouth 2 (two) times daily as needed. Start on the third day after chemotherapy. 30 tablet 1  ? prochlorperazine (COMPAZINE) 10 MG tablet Take 1 tablet (10 mg total) by mouth every 6 (six) hours as needed (Nausea or vomiting). 30 tablet 1  ? tamoxifen (NOLVADEX) 20 MG tablet Take 20 mg by mouth daily.    ? ?No current facility-administered medications for this visit.  ? ? ?PHYSICAL EXAMINATION: ?ECOG PERFORMANCE STATUS: 1 - Symptomatic but completely ambulatory ? ?Vitals:  ? 04/25/21 1156  ?BP: (!) 143/82  ?Pulse: 74  ?Resp: 18  ?Temp: (!) 97.5 ?F (36.4 ?C)  ?SpO2: 100%  ? ?Filed Weights  ? 04/25/21 1156  ?Weight: 145 lb 11.2 oz (66.1 kg)  ? ?  ? ?LABORATORY DATA:  ?I have reviewed the data as listed ? ?  Latest Ref Rng & Units 04/11/2021  ?  9:36 AM 04/04/2021  ?  9:07 AM 03/28/2021  ? 11:20 AM  ?CMP  ?Glucose 70 - 99 mg/dL 97   121   93    ?BUN 6 - 20 mg/dL '15   13   11    ' ?Creatinine 0.44 - 1.00 mg/dL 0.80   0.66   0.75    ?Sodium 135 - 145 mmol/L 140   139   140    ?Potassium 3.5 - 5.1 mmol/L 4.3   3.8   4.2    ?Chloride 98 - 111 mmol/L 106   103   104    ?CO2 22 - 32 mmol/L 30   30   32    ?Calcium 8.9 - 10.3 mg/dL 9.1   9.2   9.4    ?Total Protein 6.5 - 8.1 g/dL 6.8   6.9   6.8    ?Total Bilirubin 0.3 - 1.2 mg/dL 0.2   0.4   0.3    ?Alkaline Phos 38 - 126 U/L 90   93   99    ?AST 15 - 41 U/L '14   17   24    ' ?ALT 0 - 44 U/L 20   26   51    ? ? ?Lab Results  ?Component Value Date  ? WBC 14.6 (H) 04/25/2021  ? HGB 10.2 (L) 04/25/2021  ? HCT 31.0 (L) 04/25/2021  ? MCV 93.7 04/25/2021  ? PLT 178 04/25/2021  ? NEUTROABS 11.6 (H) 04/25/2021  ? ? ?ASSESSMENT & PLAN:  ?Malignant neoplasm of upper-outer quadrant of left breast in female, estrogen receptor positive ([place]) ?01/19/2021:Left breast biopsy: 2:00 and 4:00: Grade 3 IDC ER 40%  weak, PR 0%, Ki-67 40%, HER2 2+ equivocal by IHC, FISH negative Lymph node biopsy: Benign ?  ?Breast MRI 02/12/2021: 4 suspicious masses within the left breast consistent with multifocal, multicentric disease 2 of the 4 masses have been biopsied (2 cm, 1.3 cm, 1.2 cm, 1.7 cm) for mildly prominent level 1 lymph  nodes are identified ?  ?Treatment plan: ?1.  Neoadjuvant chemotherapy with dose dense Adriamycin Cytoxan followed by Taxol   ?2. mastectomy with sentinel lymph node biopsy ?3.  Adjuvant radiation ?4.  Followed by adjuvant antiestrogen therapy ?------------------------------------------------------------------------------------------ ?Current Treatment: Cycle 4 dose dense Adriamycin and Cytoxan ?Chemo Toxicities: ?Severe anxiety:  Much improved. ?Mild nausea: Resolved ?Chemotherapy-induced anemia: Monitoring closely today's hemoglobin is 10.2 ?Back spasms related to Neulasta: No longer a problem ?  ?Jasmine Maddox is still working full-time and is able to handle work pressures. ? ?RTC in 2 weeks for cycle 1 Taxol ?  ? ?No orders of the defined types were placed in this encounter. ? ?The patient has a good understanding of the overall plan. Jasmine Maddox agrees with it. Jasmine Maddox will call with any problems that may develop before the next visit here. ?Total time spent: 30 mins including face to face time and time spent for planning, charting and co-ordination of care ? ? Harriette Ohara, MD ?04/25/21 ? ? ? I Gardiner Coins am scribing for Dr. Lindi Adie ? ?I have reviewed the above documentation for accuracy and completeness, and I agree with the above. ?. ?

## 2021-04-25 ENCOUNTER — Inpatient Hospital Stay: Payer: BC Managed Care – PPO

## 2021-04-25 ENCOUNTER — Other Ambulatory Visit: Payer: Self-pay

## 2021-04-25 ENCOUNTER — Inpatient Hospital Stay (HOSPITAL_BASED_OUTPATIENT_CLINIC_OR_DEPARTMENT_OTHER): Payer: BC Managed Care – PPO | Admitting: Hematology and Oncology

## 2021-04-25 ENCOUNTER — Inpatient Hospital Stay: Payer: BC Managed Care – PPO | Attending: Hematology and Oncology

## 2021-04-25 DIAGNOSIS — C50412 Malignant neoplasm of upper-outer quadrant of left female breast: Secondary | ICD-10-CM

## 2021-04-25 DIAGNOSIS — Z5189 Encounter for other specified aftercare: Secondary | ICD-10-CM | POA: Diagnosis not present

## 2021-04-25 DIAGNOSIS — Z5111 Encounter for antineoplastic chemotherapy: Secondary | ICD-10-CM | POA: Insufficient documentation

## 2021-04-25 DIAGNOSIS — Z170 Estrogen receptor positive status [ER+]: Secondary | ICD-10-CM

## 2021-04-25 LAB — CBC WITH DIFFERENTIAL (CANCER CENTER ONLY)
Abs Immature Granulocytes: 0.43 10*3/uL — ABNORMAL HIGH (ref 0.00–0.07)
Basophils Absolute: 0.1 10*3/uL (ref 0.0–0.1)
Basophils Relative: 0 %
Eosinophils Absolute: 0 10*3/uL (ref 0.0–0.5)
Eosinophils Relative: 0 %
HCT: 31 % — ABNORMAL LOW (ref 36.0–46.0)
Hemoglobin: 10.2 g/dL — ABNORMAL LOW (ref 12.0–15.0)
Immature Granulocytes: 3 %
Lymphocytes Relative: 8 %
Lymphs Abs: 1.1 10*3/uL (ref 0.7–4.0)
MCH: 30.8 pg (ref 26.0–34.0)
MCHC: 32.9 g/dL (ref 30.0–36.0)
MCV: 93.7 fL (ref 80.0–100.0)
Monocytes Absolute: 1.4 10*3/uL — ABNORMAL HIGH (ref 0.1–1.0)
Monocytes Relative: 10 %
Neutro Abs: 11.6 10*3/uL — ABNORMAL HIGH (ref 1.7–7.7)
Neutrophils Relative %: 79 %
Platelet Count: 178 10*3/uL (ref 150–400)
RBC: 3.31 MIL/uL — ABNORMAL LOW (ref 3.87–5.11)
RDW: 15.5 % (ref 11.5–15.5)
WBC Count: 14.6 10*3/uL — ABNORMAL HIGH (ref 4.0–10.5)
nRBC: 0.3 % — ABNORMAL HIGH (ref 0.0–0.2)

## 2021-04-25 LAB — CMP (CANCER CENTER ONLY)
ALT: 37 U/L (ref 0–44)
AST: 19 U/L (ref 15–41)
Albumin: 4 g/dL (ref 3.5–5.0)
Alkaline Phosphatase: 98 U/L (ref 38–126)
Anion gap: 3 — ABNORMAL LOW (ref 5–15)
BUN: 12 mg/dL (ref 6–20)
CO2: 31 mmol/L (ref 22–32)
Calcium: 9 mg/dL (ref 8.9–10.3)
Chloride: 107 mmol/L (ref 98–111)
Creatinine: 0.67 mg/dL (ref 0.44–1.00)
GFR, Estimated: >60 mL/min (ref >60)
Glucose, Bld: 86 mg/dL (ref 70–99)
Potassium: 3.8 mmol/L (ref 3.5–5.1)
Sodium: 141 mmol/L (ref 135–145)
Total Bilirubin: 0.3 mg/dL (ref 0.3–1.2)
Total Protein: 6.9 g/dL (ref 6.5–8.1)

## 2021-04-25 MED ORDER — PALONOSETRON HCL INJECTION 0.25 MG/5ML
0.2500 mg | Freq: Once | INTRAVENOUS | Status: AC
  Administered 2021-04-25: 0.25 mg via INTRAVENOUS
  Filled 2021-04-25: qty 5

## 2021-04-25 MED ORDER — DOXORUBICIN HCL CHEMO IV INJECTION 2 MG/ML
50.0000 mg/m2 | Freq: Once | INTRAVENOUS | Status: AC
  Administered 2021-04-25: 86 mg via INTRAVENOUS
  Filled 2021-04-25: qty 43

## 2021-04-25 MED ORDER — SODIUM CHLORIDE 0.9 % IV SOLN
10.0000 mg | Freq: Once | INTRAVENOUS | Status: AC
  Administered 2021-04-25: 10 mg via INTRAVENOUS
  Filled 2021-04-25: qty 10

## 2021-04-25 MED ORDER — SODIUM CHLORIDE 0.9% FLUSH
10.0000 mL | Freq: Once | INTRAVENOUS | Status: AC
  Administered 2021-04-25: 10 mL

## 2021-04-25 MED ORDER — SODIUM CHLORIDE 0.9 % IV SOLN
150.0000 mg | Freq: Once | INTRAVENOUS | Status: AC
  Administered 2021-04-25: 150 mg via INTRAVENOUS
  Filled 2021-04-25: qty 150

## 2021-04-25 MED ORDER — SODIUM CHLORIDE 0.9 % IV SOLN
500.0000 mg/m2 | Freq: Once | INTRAVENOUS | Status: AC
  Administered 2021-04-25: 860 mg via INTRAVENOUS
  Filled 2021-04-25: qty 43

## 2021-04-25 MED ORDER — SODIUM CHLORIDE 0.9 % IV SOLN: Freq: Once | INTRAVENOUS | Status: AC

## 2021-04-25 NOTE — Assessment & Plan Note (Signed)
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, PR 0%, Ki-67 40%, HER2 2+ equivocal by IHC, FISH negative Lymph node biopsy: Benign ?? ?Breast MRI 02/12/2021: 4 suspicious masses within the left breast consistent with multifocal, multicentric disease 2 of the 4 masses have been biopsied (2 cm, 1.3 cm, 1.2 cm, 1.7 cm) for mildly prominent level 1 lymph nodes are identified ?? ?Treatment plan: ?1.??Neoadjuvant chemotherapy with dose dense Adriamycin Cytoxan followed by Taxol ? ?2.?mastectomy?with sentinel lymph node biopsy ?3.??Adjuvant radiation ?4.??Followed by adjuvant antiestrogen therapy ?------------------------------------------------------------------------------------------ ?Current Treatment: Cycle?4 dose dense Adriamycin and Cytoxan ?Chemo Toxicities: ?1. Severe neutropenia at nadir count: ANC 0.1: We reduced?the dosage of cycle 2. we will keep the dosage at the reduced levels. ?2. Severe anxiety:? Much improved. ?3. Mild nausea:?Resolved ?4. Chemotherapy-induced anemia: Monitoring closely today's hemoglobin is 10.9 ?5. Back spasms related to Neulasta: No longer a problem ?? ?RTC in?2?weeks?for cycle 1 Taxol ?? ?

## 2021-04-26 ENCOUNTER — Telehealth: Payer: Self-pay | Admitting: Hematology and Oncology

## 2021-04-26 NOTE — Telephone Encounter (Signed)
Scheduled appointment per 4/5 los. Patient is aware. ?

## 2021-04-27 ENCOUNTER — Other Ambulatory Visit: Payer: Self-pay

## 2021-04-27 ENCOUNTER — Inpatient Hospital Stay: Payer: BC Managed Care – PPO

## 2021-04-27 VITALS — BP 154/74 | HR 79 | Temp 98.4°F | Resp 18

## 2021-04-27 DIAGNOSIS — Z5189 Encounter for other specified aftercare: Secondary | ICD-10-CM | POA: Diagnosis not present

## 2021-04-27 DIAGNOSIS — Z5111 Encounter for antineoplastic chemotherapy: Secondary | ICD-10-CM | POA: Diagnosis not present

## 2021-04-27 DIAGNOSIS — Z170 Estrogen receptor positive status [ER+]: Secondary | ICD-10-CM

## 2021-04-27 DIAGNOSIS — C50412 Malignant neoplasm of upper-outer quadrant of left female breast: Secondary | ICD-10-CM | POA: Diagnosis not present

## 2021-04-27 MED ORDER — PEGFILGRASTIM-CBQV 6 MG/0.6ML ~~LOC~~ SOSY
6.0000 mg | PREFILLED_SYRINGE | Freq: Once | SUBCUTANEOUS | Status: AC
  Administered 2021-04-27: 6 mg via SUBCUTANEOUS
  Filled 2021-04-27: qty 0.6

## 2021-05-08 ENCOUNTER — Ambulatory Visit: Payer: BC Managed Care – PPO

## 2021-05-08 ENCOUNTER — Other Ambulatory Visit: Payer: BC Managed Care – PPO

## 2021-05-08 ENCOUNTER — Ambulatory Visit: Payer: BC Managed Care – PPO | Admitting: Hematology and Oncology

## 2021-05-08 ENCOUNTER — Ambulatory Visit: Payer: BC Managed Care – PPO | Admitting: Adult Health

## 2021-05-09 ENCOUNTER — Inpatient Hospital Stay (HOSPITAL_BASED_OUTPATIENT_CLINIC_OR_DEPARTMENT_OTHER): Payer: BC Managed Care – PPO | Admitting: Adult Health

## 2021-05-09 ENCOUNTER — Encounter: Payer: Self-pay | Admitting: *Deleted

## 2021-05-09 ENCOUNTER — Inpatient Hospital Stay: Payer: BC Managed Care – PPO

## 2021-05-09 ENCOUNTER — Encounter: Payer: Self-pay | Admitting: Adult Health

## 2021-05-09 VITALS — BP 159/85 | HR 83 | Temp 97.9°F | Resp 18 | Ht 63.0 in | Wt 148.7 lb

## 2021-05-09 VITALS — BP 142/87 | HR 74 | Temp 98.6°F | Resp 16

## 2021-05-09 DIAGNOSIS — Z170 Estrogen receptor positive status [ER+]: Secondary | ICD-10-CM | POA: Diagnosis not present

## 2021-05-09 DIAGNOSIS — C50412 Malignant neoplasm of upper-outer quadrant of left female breast: Secondary | ICD-10-CM | POA: Diagnosis not present

## 2021-05-09 DIAGNOSIS — Z5111 Encounter for antineoplastic chemotherapy: Secondary | ICD-10-CM | POA: Diagnosis not present

## 2021-05-09 DIAGNOSIS — Z5189 Encounter for other specified aftercare: Secondary | ICD-10-CM | POA: Diagnosis not present

## 2021-05-09 LAB — CMP (CANCER CENTER ONLY)
ALT: 13 U/L (ref 0–44)
AST: 12 U/L — ABNORMAL LOW (ref 15–41)
Albumin: 3.8 g/dL (ref 3.5–5.0)
Alkaline Phosphatase: 95 U/L (ref 38–126)
Anion gap: 5 (ref 5–15)
BUN: 10 mg/dL (ref 6–20)
CO2: 31 mmol/L (ref 22–32)
Calcium: 8.9 mg/dL (ref 8.9–10.3)
Chloride: 104 mmol/L (ref 98–111)
Creatinine: 0.61 mg/dL (ref 0.44–1.00)
GFR, Estimated: >60 mL/min (ref >60)
Glucose, Bld: 103 mg/dL — ABNORMAL HIGH (ref 70–99)
Potassium: 3.7 mmol/L (ref 3.5–5.1)
Sodium: 140 mmol/L (ref 135–145)
Total Bilirubin: 0.3 mg/dL (ref 0.3–1.2)
Total Protein: 6.7 g/dL (ref 6.5–8.1)

## 2021-05-09 LAB — CBC WITH DIFFERENTIAL (CANCER CENTER ONLY)
Abs Immature Granulocytes: 0.29 10*3/uL — ABNORMAL HIGH (ref 0.00–0.07)
Basophils Absolute: 0.1 10*3/uL (ref 0.0–0.1)
Basophils Relative: 1 %
Eosinophils Absolute: 0 10*3/uL (ref 0.0–0.5)
Eosinophils Relative: 0 %
HCT: 29 % — ABNORMAL LOW (ref 36.0–46.0)
Hemoglobin: 9.6 g/dL — ABNORMAL LOW (ref 12.0–15.0)
Immature Granulocytes: 2 %
Lymphocytes Relative: 6 %
Lymphs Abs: 0.7 10*3/uL (ref 0.7–4.0)
MCH: 31.2 pg (ref 26.0–34.0)
MCHC: 33.1 g/dL (ref 30.0–36.0)
MCV: 94.2 fL (ref 80.0–100.0)
Monocytes Absolute: 1.3 10*3/uL — ABNORMAL HIGH (ref 0.1–1.0)
Monocytes Relative: 10 %
Neutro Abs: 9.7 10*3/uL — ABNORMAL HIGH (ref 1.7–7.7)
Neutrophils Relative %: 81 %
Platelet Count: 159 10*3/uL (ref 150–400)
RBC: 3.08 MIL/uL — ABNORMAL LOW (ref 3.87–5.11)
RDW: 16.5 % — ABNORMAL HIGH (ref 11.5–15.5)
WBC Count: 12.1 10*3/uL — ABNORMAL HIGH (ref 4.0–10.5)
nRBC: 0.2 % (ref 0.0–0.2)

## 2021-05-09 MED ORDER — SODIUM CHLORIDE 0.9% FLUSH
10.0000 mL | INTRAVENOUS | Status: DC | PRN
  Administered 2021-05-09: 10 mL

## 2021-05-09 MED ORDER — HEPARIN SOD (PORK) LOCK FLUSH 100 UNIT/ML IV SOLN
500.0000 [IU] | Freq: Once | INTRAVENOUS | Status: AC | PRN
  Administered 2021-05-09: 500 [IU]

## 2021-05-09 MED ORDER — SODIUM CHLORIDE 0.9 % IV SOLN: Freq: Once | INTRAVENOUS | Status: AC

## 2021-05-09 MED ORDER — SODIUM CHLORIDE 0.9 % IV SOLN
80.0000 mg/m2 | Freq: Once | INTRAVENOUS | Status: AC
  Administered 2021-05-09: 138 mg via INTRAVENOUS
  Filled 2021-05-09: qty 23

## 2021-05-09 MED ORDER — DIPHENHYDRAMINE HCL 50 MG/ML IJ SOLN
50.0000 mg | Freq: Once | INTRAMUSCULAR | Status: AC
  Administered 2021-05-09: 50 mg via INTRAVENOUS

## 2021-05-09 MED ORDER — SODIUM CHLORIDE 0.9 % IV SOLN
10.0000 mg | Freq: Once | INTRAVENOUS | Status: AC
  Administered 2021-05-09: 10 mg via INTRAVENOUS
  Filled 2021-05-09: qty 10

## 2021-05-09 MED ORDER — SODIUM CHLORIDE 0.9% FLUSH
10.0000 mL | Freq: Once | INTRAVENOUS | Status: AC
  Administered 2021-05-09: 10 mL

## 2021-05-09 MED ORDER — FAMOTIDINE IN NACL 20-0.9 MG/50ML-% IV SOLN
20.0000 mg | Freq: Once | INTRAVENOUS | Status: AC
  Administered 2021-05-09: 20 mg via INTRAVENOUS

## 2021-05-09 NOTE — Assessment & Plan Note (Addendum)
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, PR 0%, Ki-67 40%, HER2 2+ equivocal by IHC, FISH negative Lymph node biopsy: Benign ?? ?Breast MRI 02/12/2021: 4 suspicious masses within the left breast consistent with multifocal, multicentric disease 2 of the 4 masses have been biopsied (2 cm, 1.3 cm, 1.2 cm, 1.7 cm) for mildly prominent level 1 lymph nodes are identified ?? ?Treatment plan: ?1.??Neoadjuvant chemotherapy with dose dense Adriamycin Cytoxan followed by Taxol ? ?2.?mastectomy?with sentinel lymph node biopsy ?3.??Adjuvant radiation ?4.??Followed by adjuvant antiestrogen therapy ?------------------------------------------------------------------------------------------ ?Current Treatment: Cycle?1/12 weekly Taxol (neoadjuvant for stage IIIA breast cancer) ?Chemo Toxicities: ?? ?Jasmine Maddox is here today for follow-up of her breast cancer currently receiving neoadjuvant chemotherapy.  We discussed the new chemotherapy with weekly Taxol.  She is planning on using gloves and ice for neuropathy prevention.  WE discussed other common side effects of the treatment along with her premedications.  She verbalized understanding and will proceed with her first cycle.   ? ?I completed her clinical staging today in the EMR.  She has stage IIIA cancer.  I reviewed this with Dr. Lindi Adie and he and I discussed whether he wants her to undergo staging with CT chest/abdomen/pelvis.  He is going to consider this and will review with Jasmine Maddox at her appointment with him in 2 weeks. ? ?We will see Jasmine Maddox back with every other Taxol treatment.  She will return next week for labs and treatment.  She knows we are happy to see her on the weeks she doesn't have an appointment with [place] if needed. ? ?? ?

## 2021-05-09 NOTE — Progress Notes (Signed)
[facility] Follow up: ?  ? ?Jasmine Huxley, PA ?[address] ?[address] [place] [address] ? ? ?DIAGNOSIS:  Cancer Staging  ?Ductal carcinoma in situ (DCIS) of right breast ?Staging form: Breast, AJCC 8th Edition ?- Clinical stage from 02/18/2018: Stage 0 (cTis (DCIS), cN0, cM0, ER+, PR+) - Unsigned ?Nuclear grade: G3 ?Laterality: Right ?- Pathologic: Stage 0 (pTis (DCIS), pN0, cM0, ER+, PR+) - Signed by Gardenia Phlegm, NP on 04/01/2018 ? ?Malignant neoplasm of upper-outer quadrant of left breast in female, estrogen receptor positive ([place]) ?Staging form: Breast, AJCC 8th Edition ?- Clinical stage from 01/19/2021: Stage IIIA (cT2, cN1, cM0, G3, ER+, PR-, HER2-) - Signed by Gardenia Phlegm, NP on 05/09/2021 ?Stage prefix: Initial diagnosis ?Histologic grading system: 3 grade system ? ? ?SUMMARY OF ONCOLOGIC HISTORY: ?Oncology History  ?Ductal carcinoma in situ (DCIS) of right breast  ?01/30/2018 Mammogram  ? Diagnostic Mammogram  ?0.4cm cluster of calcifications, 12 o'clock position, 4cm fn, right breast ?  ?02/12/2018 Initial Diagnosis  ? Screening detected right breast calcifications 4 mm size UOQ 11:30 position stereotactic biopsy revealed high-grade DCIS ER 90%, PR 10%, Tis NX stage 0 ? ?  ?02/18/2018 Genetic Testing  ? Negative.  Genes tested include: ATM, BRCA1, BRCA2, CDH1, CHEK2, PALB2, PTEN, STK11 and TP53; APC, ATM, AXIN2, BARD1, BMPR1A, BRCA1, BRCA2, BRIP1, CDH1, CDKN2A (p14ARF), CDKN2A (p16INK4a), CKD4, CHEK2, CTNNA1, DICER1, EPCAM (Deletion/duplication testing only), GREM1 (promoter region deletion/duplication testing only), KIT, MEN1, MLH1, MSH2, MSH3, MSH6, MUTYH, NBN, NF1, NHTL1, PALB2, PDGFRA, PMS2, POLD1, POLE, PTEN, RAD50, RAD51C, RAD51D, SDHB, SDHC, SDHD, SMAD4, SMARCA4. STK11, TP53, TSC1, TSC2, and VHL.  The following genes were evaluated for sequence changes only: SDHA and HOXB13 c.251G>A variant only. ?  ?03/20/2018 Surgery  ? Right lumpectomy: Scattered  microscopic foci of DCIS intermediate grade, margins negative, ER 90%, PR 10%, Tis NX stage 0 ? ?  ?04/01/2018 Cancer Staging  ? Staging form: Breast, AJCC 8th Edition ?- Pathologic: Stage 0 (pTis (DCIS), pN0, cM0, ER+, PR+) - Signed by Gardenia Phlegm, NP on 04/01/2018 ? ?  ?04/28/2018 - 05/26/2018 Radiation Therapy  ? Adjuvant radiation 1. Right breast; 15 fractions of 2.67 Gy for a total of 40.05 Gy 2. Boost; 5 fractions of 2 Gy for a total of 10 Gy ?  ?  ?05/2018 -  Anti-estrogen oral therapy  ? Tamoxifen daily ?  ?Malignant neoplasm of upper-outer quadrant of left breast in female, estrogen receptor positive ([facility])  ?01/19/2021 Initial Diagnosis  ? Left breast biopsy: 2:00 and 4:00: Grade 3 IDC ER 40% weak, PR 0%, Ki-67 40%, HER2 2+ equivocal by IHC, FISH negative ?Lymph node biopsy: Benign ?  ?01/19/2021 Cancer Staging  ? Staging form: Breast, AJCC 8th Edition ?- Clinical stage from 01/19/2021: Stage IIIA (cT2, cN1, cM0, G3, ER+, PR-, HER2-) - Signed by Gardenia Phlegm, NP on 05/09/2021 ?Stage prefix: Initial diagnosis ?Histologic grading system: 3 grade system ? ?  ?02/12/2021 Breast MRI  ? Breast MRI 02/12/2021: 4 suspicious masses within the left breast consistent with multifocal, multicentric disease 2 of the 4 masses have been biopsied (2 cm, 1.3 cm, 1.2 cm, 1.7 cm) for mildly prominent level 1 lymph nodes are identified ?  ?03/14/2021 -  Chemotherapy  ? Patient is on Treatment Plan : BREAST ADJUVANT DOSE DENSE AC q14d / PACLitaxel q7d  ? ?  ?  ? ? ?CURRENT THERAPY: Taxol ? ?INTERVAL HISTORY: ?Jasmine Maddox 61 y.o. female returns for evaluation  prior to receiving her first week of Taxol chemotherapy.  She is doing moderately well today.  She is mildly fatigued and slightly anxious about what to expect with this new chemotherapy.  She did complete her 4 cycles of Adriamycin and Cytoxan with minimal issues. ? ? ?Patient Active Problem List  ? Diagnosis Date Noted  ? Malignant neoplasm of upper-outer  quadrant of left breast in female, estrogen receptor positive ([place]) 02/14/2021  ? Genetic testing 02/25/2018  ? Family history of breast cancer   ? Ductal carcinoma in situ (DCIS) of right breast 02/12/2018  ? ? ?has No Known Allergies. ? ?MEDICAL HISTORY: ?Past Medical History:  ?Diagnosis Date  ? Cervical cancer ([place])   ? Family history of breast cancer   ? Hypertension   ? ? ?SURGICAL HISTORY: ?Past Surgical History:  ?Procedure Laterality Date  ? BREAST LUMPECTOMY WITH RADIOACTIVE SEED LOCALIZATION Right 03/20/2018  ? Procedure: RIGHT BREAST LUMPECTOMY WITH RADIOACTIVE SEED LOCALIZATION;  Surgeon: [name], MD;  Location: [facility];  Service: General;  Laterality: Right;  ? CHOLECYSTECTOMY    ? PORTACATH PLACEMENT Right 03/13/2021  ? Procedure: INSERTION PORT-A-CATH;  Surgeon: Rolm Bookbinder, MD;  Location: [facility];  Service: General;  Laterality: Right;  ? ? ?SOCIAL HISTORY: ?Social History  ? ?Socioeconomic History  ? Marital status: Married  ?  Spouse name: Not on file  ? Number of children: Not on file  ? Years of education: Not on file  ? Highest education level: Not on file  ?Occupational History  ? Not on file  ?Tobacco Use  ? Smoking status: Never  ? Smokeless tobacco: Never  ?Vaping Use  ? Vaping Use: Never used  ?Substance and Sexual Activity  ? Alcohol use: Yes  ?  Comment: socially  ? Drug use: Never  ? Sexual activity: Not on file  ?Other Topics Concern  ? Not on file  ?Social History Narrative  ? Not on file  ? ?Social Determinants of Health  ? ?Financial Resource Strain: Not on file  ?Food Insecurity: Not on file  ?Transportation Needs: Not on file  ?Physical Activity: Not on file  ?Stress: Not on file  ?Social Connections: Not on file  ?Intimate Partner Violence: Not on file  ? ? ?FAMILY HISTORY: ?Family History  ?Problem Relation Age of Onset  ? Breast cancer Mother 66  ?     d. 57  ? Breast cancer Sister 27  ?     triple negative, "negative genetic  testing 2019"  ? ? ?Review of Systems  ?Constitutional:  Positive for fatigue. Negative for appetite change, chills, fever and unexpected weight change.  ?HENT:   Negative for hearing loss, lump/mass and trouble swallowing.   ?Eyes:  Negative for eye problems and icterus.  ?Respiratory:  Negative for chest tightness, cough and shortness of breath.   ?Cardiovascular:  Negative for chest pain, leg swelling and palpitations.  ?Gastrointestinal:  Negative for abdominal distention, abdominal pain, constipation, diarrhea, nausea and vomiting.  ?Endocrine: Negative for hot flashes.  ?Genitourinary:  Negative for difficulty urinating.   ?Musculoskeletal:  Negative for arthralgias.  ?Skin:  Negative for itching and rash.  ?Neurological:  Negative for dizziness, extremity weakness, headaches and numbness.  ?Hematological:  Negative for adenopathy. Does not bruise/bleed easily.  ?Psychiatric/Behavioral:  Negative for depression. The patient is not nervous/anxious.    ? ? ?PHYSICAL EXAMINATION ? ?ECOG PERFORMANCE STATUS: 1 - Symptomatic but completely ambulatory ? ?Vitals:  ? 05/09/21 1114  ?  BP: (!) 159/85  ?Pulse: 83  ?Resp: 18  ?Temp: 97.9 ?F (36.6 ?C)  ?SpO2: 100%  ? ? ?Physical Exam ?Constitutional:   ?   General: She is not in acute distress. ?   Appearance: Normal appearance. She is not toxic-appearing.  ?HENT:  ?   Head: Normocephalic and atraumatic.  ?Eyes:  ?   General: No scleral icterus. ?Cardiovascular:  ?   Rate and Rhythm: Normal rate and regular rhythm.  ?   Pulses: Normal pulses.  ?   Heart sounds: Normal heart sounds.  ?Pulmonary:  ?   Effort: Pulmonary effort is normal.  ?   Breath sounds: Normal breath sounds.  ?Abdominal:  ?   General: Abdomen is flat. Bowel sounds are normal. There is no distension.  ?   Palpations: Abdomen is soft.  ?   Tenderness: There is no abdominal tenderness.  ?Musculoskeletal:     ?   General: No swelling.  ?   Cervical back: Neck supple.  ?Lymphadenopathy:  ?   Cervical: No  cervical adenopathy.  ?Skin: ?   General: Skin is warm and dry.  ?   Findings: No rash.  ?Neurological:  ?   General: No focal deficit present.  ?   Mental Status: She is alert.  ?Psychiatric:     ?   Mood and Affect:

## 2021-05-09 NOTE — Patient Instructions (Signed)
[facility]  Discharge Instructions: ?Thank you for choosing [facility] to provide your oncology and hematology care.  ? ?If you have a lab appointment with the [facility], please go directly to the [facility] and check in at the registration area. ?  ?Wear comfortable clothing and clothing appropriate for easy access to any Portacath or PICC line.  ? ?We strive to give you quality time with your provider. You may need to reschedule your appointment if you arrive late (15 or more minutes).  Arriving late affects you and other patients whose appointments are after yours.  Also, if you miss three or more appointments without notifying the office, you may be dismissed from the clinic at the provider?s discretion.    ?  ?For prescription refill requests, have your pharmacy contact our office and allow 72 hours for refills to be completed.   ? ?Today you received the following chemotherapy and/or immunotherapy agents: Paclitaxel (Taxol)    ?  ?To help prevent nausea and vomiting after your treatment, we encourage you to take your nausea medication as directed. ? ?BELOW ARE SYMPTOMS THAT SHOULD BE REPORTED IMMEDIATELY: ?*FEVER GREATER THAN 100.4 F (38 ?C) OR HIGHER ?*CHILLS OR SWEATING ?*NAUSEA AND VOMITING THAT IS NOT CONTROLLED WITH YOUR NAUSEA MEDICATION ?*UNUSUAL SHORTNESS OF BREATH ?*UNUSUAL BRUISING OR BLEEDING ?*URINARY PROBLEMS (pain or burning when urinating, or frequent urination) ?*BOWEL PROBLEMS (unusual diarrhea, constipation, pain near the anus) ?TENDERNESS IN MOUTH AND THROAT WITH OR WITHOUT PRESENCE OF ULCERS (sore throat, sores in mouth, or a toothache) ?UNUSUAL RASH, SWELLING OR PAIN  ?UNUSUAL VAGINAL DISCHARGE OR ITCHING  ? ?Items with * indicate a potential emergency and should be followed up as soon as possible or go to the Emergency Department if any problems should occur. ? ?Please show the CHEMOTHERAPY ALERT CARD or IMMUNOTHERAPY ALERT CARD at  check-in to the Emergency Department and triage nurse. ? ?Should you have questions after your visit or need to cancel or reschedule your appointment, please contact [facility]  Dept: [phone]  and follow the prompts.  Office hours are 8:00 a.m. to 4:30 p.m. Monday - Friday. Please note that voicemails left after 4:00 p.m. may not be returned until the following business day.  We are closed weekends and major holidays. You have access to a nurse at all times for urgent questions. Please call the main number to the clinic Dept: [phone] and follow the prompts. ? ? ?For any non-urgent questions, you may also contact your provider using MyChart. We now offer e-Visits for anyone 47 and older to request care online for non-urgent symptoms. For details visit [email or web address]. ?  ?Also download the MyChart app! Go to the app store, search "MyChart", open the app, select [facility], and log in with your MyChart username and password. ? ?Due to Covid, a mask is required upon entering the hospital/clinic. If you do not have a mask, one will be given to you upon arrival. For doctor visits, patients may have 1 support person aged 84 or older with them. For treatment visits, patients cannot have anyone with them due to current Covid guidelines and our immunocompromised population.  ? ?

## 2021-05-10 ENCOUNTER — Other Ambulatory Visit: Payer: BC Managed Care – PPO

## 2021-05-10 ENCOUNTER — Inpatient Hospital Stay: Payer: BC Managed Care – PPO

## 2021-05-10 ENCOUNTER — Telehealth: Payer: Self-pay

## 2021-05-10 NOTE — Telephone Encounter (Signed)
LM for patient that this nurse was calling to see how they were doing after their treatment. Please call back to Dr. Gudena's nurse at [phone] if they have any questions or concerns regarding the treatment. 

## 2021-05-10 NOTE — Progress Notes (Signed)
? ?Patient Care Team: ?[name], PA as PCP - General (Family Medicine) ?[name], MD as Consulting Physician (General Surgery) ?[name], MD as Consulting Physician (Hematology and Oncology) ?[name], MD as Consulting Physician (Radiation Oncology) ?Rockwell Germany, RN as Oncology Nurse Navigator ?[name], RN as Oncology Nurse Navigator ? ?DIAGNOSIS:  ?Encounter Diagnosis  ?Name Primary?  ? Malignant neoplasm of upper-outer quadrant of left breast in female, estrogen receptor positive ([place])   ? ? ?SUMMARY OF ONCOLOGIC HISTORY: ?Oncology History  ?Ductal carcinoma in situ (DCIS) of right breast  ?01/30/2018 Mammogram  ? Diagnostic Mammogram  ?0.4cm cluster of calcifications, 12 o'clock position, 4cm fn, right breast ?  ?02/12/2018 Initial Diagnosis  ? Screening detected right breast calcifications 4 mm size UOQ 11:30 position stereotactic biopsy revealed high-grade DCIS ER 90%, PR 10%, Tis NX stage 0 ? ?  ?02/18/2018 Genetic Testing  ? Negative.  Genes tested include: ATM, BRCA1, BRCA2, CDH1, CHEK2, PALB2, PTEN, STK11 and TP53; APC, ATM, AXIN2, BARD1, BMPR1A, BRCA1, BRCA2, BRIP1, CDH1, CDKN2A (p14ARF), CDKN2A (p16INK4a), CKD4, CHEK2, CTNNA1, DICER1, EPCAM (Deletion/duplication testing only), GREM1 (promoter region deletion/duplication testing only), KIT, MEN1, MLH1, MSH2, MSH3, MSH6, MUTYH, NBN, NF1, NHTL1, PALB2, PDGFRA, PMS2, POLD1, POLE, PTEN, RAD50, RAD51C, RAD51D, SDHB, SDHC, SDHD, SMAD4, SMARCA4. STK11, TP53, TSC1, TSC2, and VHL.  The following genes were evaluated for sequence changes only: SDHA and HOXB13 c.251G>A variant only. ?  ?03/20/2018 Surgery  ? Right lumpectomy: Scattered microscopic foci of DCIS intermediate grade, margins negative, ER 90%, PR 10%, Tis NX stage 0 ? ?  ?04/01/2018 Cancer Staging  ? Staging form: Breast, AJCC 8th Edition ?- Pathologic: Stage 0 (pTis (DCIS), pN0, cM0, ER+, PR+) - Signed by Gardenia Phlegm, NP on 04/01/2018 ? ?  ?04/28/2018 - 05/26/2018  Radiation Therapy  ? Adjuvant radiation 1. Right breast; 15 fractions of 2.67 Gy for a total of 40.05 Gy 2. Boost; 5 fractions of 2 Gy for a total of 10 Gy ?  ?  ?05/2018 -  Anti-estrogen oral therapy  ? Tamoxifen daily ?  ?Malignant neoplasm of upper-outer quadrant of left breast in female, estrogen receptor positive ([place])  ?01/19/2021 Initial Diagnosis  ? Left breast biopsy: 2:00 and 4:00: Grade 3 IDC ER 40% weak, PR 0%, Ki-67 40%, HER2 2+ equivocal by IHC, FISH negative ?Lymph node biopsy: Benign ?  ?01/19/2021 Cancer Staging  ? Staging form: Breast, AJCC 8th Edition ?- Clinical stage from 01/19/2021: Stage IIIA (cT2, cN1, cM0, G3, ER+, PR-, HER2-) - Signed by Gardenia Phlegm, NP on 05/09/2021 ?Stage prefix: Initial diagnosis ?Histologic grading system: 3 grade system ? ?  ?02/12/2021 Breast MRI  ? Breast MRI 02/12/2021: 4 suspicious masses within the left breast consistent with multifocal, multicentric disease 2 of the 4 masses have been biopsied (2 cm, 1.3 cm, 1.2 cm, 1.7 cm) for mildly prominent level 1 lymph nodes are identified ?  ?03/14/2021 -  Chemotherapy  ? Patient is on Treatment Plan : BREAST ADJUVANT DOSE DENSE AC q14d / PACLitaxel q7d  ? ?  ?  ? ? ?CHIEF COMPLIANT:  Left breast cancer  ? ?INTERVAL HISTORY: Jasmine Maddox is a  61 y.o. with above-mentioned history of left breast cancer who is currently on chemotherapy with Adriamycin and Cytoxan. She presents to the clinic today for treatment. ? ? ?ALLERGIES:  has No Known Allergies. ? ?MEDICATIONS:  ?Current Outpatient Medications  ?Medication Sig Dispense Refill  ? ALPRAZolam (XANAX) 0.25 MG tablet Take 1 tablet (  0.25 mg total) by mouth 2 (two) times daily as needed for anxiety. 30 tablet 1  ? chlorhexidine (PERIDEX) 0.12 % solution 15 mLs 2 (two) times daily.    ? HYDROcodone-acetaminophen (NORCO/VICODIN) 5-325 MG tablet Take 1 tablet by mouth every 8 (eight) hours as needed for moderate pain. 30 tablet 0  ? lidocaine-prilocaine (EMLA) cream  Apply to affected area once 30 g 3  ? lisinopril (PRINIVIL,ZESTRIL) 20 MG tablet Take 20 mg by mouth daily.    ? mupirocin ointment (BACTROBAN) 2 % Apply 1 application. topically daily. 22 g 0  ? ondansetron (ZOFRAN) 8 MG tablet Take 1 tablet (8 mg total) by mouth 2 (two) times daily as needed. Start on the third day after chemotherapy. 30 tablet 1  ? prochlorperazine (COMPAZINE) 10 MG tablet Take 1 tablet (10 mg total) by mouth every 6 (six) hours as needed (Nausea or vomiting). 30 tablet 1  ? tamoxifen (NOLVADEX) 20 MG tablet Take 20 mg by mouth daily.    ? ?No current facility-administered medications for this visit.  ? ? ?PHYSICAL EXAMINATION: ?ECOG PERFORMANCE STATUS: 1 - Symptomatic but completely ambulatory ? ?Vitals:  ? 05/24/21 1141  ?BP: 139/85  ?Pulse: 91  ?Resp: 18  ?Temp: 98.1 ?F (36.7 ?C)  ?SpO2: 97%  ? ?Filed Weights  ? 05/24/21 1141  ?Weight: 147 lb 9.6 oz (67 kg)  ? ?LABORATORY DATA:  ?I have reviewed the data as listed ? ?  Latest Ref Rng & Units 05/16/2021  ? 11:49 AM 05/09/2021  ? 10:59 AM 04/25/2021  ? 11:51 AM  ?CMP  ?Glucose 70 - 99 mg/dL 109   103   86    ?BUN 6 - 20 mg/dL '15   10   12    ' ?Creatinine 0.44 - 1.00 mg/dL 0.62   0.61   0.67    ?Sodium 135 - 145 mmol/L 140   140   141    ?Potassium 3.5 - 5.1 mmol/L 3.5   3.7   3.8    ?Chloride 98 - 111 mmol/L 105   104   107    ?CO2 22 - 32 mmol/L '31   31   31    ' ?Calcium 8.9 - 10.3 mg/dL 9.0   8.9   9.0    ?Total Protein 6.5 - 8.1 g/dL 6.8   6.7   6.9    ?Total Bilirubin 0.3 - 1.2 mg/dL 0.3   0.3   0.3    ?Alkaline Phos 38 - 126 U/L 63   95   98    ?AST 15 - 41 U/L '15   12   19    ' ?ALT 0 - 44 U/L 15   13   37    ? ? ?Lab Results  ?Component Value Date  ? WBC 4.7 05/24/2021  ? HGB 9.6 (L) 05/24/2021  ? HCT 27.9 (L) 05/24/2021  ? MCV 93.9 05/24/2021  ? PLT 262 05/24/2021  ? NEUTROABS 3.1 05/24/2021  ? ? ?ASSESSMENT & PLAN:  ?Malignant neoplasm of upper-outer quadrant of left breast in female, estrogen receptor positive ([place]) ?01/19/2021:Left breast  biopsy: 2:00 and 4:00: Grade 3 IDC ER 40% weak, PR 0%, Ki-67 40%, HER2 2+ equivocal by IHC, FISH negative Lymph node biopsy: Benign ?  ?Breast MRI 02/12/2021: 4 suspicious masses within the left breast consistent with multifocal, multicentric disease 2 of the 4 masses have been biopsied (2 cm, 1.3 cm, 1.2 cm, 1.7 cm) for mildly prominent level 1 lymph nodes  are identified ?  ?Treatment plan: ?1.  Neoadjuvant chemotherapy with dose dense Adriamycin Cytoxan followed by Taxol   ?2. mastectomy with sentinel lymph node biopsy ?3.  Adjuvant radiation ?4.  Followed by adjuvant antiestrogen therapy ?------------------------------------------------------------------------------------------ ?Current Treatment: Completed 4 cycles of dose dense Adriamycin and Cytoxan, today cycle 3 Taxol ?Chemo Toxicities: ?Severe anxiety:  Much improved. ?Mild nausea: Resolved ?Chemotherapy-induced anemia: Monitoring closely today's hemoglobin is 9.6 ? ? ?RTC weekly for Taxol and every other week for follow-up with me. ? ? ? ?No orders of the defined types were placed in this encounter. ? ?The patient has a good understanding of the overall plan. she agrees with it. she will call with any problems that may develop before the next visit here. ?Total time spent: 30 mins including face to face time and time spent for planning, charting and co-ordination of care ? ? Harriette Ohara, MD ?05/24/21 ? ? ? I Gardiner Coins am scribing for Dr. Lindi Adie ? ?I have reviewed the above documentation for accuracy and completeness, and I agree with the above. ?  ?

## 2021-05-10 NOTE — Telephone Encounter (Signed)
-----   Message from Willis Modena, RN sent at 05/09/2021  4:06 PM EDT ----- ?Regarding: Dr. Lindi Adie 1st time Taxol f/u tol well ?Dr. Lindi Adie 1st time Taxol tolerated well. Tolerated cryotherapy. ? ?

## 2021-05-11 ENCOUNTER — Ambulatory Visit: Payer: BC Managed Care – PPO

## 2021-05-16 ENCOUNTER — Other Ambulatory Visit: Payer: Self-pay

## 2021-05-16 ENCOUNTER — Inpatient Hospital Stay: Payer: BC Managed Care – PPO

## 2021-05-16 VITALS — BP 140/72 | HR 81 | Temp 98.6°F | Resp 16 | Wt 148.5 lb

## 2021-05-16 DIAGNOSIS — Z170 Estrogen receptor positive status [ER+]: Secondary | ICD-10-CM | POA: Diagnosis not present

## 2021-05-16 DIAGNOSIS — Z5189 Encounter for other specified aftercare: Secondary | ICD-10-CM | POA: Diagnosis not present

## 2021-05-16 DIAGNOSIS — Z5111 Encounter for antineoplastic chemotherapy: Secondary | ICD-10-CM | POA: Diagnosis not present

## 2021-05-16 DIAGNOSIS — C50412 Malignant neoplasm of upper-outer quadrant of left female breast: Secondary | ICD-10-CM | POA: Diagnosis not present

## 2021-05-16 LAB — CMP (CANCER CENTER ONLY)
ALT: 15 U/L (ref 0–44)
AST: 15 U/L (ref 15–41)
Albumin: 3.9 g/dL (ref 3.5–5.0)
Alkaline Phosphatase: 63 U/L (ref 38–126)
Anion gap: 4 — ABNORMAL LOW (ref 5–15)
BUN: 15 mg/dL (ref 6–20)
CO2: 31 mmol/L (ref 22–32)
Calcium: 9 mg/dL (ref 8.9–10.3)
Chloride: 105 mmol/L (ref 98–111)
Creatinine: 0.62 mg/dL (ref 0.44–1.00)
GFR, Estimated: >60 mL/min (ref >60)
Glucose, Bld: 109 mg/dL — ABNORMAL HIGH (ref 70–99)
Potassium: 3.5 mmol/L (ref 3.5–5.1)
Sodium: 140 mmol/L (ref 135–145)
Total Bilirubin: 0.3 mg/dL (ref 0.3–1.2)
Total Protein: 6.8 g/dL (ref 6.5–8.1)

## 2021-05-16 LAB — CBC WITH DIFFERENTIAL (CANCER CENTER ONLY)
Abs Immature Granulocytes: 0.03 10*3/uL (ref 0.00–0.07)
Basophils Absolute: 0.1 10*3/uL (ref 0.0–0.1)
Basophils Relative: 2 %
Eosinophils Absolute: 0.1 10*3/uL (ref 0.0–0.5)
Eosinophils Relative: 1 %
HCT: 28.4 % — ABNORMAL LOW (ref 36.0–46.0)
Hemoglobin: 9.4 g/dL — ABNORMAL LOW (ref 12.0–15.0)
Immature Granulocytes: 1 %
Lymphocytes Relative: 12 %
Lymphs Abs: 0.6 10*3/uL — ABNORMAL LOW (ref 0.7–4.0)
MCH: 31.3 pg (ref 26.0–34.0)
MCHC: 33.1 g/dL (ref 30.0–36.0)
MCV: 94.7 fL (ref 80.0–100.0)
Monocytes Absolute: 0.5 10*3/uL (ref 0.1–1.0)
Monocytes Relative: 10 %
Neutro Abs: 3.7 10*3/uL (ref 1.7–7.7)
Neutrophils Relative %: 74 %
Platelet Count: 317 10*3/uL (ref 150–400)
RBC: 3 MIL/uL — ABNORMAL LOW (ref 3.87–5.11)
RDW: 16.4 % — ABNORMAL HIGH (ref 11.5–15.5)
WBC Count: 5 10*3/uL (ref 4.0–10.5)
nRBC: 0 % (ref 0.0–0.2)

## 2021-05-16 MED ORDER — FAMOTIDINE IN NACL 20-0.9 MG/50ML-% IV SOLN
20.0000 mg | Freq: Once | INTRAVENOUS | Status: AC
  Administered 2021-05-16: 20 mg via INTRAVENOUS
  Filled 2021-05-16: qty 50

## 2021-05-16 MED ORDER — SODIUM CHLORIDE 0.9 % IV SOLN
10.0000 mg | Freq: Once | INTRAVENOUS | Status: AC
  Administered 2021-05-16: 10 mg via INTRAVENOUS
  Filled 2021-05-16: qty 10

## 2021-05-16 MED ORDER — HEPARIN SOD (PORK) LOCK FLUSH 100 UNIT/ML IV SOLN
500.0000 [IU] | Freq: Once | INTRAVENOUS | Status: AC | PRN
  Administered 2021-05-16: 500 [IU]

## 2021-05-16 MED ORDER — SODIUM CHLORIDE 0.9 % IV SOLN: Freq: Once | INTRAVENOUS | Status: AC

## 2021-05-16 MED ORDER — SODIUM CHLORIDE 0.9% FLUSH
10.0000 mL | Freq: Once | INTRAVENOUS | Status: AC
  Administered 2021-05-16: 10 mL

## 2021-05-16 MED ORDER — DIPHENHYDRAMINE HCL 50 MG/ML IJ SOLN
50.0000 mg | Freq: Once | INTRAMUSCULAR | Status: AC
  Administered 2021-05-16: 50 mg via INTRAVENOUS
  Filled 2021-05-16: qty 1

## 2021-05-16 MED ORDER — SODIUM CHLORIDE 0.9 % IV SOLN
80.0000 mg/m2 | Freq: Once | INTRAVENOUS | Status: AC
  Administered 2021-05-16: 138 mg via INTRAVENOUS
  Filled 2021-05-16: qty 23

## 2021-05-16 MED ORDER — SODIUM CHLORIDE 0.9% FLUSH
10.0000 mL | INTRAVENOUS | Status: DC | PRN
  Administered 2021-05-16: 10 mL

## 2021-05-16 NOTE — Patient Instructions (Signed)
[facility]  ? Discharge Instructions: ?Thank you for choosing [facility] to provide your oncology and hematology care.  ? ?If you have a lab appointment with the [facility], please go directly to the [facility] and check in at the registration area. ?  ?Wear comfortable clothing and clothing appropriate for easy access to any Portacath or PICC line.  ? ?We strive to give you quality time with your provider. You may need to reschedule your appointment if you arrive late (15 or more minutes).  Arriving late affects you and other patients whose appointments are after yours.  Also, if you miss three or more appointments without notifying the office, you may be dismissed from the clinic at the provider?s discretion.    ?  ?For prescription refill requests, have your pharmacy contact our office and allow 72 hours for refills to be completed.   ? ?Today you received the following chemotherapy and/or immunotherapy agents: Paclitaxel (Taxol)     ?  ?To help prevent nausea and vomiting after your treatment, we encourage you to take your nausea medication as directed. ? ?BELOW ARE SYMPTOMS THAT SHOULD BE REPORTED IMMEDIATELY: ?*FEVER GREATER THAN 100.4 F (38 ?C) OR HIGHER ?*CHILLS OR SWEATING ?*NAUSEA AND VOMITING THAT IS NOT CONTROLLED WITH YOUR NAUSEA MEDICATION ?*UNUSUAL SHORTNESS OF BREATH ?*UNUSUAL BRUISING OR BLEEDING ?*URINARY PROBLEMS (pain or burning when urinating, or frequent urination) ?*BOWEL PROBLEMS (unusual diarrhea, constipation, pain near the anus) ?TENDERNESS IN MOUTH AND THROAT WITH OR WITHOUT PRESENCE OF ULCERS (sore throat, sores in mouth, or a toothache) ?UNUSUAL RASH, SWELLING OR PAIN  ?UNUSUAL VAGINAL DISCHARGE OR ITCHING  ? ?Items with * indicate a potential emergency and should be followed up as soon as possible or go to the Emergency Department if any problems should occur. ? ?Please show the CHEMOTHERAPY ALERT CARD or IMMUNOTHERAPY ALERT CARD at  check-in to the Emergency Department and triage nurse. ? ?Should you have questions after your visit or need to cancel or reschedule your appointment, please contact [facility]  Dept: [phone]  and follow the prompts.  Office hours are 8:00 a.m. to 4:30 p.m. Monday - Friday. Please note that voicemails left after 4:00 p.m. may not be returned until the following business day.  We are closed weekends and major holidays. You have access to a nurse at all times for urgent questions. Please call the main number to the clinic Dept: [phone] and follow the prompts. ? ? ?For any non-urgent questions, you may also contact your provider using MyChart. We now offer e-Visits for anyone 85 and older to request care online for non-urgent symptoms. For details visit [email or web address]. ?  ?Also download the MyChart app! Go to the app store, search "MyChart", open the app, select [facility], and log in with your MyChart username and password. ? ?Due to Covid, a mask is required upon entering the hospital/clinic. If you do not have a mask, one will be given to you upon arrival. For doctor visits, patients may have 1 support person aged 70 or older with them. For treatment visits, patients cannot have anyone with them due to current Covid guidelines and our immunocompromised population.  ? ?

## 2021-05-23 ENCOUNTER — Inpatient Hospital Stay: Payer: BC Managed Care – PPO

## 2021-05-23 ENCOUNTER — Inpatient Hospital Stay: Payer: BC Managed Care – PPO | Admitting: Hematology and Oncology

## 2021-05-24 ENCOUNTER — Other Ambulatory Visit: Payer: Self-pay

## 2021-05-24 ENCOUNTER — Inpatient Hospital Stay: Payer: BC Managed Care – PPO | Attending: Hematology and Oncology

## 2021-05-24 ENCOUNTER — Inpatient Hospital Stay (HOSPITAL_BASED_OUTPATIENT_CLINIC_OR_DEPARTMENT_OTHER): Payer: BC Managed Care – PPO | Admitting: Hematology and Oncology

## 2021-05-24 ENCOUNTER — Inpatient Hospital Stay: Payer: BC Managed Care – PPO

## 2021-05-24 DIAGNOSIS — C50412 Malignant neoplasm of upper-outer quadrant of left female breast: Secondary | ICD-10-CM | POA: Diagnosis not present

## 2021-05-24 DIAGNOSIS — Z170 Estrogen receptor positive status [ER+]: Secondary | ICD-10-CM | POA: Insufficient documentation

## 2021-05-24 DIAGNOSIS — Z5111 Encounter for antineoplastic chemotherapy: Secondary | ICD-10-CM | POA: Insufficient documentation

## 2021-05-24 DIAGNOSIS — D0511 Intraductal carcinoma in situ of right breast: Secondary | ICD-10-CM | POA: Insufficient documentation

## 2021-05-24 LAB — CBC WITH DIFFERENTIAL (CANCER CENTER ONLY)
Abs Immature Granulocytes: 0.02 10*3/uL (ref 0.00–0.07)
Basophils Absolute: 0.1 10*3/uL (ref 0.0–0.1)
Basophils Relative: 2 %
Eosinophils Absolute: 0.2 10*3/uL (ref 0.0–0.5)
Eosinophils Relative: 5 %
HCT: 27.9 % — ABNORMAL LOW (ref 36.0–46.0)
Hemoglobin: 9.6 g/dL — ABNORMAL LOW (ref 12.0–15.0)
Immature Granulocytes: 0 %
Lymphocytes Relative: 14 %
Lymphs Abs: 0.6 10*3/uL — ABNORMAL LOW (ref 0.7–4.0)
MCH: 32.3 pg (ref 26.0–34.0)
MCHC: 34.4 g/dL (ref 30.0–36.0)
MCV: 93.9 fL (ref 80.0–100.0)
Monocytes Absolute: 0.6 10*3/uL (ref 0.1–1.0)
Monocytes Relative: 14 %
Neutro Abs: 3.1 10*3/uL (ref 1.7–7.7)
Neutrophils Relative %: 65 %
Platelet Count: 262 10*3/uL (ref 150–400)
RBC: 2.97 MIL/uL — ABNORMAL LOW (ref 3.87–5.11)
RDW: 15.3 % (ref 11.5–15.5)
WBC Count: 4.7 10*3/uL (ref 4.0–10.5)
nRBC: 0 % (ref 0.0–0.2)

## 2021-05-24 LAB — CMP (CANCER CENTER ONLY)
ALT: 17 U/L (ref 0–44)
AST: 16 U/L (ref 15–41)
Albumin: 3.8 g/dL (ref 3.5–5.0)
Alkaline Phosphatase: 56 U/L (ref 38–126)
Anion gap: 4 — ABNORMAL LOW (ref 5–15)
BUN: 11 mg/dL (ref 6–20)
CO2: 30 mmol/L (ref 22–32)
Calcium: 9 mg/dL (ref 8.9–10.3)
Chloride: 103 mmol/L (ref 98–111)
Creatinine: 0.63 mg/dL (ref 0.44–1.00)
GFR, Estimated: >60 mL/min (ref >60)
Glucose, Bld: 110 mg/dL — ABNORMAL HIGH (ref 70–99)
Potassium: 3.6 mmol/L (ref 3.5–5.1)
Sodium: 137 mmol/L (ref 135–145)
Total Bilirubin: 0.4 mg/dL (ref 0.3–1.2)
Total Protein: 6.8 g/dL (ref 6.5–8.1)

## 2021-05-24 MED ORDER — SODIUM CHLORIDE 0.9 % IV SOLN: Freq: Once | INTRAVENOUS | Status: AC

## 2021-05-24 MED ORDER — SODIUM CHLORIDE 0.9 % IV SOLN
10.0000 mg | Freq: Once | INTRAVENOUS | Status: AC
  Administered 2021-05-24: 10 mg via INTRAVENOUS
  Filled 2021-05-24: qty 10

## 2021-05-24 MED ORDER — SODIUM CHLORIDE 0.9 % IV SOLN
80.0000 mg/m2 | Freq: Once | INTRAVENOUS | Status: AC
  Administered 2021-05-24: 138 mg via INTRAVENOUS
  Filled 2021-05-24: qty 23

## 2021-05-24 MED ORDER — DIPHENHYDRAMINE HCL 50 MG/ML IJ SOLN
50.0000 mg | Freq: Once | INTRAMUSCULAR | Status: AC
  Administered 2021-05-24: 50 mg via INTRAVENOUS
  Filled 2021-05-24: qty 1

## 2021-05-24 MED ORDER — FAMOTIDINE IN NACL 20-0.9 MG/50ML-% IV SOLN
20.0000 mg | Freq: Once | INTRAVENOUS | Status: AC
  Administered 2021-05-24: 20 mg via INTRAVENOUS
  Filled 2021-05-24: qty 50

## 2021-05-24 MED ORDER — HEPARIN SOD (PORK) LOCK FLUSH 100 UNIT/ML IV SOLN
500.0000 [IU] | Freq: Once | INTRAVENOUS | Status: AC | PRN
  Administered 2021-05-24: 500 [IU]

## 2021-05-24 MED ORDER — SODIUM CHLORIDE 0.9% FLUSH
10.0000 mL | Freq: Once | INTRAVENOUS | Status: AC
  Administered 2021-05-24: 10 mL

## 2021-05-24 MED ORDER — SODIUM CHLORIDE 0.9% FLUSH
10.0000 mL | INTRAVENOUS | Status: DC | PRN
  Administered 2021-05-24: 10 mL

## 2021-05-24 NOTE — Patient Instructions (Signed)
[facility]  ? Discharge Instructions: ?Thank you for choosing [facility] to provide your oncology and hematology care.  ? ?If you have a lab appointment with the [facility], please go directly to the [facility] and check in at the registration area. ?  ?Wear comfortable clothing and clothing appropriate for easy access to any Portacath or PICC line.  ? ?We strive to give you quality time with your provider. You may need to reschedule your appointment if you arrive late (15 or more minutes).  Arriving late affects you and other patients whose appointments are after yours.  Also, if you miss three or more appointments without notifying the office, you may be dismissed from the clinic at the provider?s discretion.    ?  ?For prescription refill requests, have your pharmacy contact our office and allow 72 hours for refills to be completed.   ? ?Today you received the following chemotherapy and/or immunotherapy agents: Paclitaxel (Taxol)     ?  ?To help prevent nausea and vomiting after your treatment, we encourage you to take your nausea medication as directed. ? ?BELOW ARE SYMPTOMS THAT SHOULD BE REPORTED IMMEDIATELY: ?*FEVER GREATER THAN 100.4 F (38 ?C) OR HIGHER ?*CHILLS OR SWEATING ?*NAUSEA AND VOMITING THAT IS NOT CONTROLLED WITH YOUR NAUSEA MEDICATION ?*UNUSUAL SHORTNESS OF BREATH ?*UNUSUAL BRUISING OR BLEEDING ?*URINARY PROBLEMS (pain or burning when urinating, or frequent urination) ?*BOWEL PROBLEMS (unusual diarrhea, constipation, pain near the anus) ?TENDERNESS IN MOUTH AND THROAT WITH OR WITHOUT PRESENCE OF ULCERS (sore throat, sores in mouth, or a toothache) ?UNUSUAL RASH, SWELLING OR PAIN  ?UNUSUAL VAGINAL DISCHARGE OR ITCHING  ? ?Items with * indicate a potential emergency and should be followed up as soon as possible or go to the Emergency Department if any problems should occur. ? ?Please show the CHEMOTHERAPY ALERT CARD or IMMUNOTHERAPY ALERT CARD at  check-in to the Emergency Department and triage nurse. ? ?Should you have questions after your visit or need to cancel or reschedule your appointment, please contact [facility]  Dept: [phone]  and follow the prompts.  Office hours are 8:00 a.m. to 4:30 p.m. Monday - Friday. Please note that voicemails left after 4:00 p.m. may not be returned until the following business day.  We are closed weekends and major holidays. You have access to a nurse at all times for urgent questions. Please call the main number to the clinic Dept: [phone] and follow the prompts. ? ? ?For any non-urgent questions, you may also contact your provider using MyChart. We now offer e-Visits for anyone 61 and older to request care online for non-urgent symptoms. For details visit [email or web address]. ?  ?Also download the MyChart app! Go to the app store, search "MyChart", open the app, select [facility], and log in with your MyChart username and password. ? ?Due to Covid, a mask is required upon entering the hospital/clinic. If you do not have a mask, one will be given to you upon arrival. For doctor visits, patients may have 1 support person aged 36 or older with them. For treatment visits, patients cannot have anyone with them due to current Covid guidelines and our immunocompromised population.  ? ?

## 2021-05-24 NOTE — Assessment & Plan Note (Signed)
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, PR 0%, Ki-67 40%, HER2 2+ equivocal by IHC, FISH negative Lymph node biopsy: Benign ?? ?Breast MRI 02/12/2021: 4 suspicious masses within the left breast consistent with multifocal, multicentric disease 2 of the 4 masses have been biopsied (2 cm, 1.3 cm, 1.2 cm, 1.7 cm) for mildly prominent level 1 lymph nodes are identified ?? ?Treatment plan: ?1.??Neoadjuvant chemotherapy with dose dense Adriamycin Cytoxan followed by Taxol ? ?2.?mastectomy?with sentinel lymph node biopsy ?3.??Adjuvant radiation ?4.??Followed by adjuvant antiestrogen therapy ?------------------------------------------------------------------------------------------ ?Current Treatment: Completed 4 cycles of?dose dense?Adriamycin and Cytoxan, today cycle 3 Taxol ?Chemo Toxicities: ?1. Severe anxiety:??Much improved. ?2. Mild nausea:?Resolved ?3. Chemotherapy-induced anemia: Monitoring closely today's hemoglobin is 10.2 ?4. Back spasms related to Neulasta:?No longer a problem ?? ?She is still working full-time and is able to handle work pressures. ?? ?RTC weekly for Taxol and every other week for follow-up with me. ?

## 2021-05-30 ENCOUNTER — Inpatient Hospital Stay: Payer: BC Managed Care – PPO

## 2021-05-31 ENCOUNTER — Inpatient Hospital Stay: Payer: BC Managed Care – PPO

## 2021-05-31 ENCOUNTER — Other Ambulatory Visit: Payer: Self-pay

## 2021-05-31 VITALS — BP 149/82 | HR 98 | Temp 98.2°F | Resp 16 | Wt 142.5 lb

## 2021-05-31 DIAGNOSIS — Z170 Estrogen receptor positive status [ER+]: Secondary | ICD-10-CM

## 2021-05-31 DIAGNOSIS — D0511 Intraductal carcinoma in situ of right breast: Secondary | ICD-10-CM | POA: Diagnosis not present

## 2021-05-31 DIAGNOSIS — Z5111 Encounter for antineoplastic chemotherapy: Secondary | ICD-10-CM | POA: Diagnosis not present

## 2021-05-31 DIAGNOSIS — C50412 Malignant neoplasm of upper-outer quadrant of left female breast: Secondary | ICD-10-CM | POA: Diagnosis not present

## 2021-05-31 LAB — CBC WITH DIFFERENTIAL (CANCER CENTER ONLY)
Abs Immature Granulocytes: 0.03 10*3/uL (ref 0.00–0.07)
Basophils Absolute: 0.1 10*3/uL (ref 0.0–0.1)
Basophils Relative: 2 %
Eosinophils Absolute: 0.3 10*3/uL (ref 0.0–0.5)
Eosinophils Relative: 6 %
HCT: 27.1 % — ABNORMAL LOW (ref 36.0–46.0)
Hemoglobin: 9.3 g/dL — ABNORMAL LOW (ref 12.0–15.0)
Immature Granulocytes: 1 %
Lymphocytes Relative: 11 %
Lymphs Abs: 0.5 10*3/uL — ABNORMAL LOW (ref 0.7–4.0)
MCH: 32 pg (ref 26.0–34.0)
MCHC: 34.3 g/dL (ref 30.0–36.0)
MCV: 93.1 fL (ref 80.0–100.0)
Monocytes Absolute: 0.4 10*3/uL (ref 0.1–1.0)
Monocytes Relative: 8 %
Neutro Abs: 3.4 10*3/uL (ref 1.7–7.7)
Neutrophils Relative %: 72 %
Platelet Count: 262 10*3/uL (ref 150–400)
RBC: 2.91 MIL/uL — ABNORMAL LOW (ref 3.87–5.11)
RDW: 14.3 % (ref 11.5–15.5)
WBC Count: 4.6 10*3/uL (ref 4.0–10.5)
nRBC: 0 % (ref 0.0–0.2)

## 2021-05-31 LAB — CMP (CANCER CENTER ONLY)
ALT: 15 U/L (ref 0–44)
AST: 14 U/L — ABNORMAL LOW (ref 15–41)
Albumin: 3.8 g/dL (ref 3.5–5.0)
Alkaline Phosphatase: 48 U/L (ref 38–126)
Anion gap: 5 (ref 5–15)
BUN: 13 mg/dL (ref 6–20)
CO2: 31 mmol/L (ref 22–32)
Calcium: 9 mg/dL (ref 8.9–10.3)
Chloride: 101 mmol/L (ref 98–111)
Creatinine: 0.63 mg/dL (ref 0.44–1.00)
GFR, Estimated: >60 mL/min (ref >60)
Glucose, Bld: 96 mg/dL (ref 70–99)
Potassium: 3.8 mmol/L (ref 3.5–5.1)
Sodium: 137 mmol/L (ref 135–145)
Total Bilirubin: 0.5 mg/dL (ref 0.3–1.2)
Total Protein: 7.1 g/dL (ref 6.5–8.1)

## 2021-05-31 MED ORDER — SODIUM CHLORIDE 0.9% FLUSH
10.0000 mL | Freq: Once | INTRAVENOUS | Status: AC
  Administered 2021-05-31: 10 mL

## 2021-05-31 MED ORDER — SODIUM CHLORIDE 0.9 % IV SOLN: Freq: Once | INTRAVENOUS | Status: AC

## 2021-05-31 MED ORDER — SODIUM CHLORIDE 0.9 % IV SOLN
80.0000 mg/m2 | Freq: Once | INTRAVENOUS | Status: AC
  Administered 2021-05-31: 138 mg via INTRAVENOUS
  Filled 2021-05-31: qty 23

## 2021-05-31 MED ORDER — DIPHENHYDRAMINE HCL 50 MG/ML IJ SOLN
50.0000 mg | Freq: Once | INTRAMUSCULAR | Status: AC
  Administered 2021-05-31: 50 mg via INTRAVENOUS
  Filled 2021-05-31: qty 1

## 2021-05-31 MED ORDER — FAMOTIDINE IN NACL 20-0.9 MG/50ML-% IV SOLN
20.0000 mg | Freq: Once | INTRAVENOUS | Status: AC
  Administered 2021-05-31: 20 mg via INTRAVENOUS
  Filled 2021-05-31: qty 50

## 2021-05-31 MED ORDER — SODIUM CHLORIDE 0.9 % IV SOLN
10.0000 mg | Freq: Once | INTRAVENOUS | Status: AC
  Administered 2021-05-31: 10 mg via INTRAVENOUS
  Filled 2021-05-31: qty 10

## 2021-05-31 MED ORDER — SODIUM CHLORIDE 0.9% FLUSH
10.0000 mL | INTRAVENOUS | Status: DC | PRN
  Administered 2021-05-31: 10 mL

## 2021-05-31 MED ORDER — HEPARIN SOD (PORK) LOCK FLUSH 100 UNIT/ML IV SOLN
500.0000 [IU] | Freq: Once | INTRAVENOUS | Status: AC | PRN
  Administered 2021-05-31: 500 [IU]

## 2021-05-31 NOTE — Patient Instructions (Signed)
[facility]  Discharge Instructions: ?Thank you for choosing [facility] to provide your oncology and hematology care.  ? ?If you have a lab appointment with the [facility], please go directly to the [facility] and check in at the registration area. ?  ?Wear comfortable clothing and clothing appropriate for easy access to any Portacath or PICC line.  ? ?We strive to give you quality time with your provider. You may need to reschedule your appointment if you arrive late (15 or more minutes).  Arriving late affects you and other patients whose appointments are after yours.  Also, if you miss three or more appointments without notifying the office, you may be dismissed from the clinic at the provider?s discretion.    ?  ?For prescription refill requests, have your pharmacy contact our office and allow 72 hours for refills to be completed.   ? ?Today you received the following chemotherapy and/or immunotherapy agents: Taxol    ?  ?To help prevent nausea and vomiting after your treatment, we encourage you to take your nausea medication as directed. ? ?BELOW ARE SYMPTOMS THAT SHOULD BE REPORTED IMMEDIATELY: ?*FEVER GREATER THAN 100.4 F (38 ?C) OR HIGHER ?*CHILLS OR SWEATING ?*NAUSEA AND VOMITING THAT IS NOT CONTROLLED WITH YOUR NAUSEA MEDICATION ?*UNUSUAL SHORTNESS OF BREATH ?*UNUSUAL BRUISING OR BLEEDING ?*URINARY PROBLEMS (pain or burning when urinating, or frequent urination) ?*BOWEL PROBLEMS (unusual diarrhea, constipation, pain near the anus) ?TENDERNESS IN MOUTH AND THROAT WITH OR WITHOUT PRESENCE OF ULCERS (sore throat, sores in mouth, or a toothache) ?UNUSUAL RASH, SWELLING OR PAIN  ?UNUSUAL VAGINAL DISCHARGE OR ITCHING  ? ?Items with * indicate a potential emergency and should be followed up as soon as possible or go to the Emergency Department if any problems should occur. ? ?Please show the CHEMOTHERAPY ALERT CARD or IMMUNOTHERAPY ALERT CARD at check-in to the  Emergency Department and triage nurse. ? ?Should you have questions after your visit or need to cancel or reschedule your appointment, please contact [facility]  Dept: [phone]  and follow the prompts.  Office hours are 8:00 a.m. to 4:30 p.m. Monday - Friday. Please note that voicemails left after 4:00 p.m. may not be returned until the following business day.  We are closed weekends and major holidays. You have access to a nurse at all times for urgent questions. Please call the main number to the clinic Dept: [phone] and follow the prompts. ? ? ?For any non-urgent questions, you may also contact your provider using MyChart. We now offer e-Visits for anyone 29 and older to request care online for non-urgent symptoms. For details visit [email or web address]. ?  ?Also download the MyChart app! Go to the app store, search "MyChart", open the app, select [facility], and log in with your MyChart username and password. ? ?Due to Covid, a mask is required upon entering the hospital/clinic. If you do not have a mask, one will be given to you upon arrival. For doctor visits, patients may have 1 support person aged 101 or older with them. For treatment visits, patients cannot have anyone with them due to current Covid guidelines and our immunocompromised population.  ? ?

## 2021-05-31 NOTE — Progress Notes (Signed)
Patient Care Team: [name], PA as PCP - General (Family Medicine) [name], MD as Consulting Physician (General Surgery) [name], MD as Consulting Physician (Hematology and Oncology) [name], MD as Consulting Physician (Radiation Oncology) Rockwell Germany, RN as Oncology Nurse Navigator [name], RN as Oncology Nurse Navigator  DIAGNOSIS:  Encounter Diagnosis  Name Primary?   Malignant neoplasm of upper-outer quadrant of left breast in female, estrogen receptor positive ([place])     SUMMARY OF ONCOLOGIC HISTORY: Oncology History  Ductal carcinoma in situ (DCIS) of right breast  01/30/2018 Mammogram   Diagnostic Mammogram  0.4cm cluster of calcifications, 12 o'clock position, 4cm fn, right breast   02/12/2018 Initial Diagnosis   Screening detected right breast calcifications 4 mm size UOQ 11:30 position stereotactic biopsy revealed high-grade DCIS ER 90%, PR 10%, Tis NX stage 0    02/18/2018 Genetic Testing   Negative.  Genes tested include: ATM, BRCA1, BRCA2, CDH1, CHEK2, PALB2, PTEN, STK11 and TP53; APC, ATM, AXIN2, BARD1, BMPR1A, BRCA1, BRCA2, BRIP1, CDH1, CDKN2A (p14ARF), CDKN2A (p16INK4a), CKD4, CHEK2, CTNNA1, DICER1, EPCAM (Deletion/duplication testing only), GREM1 (promoter region deletion/duplication testing only), KIT, MEN1, MLH1, MSH2, MSH3, MSH6, MUTYH, NBN, NF1, NHTL1, PALB2, PDGFRA, PMS2, POLD1, POLE, PTEN, RAD50, RAD51C, RAD51D, SDHB, SDHC, SDHD, SMAD4, SMARCA4. STK11, TP53, TSC1, TSC2, and VHL.  The following genes were evaluated for sequence changes only: SDHA and HOXB13 c.251G>A variant only.   03/20/2018 Surgery   Right lumpectomy: Scattered microscopic foci of DCIS intermediate grade, margins negative, ER 90%, PR 10%, Tis NX stage 0    04/01/2018 Cancer Staging   Staging form: Breast, AJCC 8th Edition - Pathologic: Stage 0 (pTis (DCIS), pN0, cM0, ER+, PR+) - Signed by Gardenia Phlegm, NP on 04/01/2018    04/28/2018 - 05/26/2018  Radiation Therapy   Adjuvant radiation 1. Right breast; 15 fractions of 2.67 Gy for a total of 40.05 Gy 2. Boost; 5 fractions of 2 Gy for a total of 10 Gy     05/2018 -  Anti-estrogen oral therapy   Tamoxifen daily   Malignant neoplasm of upper-outer quadrant of left breast in female, estrogen receptor positive ([place])  01/19/2021 Initial Diagnosis   Left breast biopsy: 2:00 and 4:00: Grade 3 IDC ER 40% weak, PR 0%, Ki-67 40%, HER2 2+ equivocal by IHC, FISH negative Lymph node biopsy: Benign   01/19/2021 Cancer Staging   Staging form: Breast, AJCC 8th Edition - Clinical stage from 01/19/2021: Stage IIIA (cT2, cN1, cM0, G3, ER+, PR-, HER2-) - Signed by Gardenia Phlegm, NP on 05/09/2021 Stage prefix: Initial diagnosis Histologic grading system: 3 grade system    02/12/2021 Breast MRI   Breast MRI 02/12/2021: 4 suspicious masses within the left breast consistent with multifocal, multicentric disease 2 of the 4 masses have been biopsied (2 cm, 1.3 cm, 1.2 cm, 1.7 cm) for mildly prominent level 1 lymph nodes are identified   03/14/2021 -  Chemotherapy   Patient is on Treatment Plan : BREAST ADJUVANT DOSE DENSE AC q14d / PACLitaxel q7d        CHIEF COMPLIANT:  Left breast cancer  on cycle 5 Taxol  INTERVAL HISTORY: Jasmine Maddox is a 61 y.o. with above-mentioned history of left breast cancer. She presents to the clinic today for a follow-up. She complains of fatigue. Complains of some itching on back and around breast area. Denies constipation and diarrhea. Denies numbness and tingling in hands and toes. She complains of taste buds not normal. Denies nausea.  ALLERGIES:  has No Known Allergies.  MEDICATIONS:  Current Outpatient Medications  Medication Sig Dispense Refill   ALPRAZolam (XANAX) 0.25 MG tablet Take 1 tablet (0.25 mg total) by mouth 2 (two) times daily as needed for anxiety. 30 tablet 1   chlorhexidine (PERIDEX) 0.12 % solution 15 mLs 2 (two) times daily.      lidocaine-prilocaine (EMLA) cream Apply to affected area once 30 g 3   lisinopril (PRINIVIL,ZESTRIL) 20 MG tablet Take 20 mg by mouth daily.     mupirocin ointment (BACTROBAN) 2 % Apply 1 application. topically daily. 22 g 0   ondansetron (ZOFRAN) 8 MG tablet Take 1 tablet (8 mg total) by mouth 2 (two) times daily as needed. Start on the third day after chemotherapy. 30 tablet 1   prochlorperazine (COMPAZINE) 10 MG tablet Take 1 tablet (10 mg total) by mouth every 6 (six) hours as needed (Nausea or vomiting). 30 tablet 1   tamoxifen (NOLVADEX) 20 MG tablet Take 20 mg by mouth daily.     No current facility-administered medications for this visit.    PHYSICAL EXAMINATION: ECOG PERFORMANCE STATUS: 1 - Symptomatic but completely ambulatory  Vitals:   06/07/21 1402  BP: 122/69  Pulse: 90  Resp: 19  Temp: (!) 97.5 F (36.4 C)  SpO2: 96%   Filed Weights   06/07/21 1402  Weight: 141 lb 6.4 oz (64.1 kg)      LABORATORY DATA:  I have reviewed the data as listed    Latest Ref Rng & Units 05/31/2021   10:44 AM 05/24/2021   11:23 AM 05/16/2021   11:49 AM  CMP  Glucose 70 - 99 mg/dL 96   110   109    BUN 6 - 20 mg/dL _0 Creatinine 0.44 - 1.00 mg/dL 0.63   0.63   0.62    Sodium 135 - 145 mmol/L 137   137   140    Potassium 3.5 - 5.1 mmol/L 3.8   3.6   3.5    Chloride 98 - 111 mmol/L 101   103   105    CO2 22 - 32 mmol/L _1 Calcium 8.9 - 10.3 mg/dL 9.0   9.0   9.0    Total Protein 6.5 - 8.1 g/dL 7.1   6.8   6.8    Total Bilirubin 0.3 - 1.2 mg/dL 0.5   0.4   0.3    Alkaline Phos 38 - 126 U/L 48   56   63    AST 15 - 41 U/L _2 ALT 0 - 44 U/L _3 Lab Results  Component Value Date   WBC 4.4 06/07/2021   HGB 9.0 (L) 06/07/2021   HCT 26.6 (L) 06/07/2021   MCV 93.3 06/07/2021   PLT 257 06/07/2021   NEUTROABS 3.0 06/07/2021    ASSESSMENT & PLAN:  Malignant neoplasm of upper-outer quadrant of left breast in female, estrogen  receptor positive ([place]) 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, PR 0%, Ki-67 40%, HER2 2+ equivocal by IHC, FISH negative Lymph node biopsy: Benign   Breast MRI 02/12/2021: 4 suspicious masses within the left breast consistent with multifocal, multicentric disease 2 of the 4 masses have been biopsied (2 cm, 1.3 cm, 1.2 cm, 1.7 cm)  for mildly prominent level 1 lymph nodes are identified   Treatment plan: 1.  Neoadjuvant chemotherapy with dose dense Adriamycin Cytoxan followed by Taxol   2. mastectomy with sentinel lymph node biopsy 3.  Adjuvant radiation 4.  Followed by adjuvant antiestrogen therapy ------------------------------------------------------------------------------------------ Current Treatment: Completed 4 cycles of dose dense Adriamycin and Cytoxan, today cycle 5 Taxol Chemo Toxicities: Severe anxiety:  Much improved. Mild nausea: Resolved Chemotherapy-induced anemia: Monitoring closely today's hemoglobin is 9 Fatigue as a result of chemotherapy Body aches and pains     RTC weekly for Taxol and every other week for follow-up with me.      No orders of the defined types were placed in this encounter.  The patient has a good understanding of the overall plan. she agrees with it. she will call with any problems that may develop before the next visit here. Total time spent: 30 mins including face to face time and time spent for planning, charting and co-ordination of care   Harriette Ohara, MD 06/07/21    I Gardiner Coins am scribing for Dr. Lindi Adie  I have reviewed the above documentation for accuracy and completeness, and I agree with the above.

## 2021-06-06 ENCOUNTER — Inpatient Hospital Stay: Payer: BC Managed Care – PPO | Admitting: Hematology and Oncology

## 2021-06-06 ENCOUNTER — Inpatient Hospital Stay: Payer: BC Managed Care – PPO

## 2021-06-06 MED FILL — Dexamethasone Sodium Phosphate Inj 100 MG/10ML: INTRAMUSCULAR | Qty: 1 | Status: AC

## 2021-06-07 ENCOUNTER — Inpatient Hospital Stay (HOSPITAL_BASED_OUTPATIENT_CLINIC_OR_DEPARTMENT_OTHER): Payer: BC Managed Care – PPO | Admitting: Hematology and Oncology

## 2021-06-07 ENCOUNTER — Inpatient Hospital Stay: Payer: BC Managed Care – PPO

## 2021-06-07 ENCOUNTER — Other Ambulatory Visit: Payer: Self-pay

## 2021-06-07 DIAGNOSIS — Z170 Estrogen receptor positive status [ER+]: Secondary | ICD-10-CM

## 2021-06-07 DIAGNOSIS — Z5111 Encounter for antineoplastic chemotherapy: Secondary | ICD-10-CM | POA: Diagnosis not present

## 2021-06-07 DIAGNOSIS — D0511 Intraductal carcinoma in situ of right breast: Secondary | ICD-10-CM | POA: Diagnosis not present

## 2021-06-07 DIAGNOSIS — C50412 Malignant neoplasm of upper-outer quadrant of left female breast: Secondary | ICD-10-CM

## 2021-06-07 LAB — CBC WITH DIFFERENTIAL (CANCER CENTER ONLY)
Abs Immature Granulocytes: 0.01 10*3/uL (ref 0.00–0.07)
Basophils Absolute: 0.1 10*3/uL (ref 0.0–0.1)
Basophils Relative: 2 %
Eosinophils Absolute: 0.3 10*3/uL (ref 0.0–0.5)
Eosinophils Relative: 6 %
HCT: 26.6 % — ABNORMAL LOW (ref 36.0–46.0)
Hemoglobin: 9 g/dL — ABNORMAL LOW (ref 12.0–15.0)
Immature Granulocytes: 0 %
Lymphocytes Relative: 14 %
Lymphs Abs: 0.6 10*3/uL — ABNORMAL LOW (ref 0.7–4.0)
MCH: 31.6 pg (ref 26.0–34.0)
MCHC: 33.8 g/dL (ref 30.0–36.0)
MCV: 93.3 fL (ref 80.0–100.0)
Monocytes Absolute: 0.4 10*3/uL (ref 0.1–1.0)
Monocytes Relative: 9 %
Neutro Abs: 3 10*3/uL (ref 1.7–7.7)
Neutrophils Relative %: 69 %
Platelet Count: 257 10*3/uL (ref 150–400)
RBC: 2.85 MIL/uL — ABNORMAL LOW (ref 3.87–5.11)
RDW: 13.6 % (ref 11.5–15.5)
WBC Count: 4.4 10*3/uL (ref 4.0–10.5)
nRBC: 0 % (ref 0.0–0.2)

## 2021-06-07 LAB — CMP (CANCER CENTER ONLY)
ALT: 18 U/L (ref 0–44)
AST: 16 U/L (ref 15–41)
Albumin: 3.8 g/dL (ref 3.5–5.0)
Alkaline Phosphatase: 52 U/L (ref 38–126)
Anion gap: 6 (ref 5–15)
BUN: 12 mg/dL (ref 6–20)
CO2: 30 mmol/L (ref 22–32)
Calcium: 9.2 mg/dL (ref 8.9–10.3)
Chloride: 100 mmol/L (ref 98–111)
Creatinine: 0.71 mg/dL (ref 0.44–1.00)
GFR, Estimated: >60 mL/min (ref >60)
Glucose, Bld: 98 mg/dL (ref 70–99)
Potassium: 3.7 mmol/L (ref 3.5–5.1)
Sodium: 136 mmol/L (ref 135–145)
Total Bilirubin: 0.5 mg/dL (ref 0.3–1.2)
Total Protein: 7.3 g/dL (ref 6.5–8.1)

## 2021-06-07 MED ORDER — FAMOTIDINE IN NACL 20-0.9 MG/50ML-% IV SOLN
20.0000 mg | Freq: Once | INTRAVENOUS | Status: AC
  Administered 2021-06-07: 20 mg via INTRAVENOUS
  Filled 2021-06-07: qty 50

## 2021-06-07 MED ORDER — SODIUM CHLORIDE 0.9 % IV SOLN
80.0000 mg/m2 | Freq: Once | INTRAVENOUS | Status: AC
  Administered 2021-06-07: 138 mg via INTRAVENOUS
  Filled 2021-06-07: qty 23

## 2021-06-07 MED ORDER — SODIUM CHLORIDE 0.9% FLUSH
10.0000 mL | INTRAVENOUS | Status: DC | PRN
  Administered 2021-06-07: 10 mL

## 2021-06-07 MED ORDER — SODIUM CHLORIDE 0.9 % IV SOLN: Freq: Once | INTRAVENOUS | Status: AC

## 2021-06-07 MED ORDER — DIPHENHYDRAMINE HCL 50 MG/ML IJ SOLN
50.0000 mg | Freq: Once | INTRAMUSCULAR | Status: AC
  Administered 2021-06-07: 50 mg via INTRAVENOUS
  Filled 2021-06-07: qty 1

## 2021-06-07 MED ORDER — SODIUM CHLORIDE 0.9% FLUSH
10.0000 mL | Freq: Once | INTRAVENOUS | Status: AC
  Administered 2021-06-07: 10 mL

## 2021-06-07 MED ORDER — HEPARIN SOD (PORK) LOCK FLUSH 100 UNIT/ML IV SOLN
500.0000 [IU] | Freq: Once | INTRAVENOUS | Status: AC | PRN
  Administered 2021-06-07: 500 [IU]

## 2021-06-07 MED ORDER — SODIUM CHLORIDE 0.9 % IV SOLN
10.0000 mg | Freq: Once | INTRAVENOUS | Status: AC
  Administered 2021-06-07: 10 mg via INTRAVENOUS
  Filled 2021-06-07: qty 10

## 2021-06-07 NOTE — Assessment & Plan Note (Addendum)
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, PR 0%, Ki-67 40%, HER2 2+ equivocal by IHC, FISH negative Lymph node biopsy: Benign  Breast MRI 02/12/2021: 4 suspicious masses within the left breast consistent with multifocal, multicentric disease 2 of the 4 masses have been biopsied (2 cm, 1.3 cm, 1.2 cm, 1.7 cm) for mildly prominent level 1 lymph nodes are identified  Treatment plan: 1.Neoadjuvant chemotherapy with dose dense Adriamycin Cytoxan followed by Taxol  2.mastectomywith sentinel lymph node biopsy 3.Adjuvant radiation 4.Followed by adjuvant antiestrogen therapy ------------------------------------------------------------------------------------------ Current Treatment: Completed 4 cycles ofdose denseAdriamycin and Cytoxan, today cycle 5 Taxol Chemo Toxicities: 1. Severe anxiety:Much improved. 2. Mild nausea:Resolved 3. Chemotherapy-induced anemia: Monitoring closely today's hemoglobin is 9 4. Fatigue as a result of chemotherapy 5. Body aches and pains   RTC weekly for Taxol and every other week for follow-up with me.

## 2021-06-07 NOTE — Patient Instructions (Signed)
[facility]  Discharge Instructions: Thank you for choosing [facility] to provide your oncology and hematology care.   If you have a lab appointment with the [facility], please go directly to the [facility] and check in at the registration area.   Wear comfortable clothing and clothing appropriate for easy access to any Portacath or PICC line.   We strive to give you quality time with your provider. You may need to reschedule your appointment if you arrive late (15 or more minutes).  Arriving late affects you and other patients whose appointments are after yours.  Also, if you miss three or more appointments without notifying the office, you may be dismissed from the clinic at the provider's discretion.      For prescription refill requests, have your pharmacy contact our office and allow 72 hours for refills to be completed.    Today you received the following chemotherapy and/or immunotherapy agents: Taxol      To help prevent nausea and vomiting after your treatment, we encourage you to take your nausea medication as directed.  BELOW ARE SYMPTOMS THAT SHOULD BE REPORTED IMMEDIATELY: *FEVER GREATER THAN 100.4 F (38 C) OR HIGHER *CHILLS OR SWEATING *NAUSEA AND VOMITING THAT IS NOT CONTROLLED WITH YOUR NAUSEA MEDICATION *UNUSUAL SHORTNESS OF BREATH *UNUSUAL BRUISING OR BLEEDING *URINARY PROBLEMS (pain or burning when urinating, or frequent urination) *BOWEL PROBLEMS (unusual diarrhea, constipation, pain near the anus) TENDERNESS IN MOUTH AND THROAT WITH OR WITHOUT PRESENCE OF ULCERS (sore throat, sores in mouth, or a toothache) UNUSUAL RASH, SWELLING OR PAIN  UNUSUAL VAGINAL DISCHARGE OR ITCHING   Items with * indicate a potential emergency and should be followed up as soon as possible or go to the Emergency Department if any problems should occur.  Please show the CHEMOTHERAPY ALERT CARD or IMMUNOTHERAPY ALERT CARD at check-in to the  Emergency Department and triage nurse.  Should you have questions after your visit or need to cancel or reschedule your appointment, please contact [facility]  Dept: [phone]  and follow the prompts.  Office hours are 8:00 a.m. to 4:30 p.m. Monday - Friday. Please note that voicemails left after 4:00 p.m. may not be returned until the following business day.  We are closed weekends and major holidays. You have access to a nurse at all times for urgent questions. Please call the main number to the clinic Dept: [phone] and follow the prompts.   For any non-urgent questions, you may also contact your provider using MyChart. We now offer e-Visits for anyone 79 and older to request care online for non-urgent symptoms. For details visit [email or web address].   Also download the MyChart app! Go to the app store, search "MyChart", open the app, select [facility], and log in with your MyChart username and password.  Due to Covid, a mask is required upon entering the hospital/clinic. If you do not have a mask, one will be given to you upon arrival. For doctor visits, patients may have 1 support person aged 105 or older with them. For treatment visits, patients cannot have anyone with them due to current Covid guidelines and our immunocompromised population.

## 2021-06-13 ENCOUNTER — Inpatient Hospital Stay: Payer: BC Managed Care – PPO

## 2021-06-13 MED FILL — Dexamethasone Sodium Phosphate Inj 100 MG/10ML: INTRAMUSCULAR | Qty: 1 | Status: AC

## 2021-06-14 ENCOUNTER — Other Ambulatory Visit: Payer: Self-pay

## 2021-06-14 ENCOUNTER — Inpatient Hospital Stay: Payer: BC Managed Care – PPO

## 2021-06-14 VITALS — BP 149/87 | HR 61 | Temp 98.4°F | Resp 17 | Wt 142.2 lb

## 2021-06-14 DIAGNOSIS — C50412 Malignant neoplasm of upper-outer quadrant of left female breast: Secondary | ICD-10-CM | POA: Diagnosis not present

## 2021-06-14 DIAGNOSIS — Z170 Estrogen receptor positive status [ER+]: Secondary | ICD-10-CM | POA: Diagnosis not present

## 2021-06-14 DIAGNOSIS — Z5111 Encounter for antineoplastic chemotherapy: Secondary | ICD-10-CM | POA: Diagnosis not present

## 2021-06-14 DIAGNOSIS — D0511 Intraductal carcinoma in situ of right breast: Secondary | ICD-10-CM | POA: Diagnosis not present

## 2021-06-14 LAB — CMP (CANCER CENTER ONLY)
ALT: 25 U/L (ref 0–44)
AST: 29 U/L (ref 15–41)
Albumin: 3.7 g/dL (ref 3.5–5.0)
Alkaline Phosphatase: 53 U/L (ref 38–126)
Anion gap: 6 (ref 5–15)
BUN: 10 mg/dL (ref 6–20)
CO2: 28 mmol/L (ref 22–32)
Calcium: 8.8 mg/dL — ABNORMAL LOW (ref 8.9–10.3)
Chloride: 103 mmol/L (ref 98–111)
Creatinine: 0.65 mg/dL (ref 0.44–1.00)
GFR, Estimated: >60 mL/min (ref >60)
Glucose, Bld: 105 mg/dL — ABNORMAL HIGH (ref 70–99)
Potassium: 3.7 mmol/L (ref 3.5–5.1)
Sodium: 137 mmol/L (ref 135–145)
Total Bilirubin: 0.4 mg/dL (ref 0.3–1.2)
Total Protein: 7 g/dL (ref 6.5–8.1)

## 2021-06-14 LAB — CBC WITH DIFFERENTIAL (CANCER CENTER ONLY)
Abs Immature Granulocytes: 0.02 10*3/uL (ref 0.00–0.07)
Basophils Absolute: 0.1 10*3/uL (ref 0.0–0.1)
Basophils Relative: 2 %
Eosinophils Absolute: 0.2 10*3/uL (ref 0.0–0.5)
Eosinophils Relative: 4 %
HCT: 26.7 % — ABNORMAL LOW (ref 36.0–46.0)
Hemoglobin: 9 g/dL — ABNORMAL LOW (ref 12.0–15.0)
Immature Granulocytes: 1 %
Lymphocytes Relative: 15 %
Lymphs Abs: 0.6 10*3/uL — ABNORMAL LOW (ref 0.7–4.0)
MCH: 31.6 pg (ref 26.0–34.0)
MCHC: 33.7 g/dL (ref 30.0–36.0)
MCV: 93.7 fL (ref 80.0–100.0)
Monocytes Absolute: 0.4 10*3/uL (ref 0.1–1.0)
Monocytes Relative: 11 %
Neutro Abs: 2.8 10*3/uL (ref 1.7–7.7)
Neutrophils Relative %: 67 %
Platelet Count: 265 10*3/uL (ref 150–400)
RBC: 2.85 MIL/uL — ABNORMAL LOW (ref 3.87–5.11)
RDW: 13.2 % (ref 11.5–15.5)
WBC Count: 4 10*3/uL (ref 4.0–10.5)
nRBC: 0 % (ref 0.0–0.2)

## 2021-06-14 MED ORDER — HEPARIN SOD (PORK) LOCK FLUSH 100 UNIT/ML IV SOLN
500.0000 [IU] | Freq: Once | INTRAVENOUS | Status: AC | PRN
  Administered 2021-06-14: 500 [IU]

## 2021-06-14 MED ORDER — SODIUM CHLORIDE 0.9% FLUSH
10.0000 mL | Freq: Once | INTRAVENOUS | Status: AC
  Administered 2021-06-14: 10 mL

## 2021-06-14 MED ORDER — DIPHENHYDRAMINE HCL 50 MG/ML IJ SOLN
50.0000 mg | Freq: Once | INTRAMUSCULAR | Status: AC
  Administered 2021-06-14: 50 mg via INTRAVENOUS
  Filled 2021-06-14: qty 1

## 2021-06-14 MED ORDER — SODIUM CHLORIDE 0.9 % IV SOLN: Freq: Once | INTRAVENOUS | Status: AC

## 2021-06-14 MED ORDER — FAMOTIDINE IN NACL 20-0.9 MG/50ML-% IV SOLN
20.0000 mg | Freq: Once | INTRAVENOUS | Status: AC
  Administered 2021-06-14: 20 mg via INTRAVENOUS
  Filled 2021-06-14: qty 50

## 2021-06-14 MED ORDER — SODIUM CHLORIDE 0.9 % IV SOLN
10.0000 mg | Freq: Once | INTRAVENOUS | Status: AC
  Administered 2021-06-14: 10 mg via INTRAVENOUS
  Filled 2021-06-14: qty 10

## 2021-06-14 MED ORDER — SODIUM CHLORIDE 0.9% FLUSH
10.0000 mL | INTRAVENOUS | Status: DC | PRN
  Administered 2021-06-14: 10 mL

## 2021-06-14 MED ORDER — SODIUM CHLORIDE 0.9 % IV SOLN
80.0000 mg/m2 | Freq: Once | INTRAVENOUS | Status: AC
  Administered 2021-06-14: 138 mg via INTRAVENOUS
  Filled 2021-06-14: qty 23

## 2021-06-14 NOTE — Patient Instructions (Signed)
[facility]  Discharge Instructions: Thank you for choosing [facility] to provide your oncology and hematology care.   If you have a lab appointment with the [facility], please go directly to the [facility] and check in at the registration area.   Wear comfortable clothing and clothing appropriate for easy access to any Portacath or PICC line.   We strive to give you quality time with your provider. You may need to reschedule your appointment if you arrive late (15 or more minutes).  Arriving late affects you and other patients whose appointments are after yours.  Also, if you miss three or more appointments without notifying the office, you may be dismissed from the clinic at the provider's discretion.      For prescription refill requests, have your pharmacy contact our office and allow 72 hours for refills to be completed.    Today you received the following chemotherapy and/or immunotherapy agents: Paclitaxel       To help prevent nausea and vomiting after your treatment, we encourage you to take your nausea medication as directed.  BELOW ARE SYMPTOMS THAT SHOULD BE REPORTED IMMEDIATELY: *FEVER GREATER THAN 100.4 F (38 C) OR HIGHER *CHILLS OR SWEATING *NAUSEA AND VOMITING THAT IS NOT CONTROLLED WITH YOUR NAUSEA MEDICATION *UNUSUAL SHORTNESS OF BREATH *UNUSUAL BRUISING OR BLEEDING *URINARY PROBLEMS (pain or burning when urinating, or frequent urination) *BOWEL PROBLEMS (unusual diarrhea, constipation, pain near the anus) TENDERNESS IN MOUTH AND THROAT WITH OR WITHOUT PRESENCE OF ULCERS (sore throat, sores in mouth, or a toothache) UNUSUAL RASH, SWELLING OR PAIN  UNUSUAL VAGINAL DISCHARGE OR ITCHING   Items with * indicate a potential emergency and should be followed up as soon as possible or go to the Emergency Department if any problems should occur.  Please show the CHEMOTHERAPY ALERT CARD or IMMUNOTHERAPY ALERT CARD at check-in  to the Emergency Department and triage nurse.  Should you have questions after your visit or need to cancel or reschedule your appointment, please contact [facility]  Dept: [phone]  and follow the prompts.  Office hours are 8:00 a.m. to 4:30 p.m. Monday - Friday. Please note that voicemails left after 4:00 p.m. may not be returned until the following business day.  We are closed weekends and major holidays. You have access to a nurse at all times for urgent questions. Please call the main number to the clinic Dept: [phone] and follow the prompts.   For any non-urgent questions, you may also contact your provider using MyChart. We now offer e-Visits for anyone 60 and older to request care online for non-urgent symptoms. For details visit [email or web address].   Also download the MyChart app! Go to the app store, search "MyChart", open the app, select [facility], and log in with your MyChart username and password.  Due to Covid, a mask is required upon entering the hospital/clinic. If you do not have a mask, one will be given to you upon arrival. For doctor visits, patients may have 1 support person aged 60 or older with them. For treatment visits, patients cannot have anyone with them due to current Covid guidelines and our immunocompromised population.

## 2021-06-20 ENCOUNTER — Inpatient Hospital Stay: Payer: BC Managed Care – PPO | Admitting: Adult Health

## 2021-06-20 ENCOUNTER — Telehealth: Payer: Self-pay | Admitting: *Deleted

## 2021-06-20 ENCOUNTER — Inpatient Hospital Stay: Payer: BC Managed Care – PPO

## 2021-06-20 MED FILL — Dexamethasone Sodium Phosphate Inj 100 MG/10ML: INTRAMUSCULAR | Qty: 1 | Status: AC

## 2021-06-20 NOTE — Telephone Encounter (Signed)
Received after hours message from pt regarding bilateral hand and foot itching.  RN attempt x1 to contact pt.  No answer, LVM for pt to return call to the office.

## 2021-06-21 ENCOUNTER — Inpatient Hospital Stay: Payer: BC Managed Care – PPO

## 2021-06-21 ENCOUNTER — Inpatient Hospital Stay: Payer: BC Managed Care – PPO | Attending: Hematology and Oncology

## 2021-06-21 ENCOUNTER — Other Ambulatory Visit: Payer: Self-pay

## 2021-06-21 ENCOUNTER — Inpatient Hospital Stay (HOSPITAL_BASED_OUTPATIENT_CLINIC_OR_DEPARTMENT_OTHER): Payer: BC Managed Care – PPO | Admitting: Hematology and Oncology

## 2021-06-21 VITALS — BP 138/81 | HR 80 | Temp 97.7°F | Resp 16 | Wt 139.8 lb

## 2021-06-21 DIAGNOSIS — G620 Drug-induced polyneuropathy: Secondary | ICD-10-CM | POA: Diagnosis not present

## 2021-06-21 DIAGNOSIS — D6481 Anemia due to antineoplastic chemotherapy: Secondary | ICD-10-CM | POA: Insufficient documentation

## 2021-06-21 DIAGNOSIS — D0511 Intraductal carcinoma in situ of right breast: Secondary | ICD-10-CM | POA: Insufficient documentation

## 2021-06-21 DIAGNOSIS — C50412 Malignant neoplasm of upper-outer quadrant of left female breast: Secondary | ICD-10-CM | POA: Diagnosis not present

## 2021-06-21 DIAGNOSIS — Z79899 Other long term (current) drug therapy: Secondary | ICD-10-CM | POA: Diagnosis not present

## 2021-06-21 DIAGNOSIS — Z170 Estrogen receptor positive status [ER+]: Secondary | ICD-10-CM | POA: Insufficient documentation

## 2021-06-21 DIAGNOSIS — T451X5A Adverse effect of antineoplastic and immunosuppressive drugs, initial encounter: Secondary | ICD-10-CM | POA: Diagnosis not present

## 2021-06-21 DIAGNOSIS — R530 Neoplastic (malignant) related fatigue: Secondary | ICD-10-CM | POA: Insufficient documentation

## 2021-06-21 LAB — CBC WITH DIFFERENTIAL (CANCER CENTER ONLY)
Abs Immature Granulocytes: 0.01 10*3/uL (ref 0.00–0.07)
Basophils Absolute: 0.1 10*3/uL (ref 0.0–0.1)
Basophils Relative: 2 %
Eosinophils Absolute: 0.1 10*3/uL (ref 0.0–0.5)
Eosinophils Relative: 3 %
HCT: 27.1 % — ABNORMAL LOW (ref 36.0–46.0)
Hemoglobin: 9 g/dL — ABNORMAL LOW (ref 12.0–15.0)
Immature Granulocytes: 0 %
Lymphocytes Relative: 16 %
Lymphs Abs: 0.6 10*3/uL — ABNORMAL LOW (ref 0.7–4.0)
MCH: 31.1 pg (ref 26.0–34.0)
MCHC: 33.2 g/dL (ref 30.0–36.0)
MCV: 93.8 fL (ref 80.0–100.0)
Monocytes Absolute: 0.3 10*3/uL (ref 0.1–1.0)
Monocytes Relative: 9 %
Neutro Abs: 2.5 10*3/uL (ref 1.7–7.7)
Neutrophils Relative %: 70 %
Platelet Count: 264 10*3/uL (ref 150–400)
RBC: 2.89 MIL/uL — ABNORMAL LOW (ref 3.87–5.11)
RDW: 13.4 % (ref 11.5–15.5)
WBC Count: 3.5 10*3/uL — ABNORMAL LOW (ref 4.0–10.5)
nRBC: 0 % (ref 0.0–0.2)

## 2021-06-21 LAB — CMP (CANCER CENTER ONLY)
ALT: 20 U/L (ref 0–44)
AST: 18 U/L (ref 15–41)
Albumin: 3.9 g/dL (ref 3.5–5.0)
Alkaline Phosphatase: 48 U/L (ref 38–126)
Anion gap: 5 (ref 5–15)
BUN: 9 mg/dL (ref 6–20)
CO2: 29 mmol/L (ref 22–32)
Calcium: 9.4 mg/dL (ref 8.9–10.3)
Chloride: 103 mmol/L (ref 98–111)
Creatinine: 0.64 mg/dL (ref 0.44–1.00)
GFR, Estimated: >60 mL/min (ref >60)
Glucose, Bld: 122 mg/dL — ABNORMAL HIGH (ref 70–99)
Potassium: 3.5 mmol/L (ref 3.5–5.1)
Sodium: 137 mmol/L (ref 135–145)
Total Bilirubin: 0.4 mg/dL (ref 0.3–1.2)
Total Protein: 6.8 g/dL (ref 6.5–8.1)

## 2021-06-21 MED ORDER — SODIUM CHLORIDE 0.9% FLUSH
10.0000 mL | Freq: Once | INTRAVENOUS | Status: AC
  Administered 2021-06-21: 10 mL

## 2021-06-21 NOTE — Progress Notes (Signed)
Patient Care Team: [name], PA as PCP - General (Family Medicine) [name], MD as Consulting Physician (General Surgery) [name], MD as Consulting Physician (Hematology and Oncology) [name], MD as Consulting Physician (Radiation Oncology) Rockwell Germany, RN as Oncology Nurse Navigator [name], RN as Oncology Nurse Navigator  DIAGNOSIS:  Encounter Diagnosis  Name Primary?   Malignant neoplasm of upper-outer quadrant of left breast in female, estrogen receptor positive ([place]) Yes    SUMMARY OF ONCOLOGIC HISTORY: Oncology History  Ductal carcinoma in situ (DCIS) of right breast  01/30/2018 Mammogram   Diagnostic Mammogram  0.4cm cluster of calcifications, 12 o'clock position, 4cm fn, right breast   02/12/2018 Initial Diagnosis   Screening detected right breast calcifications 4 mm size UOQ 11:30 position stereotactic biopsy revealed high-grade DCIS ER 90%, PR 10%, Tis NX stage 0    02/18/2018 Genetic Testing   Negative.  Genes tested include: ATM, BRCA1, BRCA2, CDH1, CHEK2, PALB2, PTEN, STK11 and TP53; APC, ATM, AXIN2, BARD1, BMPR1A, BRCA1, BRCA2, BRIP1, CDH1, CDKN2A (p14ARF), CDKN2A (p16INK4a), CKD4, CHEK2, CTNNA1, DICER1, EPCAM (Deletion/duplication testing only), GREM1 (promoter region deletion/duplication testing only), KIT, MEN1, MLH1, MSH2, MSH3, MSH6, MUTYH, NBN, NF1, NHTL1, PALB2, PDGFRA, PMS2, POLD1, POLE, PTEN, RAD50, RAD51C, RAD51D, SDHB, SDHC, SDHD, SMAD4, SMARCA4. STK11, TP53, TSC1, TSC2, and VHL.  The following genes were evaluated for sequence changes only: SDHA and HOXB13 c.251G>A variant only.   03/20/2018 Surgery   Right lumpectomy: Scattered microscopic foci of DCIS intermediate grade, margins negative, ER 90%, PR 10%, Tis NX stage 0    04/01/2018 Cancer Staging   Staging form: Breast, AJCC 8th Edition - Pathologic: Stage 0 (pTis (DCIS), pN0, cM0, ER+, PR+) - Signed by Gardenia Phlegm, NP on 04/01/2018    04/28/2018 -  05/26/2018 Radiation Therapy   Adjuvant radiation 1. Right breast; 15 fractions of 2.67 Gy for a total of 40.05 Gy 2. Boost; 5 fractions of 2 Gy for a total of 10 Gy     05/2018 -  Anti-estrogen oral therapy   Tamoxifen daily   Malignant neoplasm of upper-outer quadrant of left breast in female, estrogen receptor positive ([place])  01/19/2021 Initial Diagnosis   Left breast biopsy: 2:00 and 4:00: Grade 3 IDC ER 40% weak, PR 0%, Ki-67 40%, HER2 2+ equivocal by IHC, FISH negative Lymph node biopsy: Benign   01/19/2021 Cancer Staging   Staging form: Breast, AJCC 8th Edition - Clinical stage from 01/19/2021: Stage IIIA (cT2, cN1, cM0, G3, ER+, PR-, HER2-) - Signed by Gardenia Phlegm, NP on 05/09/2021 Stage prefix: Initial diagnosis Histologic grading system: 3 grade system    02/12/2021 Breast MRI   Breast MRI 02/12/2021: 4 suspicious masses within the left breast consistent with multifocal, multicentric disease 2 of the 4 masses have been biopsied (2 cm, 1.3 cm, 1.2 cm, 1.7 cm) for mildly prominent level 1 lymph nodes are identified   03/14/2021 -  Chemotherapy   Patient is on Treatment Plan : BREAST ADJUVANT DOSE DENSE AC q14d / PACLitaxel q7d        CHIEF COMPLIANT:  Left breast cancer  on cycle 7 Taxol (discontinuing chemo today)    INTERVAL HISTORY: Jasmine Maddox is a 61 y.o. with above-mentioned history of left breast cancer. She presents to the clinic today for a follow-up. States that she has some itching mainly in the morning and at night. Denies rash and fever. States that she has dry nose. Itching is quite profound and it is  making her very uncomfortable.  Only ice appears to be helping her.   ALLERGIES:  has No Known Allergies.  MEDICATIONS:  Current Outpatient Medications  Medication Sig Dispense Refill   ALPRAZolam (XANAX) 0.25 MG tablet Take 1 tablet (0.25 mg total) by mouth 2 (two) times daily as needed for anxiety. 30 tablet 1   chlorhexidine (PERIDEX) 0.12 %  solution 15 mLs 2 (two) times daily.     lidocaine-prilocaine (EMLA) cream Apply to affected area once 30 g 3   lisinopril (PRINIVIL,ZESTRIL) 20 MG tablet Take 20 mg by mouth daily.     mupirocin ointment (BACTROBAN) 2 % Apply 1 application. topically daily. 22 g 0   ondansetron (ZOFRAN) 8 MG tablet Take 1 tablet (8 mg total) by mouth 2 (two) times daily as needed. Start on the third day after chemotherapy. 30 tablet 1   prochlorperazine (COMPAZINE) 10 MG tablet Take 1 tablet (10 mg total) by mouth every 6 (six) hours as needed (Nausea or vomiting). 30 tablet 1   tamoxifen (NOLVADEX) 20 MG tablet Take 20 mg by mouth daily.     No current facility-administered medications for this visit.    PHYSICAL EXAMINATION: ECOG PERFORMANCE STATUS: 1 - Symptomatic but completely ambulatory  Vitals:   06/21/21 1114  BP: 138/81  Pulse: 80  Resp: 16  Temp: 97.7 F (36.5 C)  SpO2: 99%   Filed Weights   06/21/21 1114  Weight: 139 lb 12.8 oz (63.4 kg)      LABORATORY DATA:  I have reviewed the data as listed    Latest Ref Rng & Units 06/21/2021   11:07 AM 06/14/2021   11:45 AM 06/07/2021    1:37 PM  CMP  Glucose 70 - 99 mg/dL 122   105   98    BUN 6 - 20 mg/dL '9   10   12    ' Creatinine 0.44 - 1.00 mg/dL 0.64   0.65   0.71    Sodium 135 - 145 mmol/L 137   137   136    Potassium 3.5 - 5.1 mmol/L 3.5   3.7   3.7    Chloride 98 - 111 mmol/L 103   103   100    CO2 22 - 32 mmol/L '29   28   30    ' Calcium 8.9 - 10.3 mg/dL 9.4   8.8   9.2    Total Protein 6.5 - 8.1 g/dL 6.8   7.0   7.3    Total Bilirubin 0.3 - 1.2 mg/dL 0.4   0.4   0.5    Alkaline Phos 38 - 126 U/L 48   53   52    AST 15 - 41 U/L '18   29   16    ' ALT 0 - 44 U/L '20   25   18      ' Lab Results  Component Value Date   WBC 3.5 (L) 06/21/2021   HGB 9.0 (L) 06/21/2021   HCT 27.1 (L) 06/21/2021   MCV 93.8 06/21/2021   PLT 264 06/21/2021   NEUTROABS 2.5 06/21/2021    ASSESSMENT & PLAN:  Malignant neoplasm of upper-outer quadrant  of left breast in female, estrogen receptor positive ([place]) 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, PR 0%, Ki-67 40%, HER2 2+ equivocal by IHC, FISH negative Lymph node biopsy: Benign   Breast MRI 02/12/2021: 4 suspicious masses within the left breast consistent with multifocal, multicentric disease 2  of the 4 masses have been biopsied (2 cm, 1.3 cm, 1.2 cm, 1.7 cm) for mildly prominent level 1 lymph nodes are identified   Treatment plan: 1.  Neoadjuvant chemotherapy with dose dense Adriamycin Cytoxan followed by Taxol   2. mastectomy with sentinel lymph node biopsy 3.  Adjuvant radiation 4.  Followed by adjuvant antiestrogen therapy ------------------------------------------------------------------------------------------ Current Treatment: Completed 4 cycles of dose dense Adriamycin and Cytoxan, today cycle 7 Taxol Chemo Toxicities: Severe anxiety:  Much improved. Mild nausea: Resolved Chemotherapy-induced anemia: Monitoring closely today's hemoglobin is 9 Fatigue as a result of chemotherapy Body aches and pains 6. chemo-induced neuropathy: Her symptoms are severe itching of her feet which can be quite severe. Based on the severity of the itching, recommended discontinuation of further chemotherapy.  We will set her up for a breast MRI and follow-up with Dr. Donne Hazel to discuss surgical options.   RTC after surgery to discuss the final pathology report.   Orders Placed This Encounter  Procedures   MR BREAST BILATERAL W WO CONTRAST INC CAD    Standing Status:   Future    Standing Expiration Date:   06/22/2022    Order Specific Question:   If indicated for the ordered procedure, I authorize the administration of contrast media per Radiology protocol    Answer:   Yes    Order Specific Question:   What is the patient's sedation requirement?    Answer:   No Sedation    Order Specific Question:   Does the patient have a pacemaker or implanted devices?     Answer:   No    Order Specific Question:   Preferred imaging location?    Answer:   [facility] (table limit - 550 lbs)   The patient has a good understanding of the overall plan. she agrees with it. she will call with any problems that may develop before the next visit here. Total time spent: 30 mins including face to face time and time spent for planning, charting and co-ordination of care   Harriette Ohara, MD 06/21/21    I Gardiner Coins am scribing for Dr. Lindi Adie  I have reviewed the above documentation for accuracy and completeness, and I agree with the above.

## 2021-06-21 NOTE — Assessment & Plan Note (Addendum)
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, PR 0%, Ki-67 40%, HER2 2+ equivocal by IHC, FISH negative Lymph node biopsy: Benign  Breast MRI 02/12/2021: 4 suspicious masses within the left breast consistent with multifocal, multicentric disease 2 of the 4 masses have been biopsied (2 cm, 1.3 cm, 1.2 cm, 1.7 cm) for mildly prominent level 1 lymph nodes are identified  Treatment plan: 1.Neoadjuvant chemotherapy with dose dense Adriamycin Cytoxan followed by Taxol  2.mastectomywith sentinel lymph node biopsy 3.Adjuvant radiation 4.Followed by adjuvant antiestrogen therapy ------------------------------------------------------------------------------------------ Current Treatment:Completed 4 cycles ofdose denseAdriamycin and Cytoxan, today cycle 7 Taxol Chemo Toxicities: 1. Severe anxiety:Much improved. 2. Mild nausea:Resolved 3. Chemotherapy-induced anemia: Monitoring closely today's hemoglobin is9 4. Fatigue as a result of chemotherapy 5. Body aches and pains 6. chemo-induced neuropathy: Her symptoms are severe itching of her feet which can be quite severe. Based on the severity of the itching, recommended discontinuation of further chemotherapy.  We will set her up for a breast MRI and follow-up with Dr. Donne Hazel to discuss surgical options.  RTC after surgery to discuss the final pathology report.

## 2021-06-22 ENCOUNTER — Telehealth: Payer: Self-pay | Admitting: Hematology and Oncology

## 2021-06-22 NOTE — Telephone Encounter (Signed)
Left message for patient with upcoming MRI appointment advised if it needed to be changed I gave her centralized scheduling's number

## 2021-06-25 ENCOUNTER — Encounter: Payer: Self-pay | Admitting: *Deleted

## 2021-06-27 ENCOUNTER — Inpatient Hospital Stay: Payer: BC Managed Care – PPO

## 2021-06-27 MED FILL — Dexamethasone Sodium Phosphate Inj 100 MG/10ML: INTRAMUSCULAR | Qty: 1 | Status: AC

## 2021-06-28 ENCOUNTER — Inpatient Hospital Stay: Payer: BC Managed Care – PPO

## 2021-06-28 ENCOUNTER — Ambulatory Visit (HOSPITAL_COMMUNITY)
Admission: RE | Admit: 2021-06-28 | Discharge: 2021-06-28 | Disposition: A | Discharge status: Home / Self Care | Payer: BC Managed Care – PPO | Source: Ambulatory Visit | Attending: Hematology and Oncology | Admitting: Hematology and Oncology

## 2021-06-28 DIAGNOSIS — C50412 Malignant neoplasm of upper-outer quadrant of left female breast: Secondary | ICD-10-CM | POA: Insufficient documentation

## 2021-06-28 DIAGNOSIS — Z170 Estrogen receptor positive status [ER+]: Secondary | ICD-10-CM | POA: Diagnosis not present

## 2021-06-28 DIAGNOSIS — C50919 Malignant neoplasm of unspecified site of unspecified female breast: Secondary | ICD-10-CM | POA: Diagnosis not present

## 2021-06-28 IMAGING — MR MR BREAST BILAT WO/W CM
13 of 23 series · 28 of 48 positions shown · IV contrast (6 ML GADAVIST)
Comparison: Breast MRI dated [DATE].

CLINICAL DATA: Breast cancer, staging prior to preoperative
systemic therapy.

EXAM:
BILATERAL BREAST MRI WITH AND WITHOUT CONTRAST
TECHNIQUE: Multiplanar, multisequence MR images of both breasts were obtained
prior to and following the intravenous administration of 6 ml of
Gadavist

[Series 5: T2 · axial · 3.0mm · 0.78mm/px · z∈[-73,+104]mm · 2 of 60 slices shown]
[im 1/60]
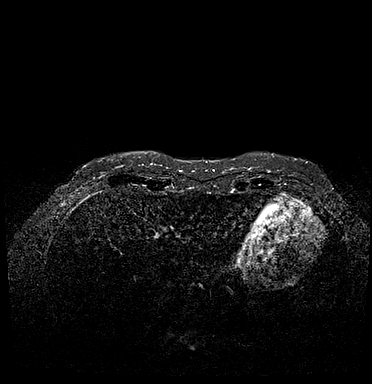
[im 60/60]
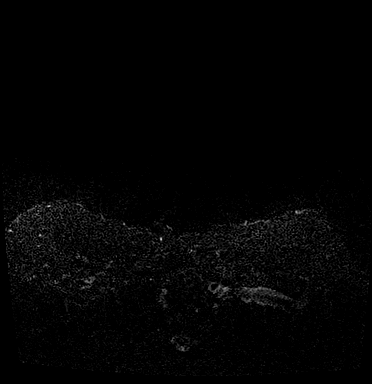

[Series 6: T1 fat-sat · axial · 1.2mm · 0.67mm/px · z∈[-80,+111]mm · 3 of 160 slices shown (1 of 8)]
[im 1/160]
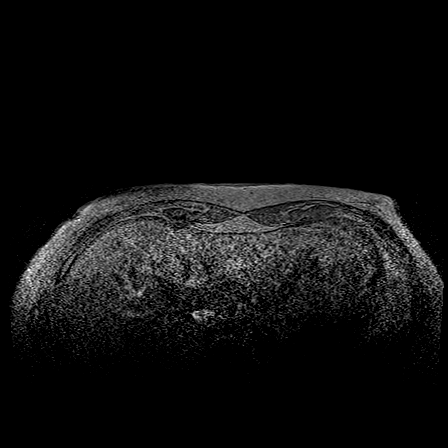
[im 80/160]
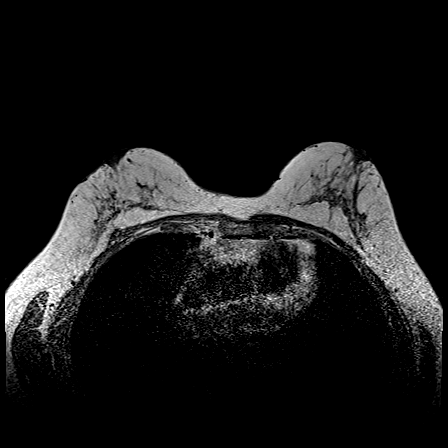
[im 160/160]
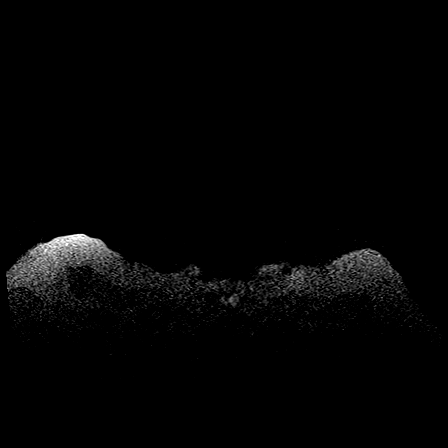

[Series 7: T1 fat-sat · axial · 1.2mm · 0.72mm/px · z∈[-80,+111]mm · 3 of 160 slices shown (2 of 8)]
[im 1/160]
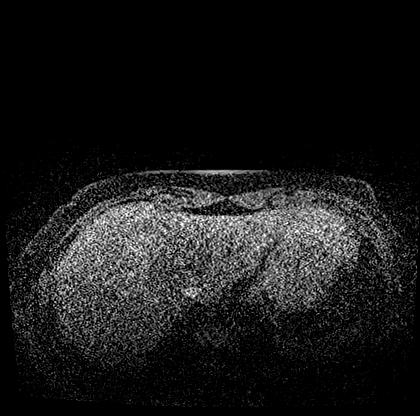
[im 80/160]
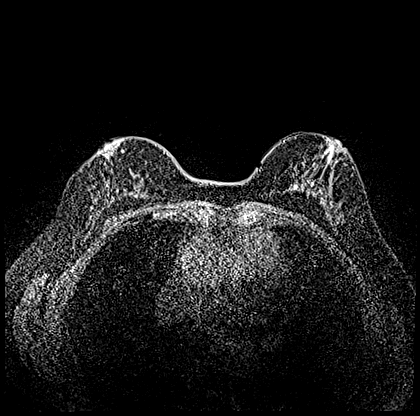
[im 160/160]
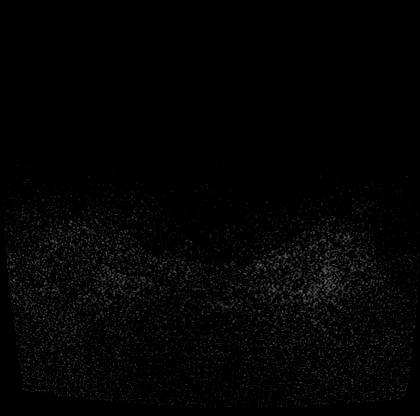

[Series 8: T1 fat-sat post-contrast · axial · 1.2mm · 0.72mm/px · z∈[-80,+111]mm · 3 of 160 slices shown (1 of 4)]
[im 1/160]
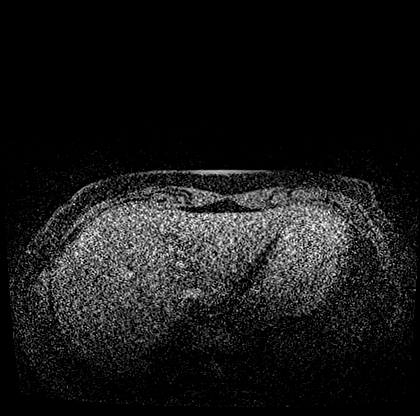
[im 80/160]
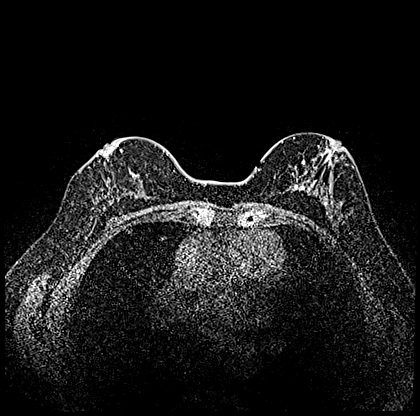
[im 160/160]
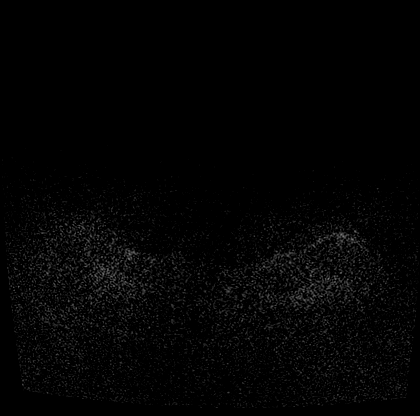

[Series 9: T1 fat-sat post-contrast · axial · 1.2mm · 0.72mm/px · z∈[-80,+111]mm · 3 of 160 slices shown (2 of 4)]
[im 1/160]
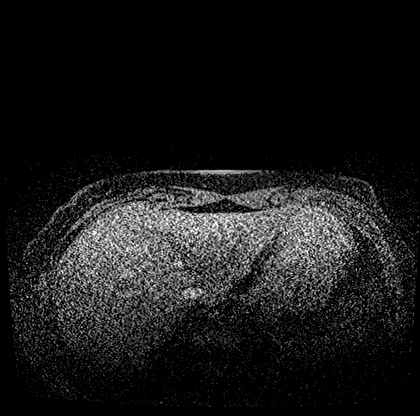
[im 80/160]
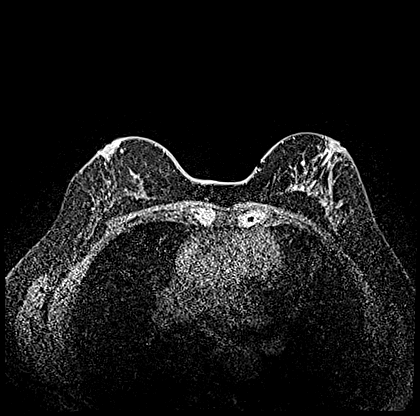
[im 160/160]
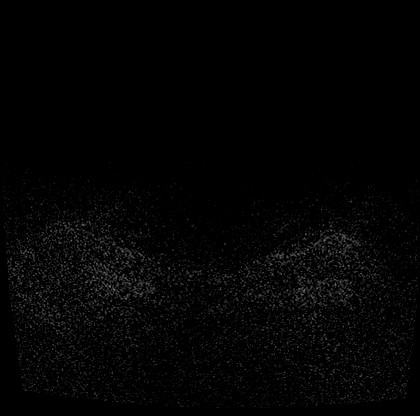

[Series 10: T1 fat-sat · axial · 1.2mm · 0.72mm/px · z∈[-80,+111]mm · 2 of 160 slices shown (3 of 8)]
[im 1/160]
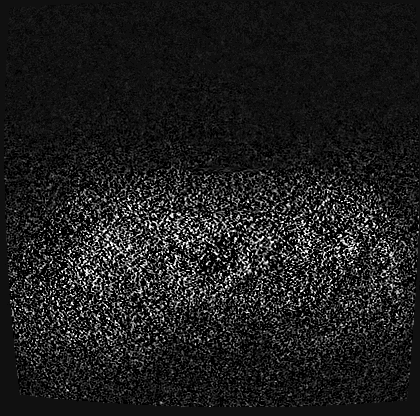
[im 160/160]
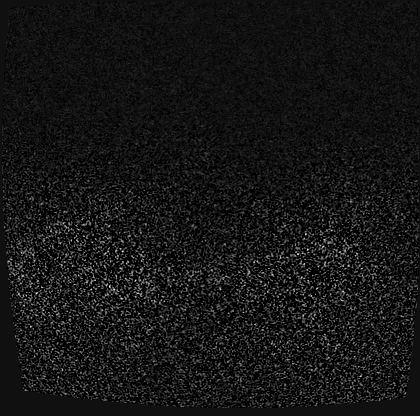

[Series 11: T1 fat-sat · coronal · 300.0mm · 0.72mm/px · 1 of 3 slices shown (4 of 8)]
[im 1/3]
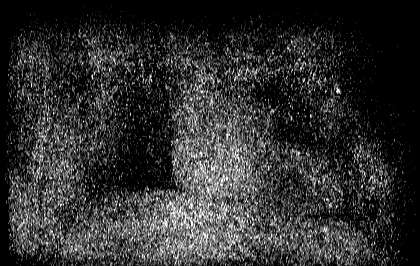

[Series 12: T1 fat-sat · axial · 192.0mm · 0.72mm/px · 1 of 3 slices shown (5 of 8)]
[im 1/3]
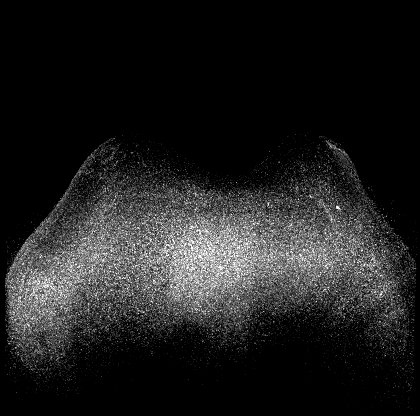

[Series 13: T1 fat-sat post-contrast · axial · 1.2mm · 0.72mm/px · z∈[-80,+111]mm · 2 of 160 slices shown (3 of 4)]
[im 1/160]
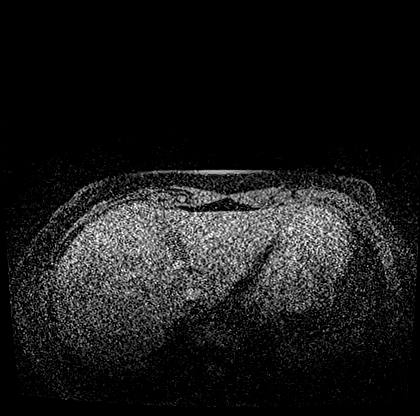
[im 160/160]
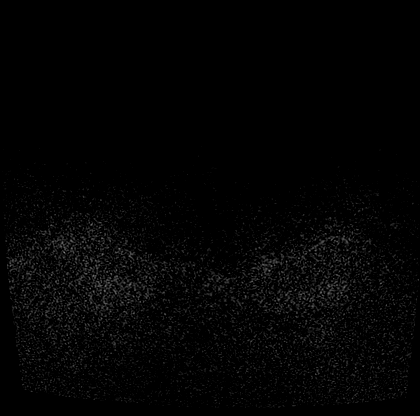

[Series 14: T1 fat-sat · axial · 1.2mm · 0.72mm/px · z∈[-80,+111]mm · 2 of 160 slices shown (6 of 8)]
[im 1/160]
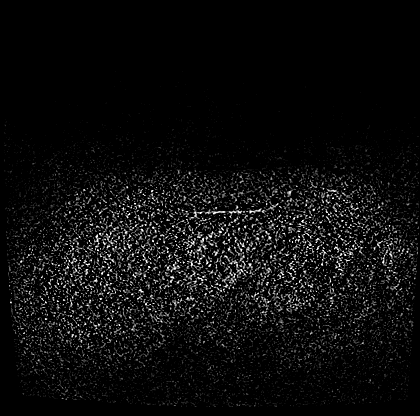
[im 160/160]
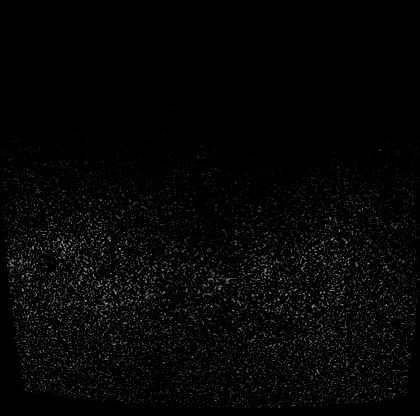

[Series 15: T1 fat-sat post-contrast · axial · 1.2mm · 0.72mm/px · z∈[-80,+111]mm · 2 of 160 slices shown (4 of 4)]
[im 1/160]
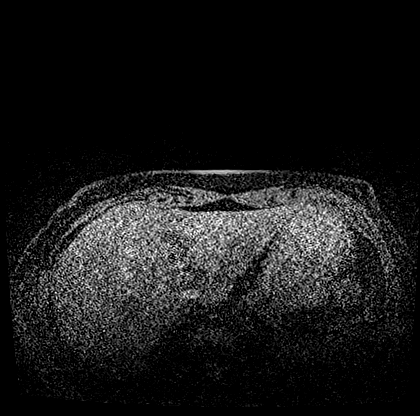
[im 160/160]
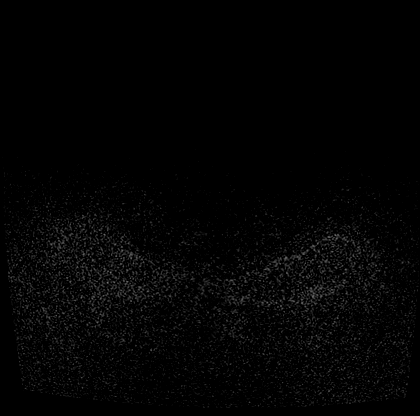

[Series 16: T1 fat-sat · axial · 1.2mm · 0.72mm/px · z∈[-80,+111]mm · 2 of 160 slices shown (7 of 8)]
[im 1/160]
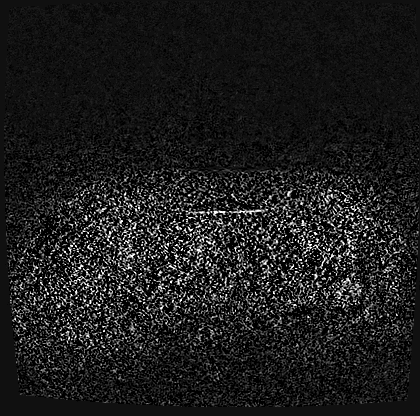
[im 160/160]
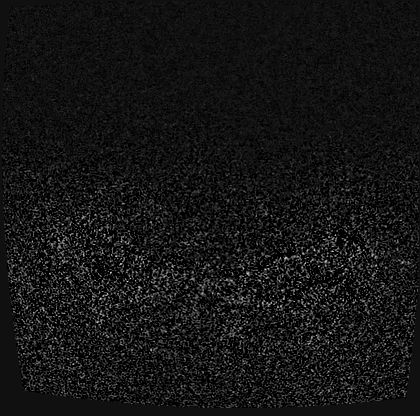

[Series 18: T1 fat-sat · axial · 1.2mm · 0.84mm/px · z∈[-86,+86]mm · 2 of 144 slices shown (8 of 8)]
[im 1/144]
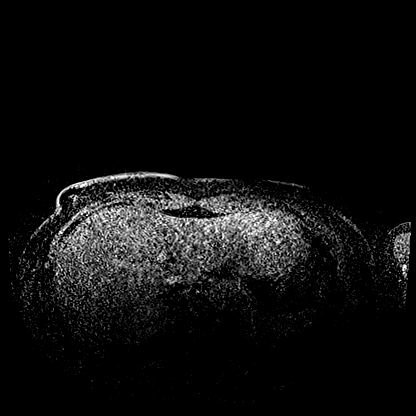
[im 144/144]
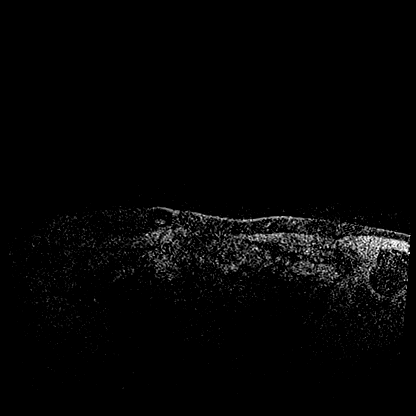

[28 of 48 positions shown; findings below may reference images not displayed]

Three-dimensional MR images were rendered by post-processing of the
original MR data on an independent workstation. The
three-dimensional MR images were interpreted, and findings are
reported in the following complete MRI report for this study. Three
dimensional images were evaluated at the independent interpreting
workstation using the DynaCAD thin client.
FINDINGS: Breast composition: c. Heterogeneous fibroglandular tissue.

Background parenchymal enhancement: Mild

Right breast: Stable postsurgical changes of the RIGHT breast. No
suspicious enhancing mass, suspicious non-mass enhancement or
secondary signs of malignancy are identified within the RIGHT
breast.

Left breast: 2 biopsy clip artifact sites are stable in position
within the upper-outer quadrant and lower outer quadrant of the LEFT
breast. On the previous breast MRI, there were 4 irregular enhancing
masses identified within the upper-outer, lower outer and upper
inner quadrants of the LEFT breast, 2 of these masses corresponding
to the 2 biopsied masses with associated biopsy clips.

On the current study, there is now only mild residual non-mass
enhancement at these 4 sites within the LEFT breast (best seen on
series 24 and series 101; images 73, 74, 78, 79 and 111), indicating
a good response to interval treatment.

No new mass or non-mass enhancement is seen within the LEFT breast.

Lymph nodes: There are now no enlarged or morphologically abnormal
lymph nodes identified within the LEFT axilla. Biopsy clip is seen
within 1 of the LEFT axillary lymph nodes.

Ancillary findings:  None.
IMPRESSION: 1. Near complete resolution of the 4 previously described enhancing
masses seen within the LEFT breast (upper-outer quadrant, lower
outer quadrant and upper inner quadrant), now with only mild
residual non-mass enhancement in these 4 areas (2 of which were
biopsied), indicating a GOOD response to interval treatment.
2. No new findings within either breast. No evidence of malignancy
within the RIGHT breast.
3. Now no enlarged or morphologically abnormal lymph nodes
identified. Biopsy clip is seen within 1 of the LEFT axillary lymph
nodes.

RECOMMENDATION:
Per treatment plan for patient's biopsy-proven malignancy in the
LEFT breast.

BI-RADS CATEGORY  6: Known biopsy-proven malignancy.

## 2021-06-28 MED ORDER — GADOBUTROL 1 MMOL/ML IV SOLN
6.0000 mL | Freq: Once | INTRAVENOUS | Status: AC | PRN
Start: 2021-06-28 — End: 2021-06-28
  Administered 2021-06-28: 6 mL via INTRAVENOUS

## 2021-06-29 ENCOUNTER — Other Ambulatory Visit: Payer: Self-pay | Admitting: General Surgery

## 2021-06-29 ENCOUNTER — Encounter: Payer: Self-pay | Admitting: *Deleted

## 2021-06-29 DIAGNOSIS — Z170 Estrogen receptor positive status [ER+]: Secondary | ICD-10-CM | POA: Diagnosis not present

## 2021-06-29 DIAGNOSIS — C50412 Malignant neoplasm of upper-outer quadrant of left female breast: Secondary | ICD-10-CM | POA: Diagnosis not present

## 2021-07-04 DIAGNOSIS — Z0000 Encounter for general adult medical examination without abnormal findings: Secondary | ICD-10-CM | POA: Diagnosis not present

## 2021-07-04 DIAGNOSIS — I10 Essential (primary) hypertension: Secondary | ICD-10-CM | POA: Diagnosis not present

## 2021-07-04 DIAGNOSIS — Z13220 Encounter for screening for lipoid disorders: Secondary | ICD-10-CM | POA: Diagnosis not present

## 2021-07-04 DIAGNOSIS — E2839 Other primary ovarian failure: Secondary | ICD-10-CM | POA: Diagnosis not present

## 2021-07-04 DIAGNOSIS — M810 Age-related osteoporosis without current pathological fracture: Secondary | ICD-10-CM | POA: Diagnosis not present

## 2021-07-06 ENCOUNTER — Encounter: Payer: Self-pay | Admitting: *Deleted

## 2021-07-06 ENCOUNTER — Telehealth: Payer: Self-pay | Admitting: Hematology and Oncology

## 2021-07-06 NOTE — Telephone Encounter (Signed)
Scheduled appointment per 6/16 scheduling message. Patient is aware.

## 2021-07-13 DIAGNOSIS — M8589 Other specified disorders of bone density and structure, multiple sites: Secondary | ICD-10-CM | POA: Diagnosis not present

## 2021-07-16 ENCOUNTER — Encounter (HOSPITAL_BASED_OUTPATIENT_CLINIC_OR_DEPARTMENT_OTHER): Payer: Self-pay | Admitting: General Surgery

## 2021-07-16 ENCOUNTER — Other Ambulatory Visit: Payer: Self-pay

## 2021-07-18 DIAGNOSIS — C50912 Malignant neoplasm of unspecified site of left female breast: Secondary | ICD-10-CM | POA: Diagnosis not present

## 2021-07-19 DIAGNOSIS — Z1211 Encounter for screening for malignant neoplasm of colon: Secondary | ICD-10-CM | POA: Diagnosis not present

## 2021-07-20 ENCOUNTER — Encounter (HOSPITAL_BASED_OUTPATIENT_CLINIC_OR_DEPARTMENT_OTHER)
Admission: RE | Admit: 2021-07-20 | Discharge: 2021-07-20 | Disposition: A | Discharge status: Home / Self Care | Payer: BC Managed Care – PPO | Source: Ambulatory Visit | Attending: General Surgery | Admitting: General Surgery

## 2021-07-20 DIAGNOSIS — C50412 Malignant neoplasm of upper-outer quadrant of left female breast: Secondary | ICD-10-CM | POA: Diagnosis not present

## 2021-07-20 DIAGNOSIS — Z170 Estrogen receptor positive status [ER+]: Secondary | ICD-10-CM | POA: Insufficient documentation

## 2021-07-20 DIAGNOSIS — Z01812 Encounter for preprocedural laboratory examination: Secondary | ICD-10-CM | POA: Insufficient documentation

## 2021-07-20 LAB — BASIC METABOLIC PANEL
Anion gap: 8 (ref 5–15)
BUN: 12 mg/dL (ref 6–20)
CO2: 27 mmol/L (ref 22–32)
Calcium: 9.5 mg/dL (ref 8.9–10.3)
Chloride: 105 mmol/L (ref 98–111)
Creatinine, Ser: 0.75 mg/dL (ref 0.44–1.00)
GFR, Estimated: >60 mL/min (ref >60)
Glucose, Bld: 98 mg/dL (ref 70–99)
Potassium: 3.9 mmol/L (ref 3.5–5.1)
Sodium: 140 mmol/L (ref 135–145)

## 2021-07-20 MED ORDER — ENSURE PRE-SURGERY PO LIQD: 296.0000 mL | Freq: Once | ORAL | Status: DC

## 2021-07-20 NOTE — Progress Notes (Signed)

## 2021-07-27 ENCOUNTER — Other Ambulatory Visit: Payer: Self-pay

## 2021-07-27 ENCOUNTER — Inpatient Hospital Stay: Payer: BC Managed Care – PPO | Attending: Hematology and Oncology | Admitting: Hematology and Oncology

## 2021-07-27 DIAGNOSIS — N951 Menopausal and female climacteric states: Secondary | ICD-10-CM | POA: Diagnosis not present

## 2021-07-27 DIAGNOSIS — G620 Drug-induced polyneuropathy: Secondary | ICD-10-CM | POA: Diagnosis not present

## 2021-07-27 DIAGNOSIS — C50412 Malignant neoplasm of upper-outer quadrant of left female breast: Secondary | ICD-10-CM | POA: Insufficient documentation

## 2021-07-27 DIAGNOSIS — Z79899 Other long term (current) drug therapy: Secondary | ICD-10-CM | POA: Diagnosis not present

## 2021-07-27 DIAGNOSIS — Z170 Estrogen receptor positive status [ER+]: Secondary | ICD-10-CM | POA: Diagnosis not present

## 2021-07-27 DIAGNOSIS — D0511 Intraductal carcinoma in situ of right breast: Secondary | ICD-10-CM | POA: Insufficient documentation

## 2021-07-27 DIAGNOSIS — T451X5A Adverse effect of antineoplastic and immunosuppressive drugs, initial encounter: Secondary | ICD-10-CM | POA: Diagnosis not present

## 2021-07-27 NOTE — Assessment & Plan Note (Signed)
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, PR 0%, Ki-67 40%, HER2 2+ equivocal by IHC, FISH negative Lymph node biopsy: Benign  Breast MRI 02/12/2021: 4 suspicious masses within the left breast consistent with multifocal, multicentric disease 2 of the 4 masses have been biopsied (2 cm, 1.3 cm, 1.2 cm, 1.7 cm) for mildly prominent level 1 lymph nodes are identified  Treatment plan: 1.Neoadjuvant chemotherapy with dose dense Adriamycin Cytoxan followed by Taxol weekly x6 discontinued because of neuropathy 2.mastectomywith sentinel lymph node biopsy 3.Followed by adjuvant antiestrogen therapy ------------------------------------------------------------------------------------------ Breast MRI 06/29/2021: Near complete resolution of the 4 previously described enhancing masses in the left breast.  No enlarged lymph nodes  Patient has surgery scheduled for 07/31/2021. We will see her back on 08/14/2021 to discuss the final pathology report.  She is really praying so that she does not require radiation. Return to clinic after surgery to discuss antiestrogen therapy.

## 2021-07-27 NOTE — Progress Notes (Signed)
Patient Care Team: [name], PA as PCP - General (Family Medicine) [name], MD as Consulting Physician (General Surgery) [name], MD as Consulting Physician (Hematology and Oncology) [name], MD as Consulting Physician (Radiation Oncology) Rockwell Germany, RN as Oncology Nurse Navigator [name], RN as Oncology Nurse Navigator  DIAGNOSIS:  Encounter Diagnosis  Name Primary?   Malignant neoplasm of upper-outer quadrant of left breast in female, estrogen receptor positive ([place])     SUMMARY OF ONCOLOGIC HISTORY: Oncology History  Ductal carcinoma in situ (DCIS) of right breast  01/30/2018 Mammogram   Diagnostic Mammogram  0.4cm cluster of calcifications, 12 o'clock position, 4cm fn, right breast   02/12/2018 Initial Diagnosis   Screening detected right breast calcifications 4 mm size UOQ 11:30 position stereotactic biopsy revealed high-grade DCIS ER 90%, PR 10%, Tis NX stage 0   02/18/2018 Genetic Testing   Negative.  Genes tested include: ATM, BRCA1, BRCA2, CDH1, CHEK2, PALB2, PTEN, STK11 and TP53; APC, ATM, AXIN2, BARD1, BMPR1A, BRCA1, BRCA2, BRIP1, CDH1, CDKN2A (p14ARF), CDKN2A (p16INK4a), CKD4, CHEK2, CTNNA1, DICER1, EPCAM (Deletion/duplication testing only), GREM1 (promoter region deletion/duplication testing only), KIT, MEN1, MLH1, MSH2, MSH3, MSH6, MUTYH, NBN, NF1, NHTL1, PALB2, PDGFRA, PMS2, POLD1, POLE, PTEN, RAD50, RAD51C, RAD51D, SDHB, SDHC, SDHD, SMAD4, SMARCA4. STK11, TP53, TSC1, TSC2, and VHL.  The following genes were evaluated for sequence changes only: SDHA and HOXB13 c.251G>A variant only.   03/20/2018 Surgery   Right lumpectomy: Scattered microscopic foci of DCIS intermediate grade, margins negative, ER 90%, PR 10%, Tis NX stage 0   04/01/2018 Cancer Staging   Staging form: Breast, AJCC 8th Edition - Pathologic: Stage 0 (pTis (DCIS), pN0, cM0, ER+, PR+) - Signed by Gardenia Phlegm, NP on 04/01/2018   04/28/2018 - 05/26/2018  Radiation Therapy   Adjuvant radiation 1. Right breast; 15 fractions of 2.67 Gy for a total of 40.05 Gy 2. Boost; 5 fractions of 2 Gy for a total of 10 Gy     05/2018 -  Anti-estrogen oral therapy   Tamoxifen daily   Malignant neoplasm of upper-outer quadrant of left breast in female, estrogen receptor positive ([place])  01/19/2021 Initial Diagnosis   Left breast biopsy: 2:00 and 4:00: Grade 3 IDC ER 40% weak, PR 0%, Ki-67 40%, HER2 2+ equivocal by IHC, FISH negative Lymph node biopsy: Benign   01/19/2021 Cancer Staging   Staging form: Breast, AJCC 8th Edition - Clinical stage from 01/19/2021: Stage IIIA (cT2, cN1, cM0, G3, ER+, PR-, HER2-) - Signed by Gardenia Phlegm, NP on 05/09/2021 Stage prefix: Initial diagnosis Histologic grading system: 3 grade system   02/12/2021 Breast MRI   Breast MRI 02/12/2021: 4 suspicious masses within the left breast consistent with multifocal, multicentric disease 2 of the 4 masses have been biopsied (2 cm, 1.3 cm, 1.2 cm, 1.7 cm) for mildly prominent level 1 lymph nodes are identified   03/14/2021 -  Chemotherapy   Patient is on Treatment Plan : BREAST ADJUVANT DOSE DENSE AC q14d / PACLitaxel q7d       CHIEF COMPLIANT: Follow up to discuss Mri  INTERVAL HISTORY: Jasmine Maddox is a 61 y.o. with above-mentioned history of left breast cancer. She presents to the clinic today for a follow-up to discuss Mri. She states that her foot is 90% better. She had some concerns about if she was going to have to have radiation.   ALLERGIES:  has No Known Allergies.  MEDICATIONS:  Current Outpatient Medications  Medication Sig Dispense Refill  ALPRAZolam (XANAX) 0.25 MG tablet Take 1 tablet (0.25 mg total) by mouth 2 (two) times daily as needed for anxiety. 30 tablet 1   chlorhexidine (PERIDEX) 0.12 % solution 15 mLs 2 (two) times daily.     lidocaine-prilocaine (EMLA) cream Apply to affected area once 30 g 3   lisinopril-hydrochlorothiazide (ZESTORETIC)  20-12.5 MG tablet Take 1 tablet by mouth daily.     mupirocin ointment (BACTROBAN) 2 % Apply 1 application. topically daily. 22 g 0   ondansetron (ZOFRAN) 8 MG tablet Take 1 tablet (8 mg total) by mouth 2 (two) times daily as needed. Start on the third day after chemotherapy. 30 tablet 1   prochlorperazine (COMPAZINE) 10 MG tablet Take 1 tablet (10 mg total) by mouth every 6 (six) hours as needed (Nausea or vomiting). 30 tablet 1   tamoxifen (NOLVADEX) 20 MG tablet Take 20 mg by mouth daily.     No current facility-administered medications for this visit.   Facility-Administered Medications Ordered in Other Visits  Medication Dose Route Frequency Provider Last Rate Last Admin   feeding supplement (ENSURE PRE-SURGERY) liquid 296 mL  296 mL Oral Once Rolm Bookbinder, MD        PHYSICAL EXAMINATION: ECOG PERFORMANCE STATUS: 1 - Symptomatic but completely ambulatory  Vitals:   07/27/21 1107  BP: (!) 145/83  Pulse: 71  Resp: 16  Temp: 98.1 F (36.7 C)  SpO2: 100%   Filed Weights   07/27/21 1107  Weight: 136 lb 12.8 oz (62.1 kg)      LABORATORY DATA:  I have reviewed the data as listed    Latest Ref Rng & Units 07/20/2021    8:18 AM 06/21/2021   11:07 AM 06/14/2021   11:45 AM  CMP  Glucose 70 - 99 mg/dL 98  122  105   BUN 6 - 20 mg/dL '12  9  10   ' Creatinine 0.44 - 1.00 mg/dL 0.75  0.64  0.65   Sodium 135 - 145 mmol/L 140  137  137   Potassium 3.5 - 5.1 mmol/L 3.9  3.5  3.7   Chloride 98 - 111 mmol/L 105  103  103   CO2 22 - 32 mmol/L '27  29  28   ' Calcium 8.9 - 10.3 mg/dL 9.5  9.4  8.8   Total Protein 6.5 - 8.1 g/dL  6.8  7.0   Total Bilirubin 0.3 - 1.2 mg/dL  0.4  0.4   Alkaline Phos 38 - 126 U/L  48  53   AST 15 - 41 U/L  18  29   ALT 0 - 44 U/L  20  25     Lab Results  Component Value Date   WBC 3.5 (L) 06/21/2021   HGB 9.0 (L) 06/21/2021   HCT 27.1 (L) 06/21/2021   MCV 93.8 06/21/2021   PLT 264 06/21/2021   NEUTROABS 2.5 06/21/2021    ASSESSMENT & PLAN:   Malignant neoplasm of upper-outer quadrant of left breast in female, estrogen receptor positive ([place]) 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  Neoadjuvant chemotherapy with dose dense Adriamycin Cytoxan  followed by Taxol weekly x6 discontinued because of neuropathy 2. mastectomy with sentinel lymph node biopsy 3. Followed by adjuvant antiestrogen therapy ------------------------------------------------------------------------------------------ Breast MRI 06/29/2021: Near complete resolution of the 4 previously described enhancing masses in the left breast.  No enlarged lymph nodes  Patient has surgery scheduled for 07/31/2021. We will see her back on 08/14/2021 to discuss the final pathology report.  Chemo-induced peripheral neuropathy: Marked improvement 90% improvement.  She is really praying so that she does not require radiation. Return to clinic after surgery to discuss antiestrogen therapy.    No orders of the defined types were placed in this encounter.  The patient has a good understanding of the overall plan. she agrees with it. she will call with any problems that may develop before the next visit here. Total time spent: 30 mins including face to face time and time spent for planning, charting and co-ordination of care   Harriette Ohara, MD 07/27/21

## 2021-07-31 ENCOUNTER — Ambulatory Visit (HOSPITAL_BASED_OUTPATIENT_CLINIC_OR_DEPARTMENT_OTHER): Payer: BC Managed Care – PPO | Admitting: Anesthesiology

## 2021-07-31 ENCOUNTER — Other Ambulatory Visit: Payer: Self-pay

## 2021-07-31 ENCOUNTER — Encounter (HOSPITAL_BASED_OUTPATIENT_CLINIC_OR_DEPARTMENT_OTHER): Payer: Self-pay | Admitting: General Surgery

## 2021-07-31 ENCOUNTER — Observation Stay (HOSPITAL_BASED_OUTPATIENT_CLINIC_OR_DEPARTMENT_OTHER)
Admission: RE | Admit: 2021-07-31 | Discharge: 2021-08-01 | Disposition: A | Discharge status: Home / Self Care | Payer: BC Managed Care – PPO | Attending: General Surgery | Admitting: General Surgery

## 2021-07-31 ENCOUNTER — Encounter (HOSPITAL_BASED_OUTPATIENT_CLINIC_OR_DEPARTMENT_OTHER)
Admission: RE | Disposition: A | Discharge status: Home / Self Care | Payer: Self-pay | Source: Home / Self Care | Attending: General Surgery

## 2021-07-31 DIAGNOSIS — C50412 Malignant neoplasm of upper-outer quadrant of left female breast: Secondary | ICD-10-CM | POA: Diagnosis not present

## 2021-07-31 DIAGNOSIS — Z170 Estrogen receptor positive status [ER+]: Secondary | ICD-10-CM | POA: Insufficient documentation

## 2021-07-31 DIAGNOSIS — I10 Essential (primary) hypertension: Secondary | ICD-10-CM | POA: Insufficient documentation

## 2021-07-31 DIAGNOSIS — Z803 Family history of malignant neoplasm of breast: Secondary | ICD-10-CM | POA: Insufficient documentation

## 2021-07-31 DIAGNOSIS — N6032 Fibrosclerosis of left breast: Secondary | ICD-10-CM | POA: Diagnosis not present

## 2021-07-31 DIAGNOSIS — C50912 Malignant neoplasm of unspecified site of left female breast: Secondary | ICD-10-CM | POA: Diagnosis not present

## 2021-07-31 DIAGNOSIS — N6012 Diffuse cystic mastopathy of left breast: Secondary | ICD-10-CM | POA: Diagnosis not present

## 2021-07-31 DIAGNOSIS — N6022 Fibroadenosis of left breast: Secondary | ICD-10-CM | POA: Diagnosis not present

## 2021-07-31 DIAGNOSIS — D630 Anemia in neoplastic disease: Secondary | ICD-10-CM | POA: Diagnosis not present

## 2021-07-31 DIAGNOSIS — Z79899 Other long term (current) drug therapy: Secondary | ICD-10-CM | POA: Insufficient documentation

## 2021-07-31 DIAGNOSIS — G8918 Other acute postprocedural pain: Secondary | ICD-10-CM | POA: Diagnosis not present

## 2021-07-31 DIAGNOSIS — Z452 Encounter for adjustment and management of vascular access device: Secondary | ICD-10-CM | POA: Diagnosis not present

## 2021-07-31 DIAGNOSIS — R920 Mammographic microcalcification found on diagnostic imaging of breast: Secondary | ICD-10-CM | POA: Diagnosis not present

## 2021-07-31 DIAGNOSIS — Z9012 Acquired absence of left breast and nipple: Secondary | ICD-10-CM

## 2021-07-31 HISTORY — DX: Other specified postprocedural states: Z98.890

## 2021-07-31 HISTORY — PX: MASTECTOMY W/ SENTINEL NODE BIOPSY: SHX2001

## 2021-07-31 HISTORY — PX: PORT-A-CATH REMOVAL: SHX5289

## 2021-07-31 SURGERY — MASTECTOMY WITH SENTINEL LYMPH NODE BIOPSY
Anesthesia: General | Site: Chest | Laterality: Right

## 2021-07-31 MED ORDER — BUPIVACAINE LIPOSOME 1.3 % IJ SUSP
INTRAMUSCULAR | Status: DC | PRN
  Administered 2021-07-31: 10 mL via PERINEURAL

## 2021-07-31 MED ORDER — LIDOCAINE 2% (20 MG/ML) 5 ML SYRINGE
INTRAMUSCULAR | Status: DC | PRN
  Administered 2021-07-31: 60 mg via INTRAVENOUS

## 2021-07-31 MED ORDER — ACETAMINOPHEN 500 MG PO TABS
1000.0000 mg | ORAL_TABLET | ORAL | Status: AC
  Administered 2021-07-31: 1000 mg via ORAL

## 2021-07-31 MED ORDER — ALPRAZOLAM 0.25 MG PO TABS: 0.2500 mg | ORAL_TABLET | Freq: Two times a day (BID) | ORAL | Status: DC | PRN

## 2021-07-31 MED ORDER — CEFAZOLIN SODIUM-DEXTROSE 2-4 GM/100ML-% IV SOLN
INTRAVENOUS | Status: AC
  Filled 2021-07-31: qty 100

## 2021-07-31 MED ORDER — HYDROCHLOROTHIAZIDE 12.5 MG PO TABS
12.5000 mg | ORAL_TABLET | Freq: Every day | ORAL | Status: DC
  Filled 2021-07-31: qty 1

## 2021-07-31 MED ORDER — FENTANYL CITRATE (PF) 100 MCG/2ML IJ SOLN
INTRAMUSCULAR | Status: DC | PRN
  Administered 2021-07-31 (×4): 25 ug via INTRAVENOUS

## 2021-07-31 MED ORDER — ONDANSETRON HCL 4 MG/2ML IJ SOLN
4.0000 mg | Freq: Once | INTRAMUSCULAR | Status: DC | PRN
Start: 2021-07-31 — End: 2021-08-01
  Filled 2021-07-31: qty 2

## 2021-07-31 MED ORDER — BUPIVACAINE HCL (PF) 0.5 % IJ SOLN
INTRAMUSCULAR | Status: DC | PRN
  Administered 2021-07-31: 20 mL via PERINEURAL

## 2021-07-31 MED ORDER — PROPOFOL 10 MG/ML IV BOLUS
INTRAVENOUS | Status: DC | PRN
  Administered 2021-07-31: 120 mg via INTRAVENOUS

## 2021-07-31 MED ORDER — LIDOCAINE-EPINEPHRINE (PF) 1 %-1:200000 IJ SOLN
INTRAMUSCULAR | Status: AC
  Filled 2021-07-31: qty 30

## 2021-07-31 MED ORDER — TRAMADOL HCL 50 MG PO TABS: 50.0000 mg | ORAL_TABLET | Freq: Four times a day (QID) | ORAL | 0 refills | Status: DC | PRN

## 2021-07-31 MED ORDER — OXYCODONE HCL 5 MG PO TABS: 5.0000 mg | ORAL_TABLET | Freq: Once | ORAL | Status: DC | PRN

## 2021-07-31 MED ORDER — ACETAMINOPHEN 500 MG PO TABS
ORAL_TABLET | ORAL | Status: AC
  Filled 2021-07-31: qty 2

## 2021-07-31 MED ORDER — CHLORHEXIDINE GLUCONATE CLOTH 2 % EX PADS: 6.0000 | MEDICATED_PAD | Freq: Once | CUTANEOUS | Status: DC

## 2021-07-31 MED ORDER — FENTANYL CITRATE (PF) 100 MCG/2ML IJ SOLN
INTRAMUSCULAR | Status: AC
  Filled 2021-07-31: qty 2

## 2021-07-31 MED ORDER — CHLORHEXIDINE GLUCONATE CLOTH 2 % EX PADS
6.0000 | MEDICATED_PAD | Freq: Once | CUTANEOUS | Status: AC
  Administered 2021-07-31: 6 via TOPICAL

## 2021-07-31 MED ORDER — AMISULPRIDE (ANTIEMETIC) 5 MG/2ML IV SOLN: 10.0000 mg | Freq: Once | INTRAVENOUS | Status: DC | PRN

## 2021-07-31 MED ORDER — PROPOFOL 500 MG/50ML IV EMUL
INTRAVENOUS | Status: DC | PRN
  Administered 2021-07-31: 200 ug/kg/min via INTRAVENOUS

## 2021-07-31 MED ORDER — ONDANSETRON 4 MG PO TBDP: 4.0000 mg | ORAL_TABLET | Freq: Four times a day (QID) | ORAL | Status: DC | PRN

## 2021-07-31 MED ORDER — FENTANYL CITRATE (PF) 100 MCG/2ML IJ SOLN
25.0000 ug | INTRAMUSCULAR | Status: DC | PRN
  Administered 2021-07-31 (×2): 25 ug via INTRAVENOUS

## 2021-07-31 MED ORDER — SCOPOLAMINE 1 MG/3DAYS TD PT72
1.0000 | MEDICATED_PATCH | TRANSDERMAL | Status: DC
  Administered 2021-07-31: 1.5 mg via TRANSDERMAL

## 2021-07-31 MED ORDER — SCOPOLAMINE 1 MG/3DAYS TD PT72
MEDICATED_PATCH | TRANSDERMAL | Status: AC
  Filled 2021-07-31: qty 1

## 2021-07-31 MED ORDER — LISINOPRIL 20 MG PO TABS
20.0000 mg | ORAL_TABLET | Freq: Every day | ORAL | Status: DC
Start: 2021-07-31 — End: 2021-08-01
  Filled 2021-07-31: qty 1

## 2021-07-31 MED ORDER — BUPIVACAINE HCL (PF) 0.25 % IJ SOLN
INTRAMUSCULAR | Status: AC
  Filled 2021-07-31: qty 30

## 2021-07-31 MED ORDER — ONDANSETRON HCL 4 MG/2ML IJ SOLN
4.0000 mg | Freq: Four times a day (QID) | INTRAMUSCULAR | Status: DC | PRN
  Administered 2021-07-31: 4 mg via INTRAVENOUS

## 2021-07-31 MED ORDER — METHOCARBAMOL 1000 MG/10ML IJ SOLN: 500.0000 mg | Freq: Three times a day (TID) | INTRAVENOUS | Status: DC | PRN

## 2021-07-31 MED ORDER — FENTANYL CITRATE (PF) 100 MCG/2ML IJ SOLN
100.0000 ug | Freq: Once | INTRAMUSCULAR | Status: AC
  Administered 2021-07-31: 50 ug via INTRAVENOUS

## 2021-07-31 MED ORDER — MORPHINE SULFATE (PF) 4 MG/ML IV SOLN: 1.0000 mg | INTRAVENOUS | Status: DC | PRN

## 2021-07-31 MED ORDER — MAGTRACE LYMPHATIC TRACER
INTRAMUSCULAR | Status: DC | PRN
  Administered 2021-07-31: 2 mL via INTRAMUSCULAR

## 2021-07-31 MED ORDER — PHENYLEPHRINE HCL (PRESSORS) 10 MG/ML IV SOLN
INTRAVENOUS | Status: DC | PRN
  Administered 2021-07-31: 160 ug via INTRAVENOUS

## 2021-07-31 MED ORDER — OXYCODONE HCL 5 MG/5ML PO SOLN: 5.0000 mg | Freq: Once | ORAL | Status: DC | PRN

## 2021-07-31 MED ORDER — MIDAZOLAM HCL 2 MG/2ML IJ SOLN
INTRAMUSCULAR | Status: AC
  Filled 2021-07-31: qty 2

## 2021-07-31 MED ORDER — SIMETHICONE 80 MG PO CHEW: 40.0000 mg | CHEWABLE_TABLET | Freq: Four times a day (QID) | ORAL | Status: DC | PRN

## 2021-07-31 MED ORDER — CEFAZOLIN SODIUM-DEXTROSE 2-4 GM/100ML-% IV SOLN
2.0000 g | INTRAVENOUS | Status: AC
  Administered 2021-07-31: 2 g via INTRAVENOUS

## 2021-07-31 MED ORDER — AMISULPRIDE (ANTIEMETIC) 5 MG/2ML IV SOLN
INTRAVENOUS | Status: AC
  Filled 2021-07-31: qty 4

## 2021-07-31 MED ORDER — DEXAMETHASONE SODIUM PHOSPHATE 4 MG/ML IJ SOLN
INTRAMUSCULAR | Status: DC | PRN
  Administered 2021-07-31: 10 mg via INTRAVENOUS

## 2021-07-31 MED ORDER — SODIUM CHLORIDE 0.9 % IV SOLN
INTRAVENOUS | Status: DC
Start: 2021-07-31 — End: 2021-08-01

## 2021-07-31 MED ORDER — MIDAZOLAM HCL 2 MG/2ML IJ SOLN
2.0000 mg | Freq: Once | INTRAMUSCULAR | Status: AC
  Administered 2021-07-31: 2 mg via INTRAVENOUS

## 2021-07-31 MED ORDER — TRAMADOL HCL 50 MG PO TABS: 100.0000 mg | ORAL_TABLET | Freq: Four times a day (QID) | ORAL | Status: DC | PRN

## 2021-07-31 MED ORDER — ACETAMINOPHEN 500 MG PO TABS
1000.0000 mg | ORAL_TABLET | Freq: Four times a day (QID) | ORAL | Status: DC
Start: 2021-07-31 — End: 2021-08-01
  Administered 2021-07-31 (×2): 1000 mg via ORAL
  Filled 2021-07-31 (×2): qty 2

## 2021-07-31 MED ORDER — ONDANSETRON HCL 4 MG/2ML IJ SOLN
INTRAMUSCULAR | Status: DC | PRN
  Administered 2021-07-31: 4 mg via INTRAVENOUS

## 2021-07-31 MED ORDER — LISINOPRIL-HYDROCHLOROTHIAZIDE 20-12.5 MG PO TABS
1.0000 | ORAL_TABLET | Freq: Every day | ORAL | Status: DC
Start: 2021-08-01 — End: 2021-07-31

## 2021-07-31 MED ORDER — LACTATED RINGERS IV SOLN: INTRAVENOUS | Status: DC

## 2021-07-31 MED ORDER — METHOCARBAMOL 500 MG PO TABS: 500.0000 mg | ORAL_TABLET | Freq: Three times a day (TID) | ORAL | Status: DC | PRN

## 2021-07-31 SURGICAL SUPPLY — 81 items
ADH SKN CLS APL DERMABOND .7 (GAUZE/BANDAGES/DRESSINGS) ×4
APL PRP STRL LF DISP 70% ISPRP (MISCELLANEOUS) ×2
APL SKNCLS STERI-STRIP NONHPOA (GAUZE/BANDAGES/DRESSINGS)
APPLIER CLIP 11 MED OPEN (CLIP)
APPLIER CLIP 9.375 MED OPEN (MISCELLANEOUS)
APR CLP MED 11 20 MLT OPN (CLIP)
APR CLP MED 9.3 20 MLT OPN (MISCELLANEOUS)
BENZOIN TINCTURE PRP APPL 2/3 (GAUZE/BANDAGES/DRESSINGS) IMPLANT
BINDER BREAST LRG (GAUZE/BANDAGES/DRESSINGS) IMPLANT
BINDER BREAST MEDIUM (GAUZE/BANDAGES/DRESSINGS) IMPLANT
BINDER BREAST XLRG (GAUZE/BANDAGES/DRESSINGS) IMPLANT
BINDER BREAST XXLRG (GAUZE/BANDAGES/DRESSINGS) IMPLANT
BIOPATCH RED 1 DISK 7.0 (GAUZE/BANDAGES/DRESSINGS) IMPLANT
BLADE CLIPPER SURG (BLADE) IMPLANT
BLADE HEX COATED 2.75 (ELECTRODE) IMPLANT
BLADE SURG 10 STRL SS (BLADE) ×3 IMPLANT
BLADE SURG 15 STRL LF DISP TIS (BLADE) ×2 IMPLANT
BLADE SURG 15 STRL SS (BLADE) ×3
CANISTER SUCT 1200ML W/VALVE (MISCELLANEOUS) ×3 IMPLANT
CHLORAPREP W/TINT 26 (MISCELLANEOUS) ×3 IMPLANT
CLIP APPLIE 11 MED OPEN (CLIP) IMPLANT
CLIP APPLIE 9.375 MED OPEN (MISCELLANEOUS) IMPLANT
CLIP TI WIDE RED SMALL 6 (CLIP) IMPLANT
COVER BACK TABLE 60X90IN (DRAPES) ×3 IMPLANT
COVER MAYO STAND STRL (DRAPES) ×3 IMPLANT
COVER PROBE W GEL 5X96 (DRAPES) ×3 IMPLANT
DERMABOND ADVANCED (GAUZE/BANDAGES/DRESSINGS) ×2
DERMABOND ADVANCED .7 DNX12 (GAUZE/BANDAGES/DRESSINGS) ×2 IMPLANT
DRAIN CHANNEL 19F RND (DRAIN) ×3 IMPLANT
DRAPE LAPAROTOMY 100X72 PEDS (DRAPES) ×3 IMPLANT
DRAPE TOP ARMCOVERS (MISCELLANEOUS) ×3 IMPLANT
DRAPE U-SHAPE 76X120 STRL (DRAPES) ×3 IMPLANT
DRAPE UTILITY XL STRL (DRAPES) ×3 IMPLANT
DRSG PAD ABDOMINAL 8X10 ST (GAUZE/BANDAGES/DRESSINGS) ×4 IMPLANT
DRSG TEGADERM 4X4.75 (GAUZE/BANDAGES/DRESSINGS) IMPLANT
ELECT BLADE 4.0 EZ CLEAN MEGAD (MISCELLANEOUS)
ELECT COATED BLADE 2.86 ST (ELECTRODE) ×3 IMPLANT
ELECT REM PT RETURN 9FT ADLT (ELECTROSURGICAL) ×3
ELECTRODE BLDE 4.0 EZ CLN MEGD (MISCELLANEOUS) IMPLANT
ELECTRODE REM PT RTRN 9FT ADLT (ELECTROSURGICAL) ×2 IMPLANT
EVACUATOR SILICONE 100CC (DRAIN) ×3 IMPLANT
GAUZE SPONGE 4X4 12PLY STRL (GAUZE/BANDAGES/DRESSINGS) ×1 IMPLANT
GAUZE SPONGE 4X4 12PLY STRL LF (GAUZE/BANDAGES/DRESSINGS) IMPLANT
GLOVE BIO SURGEON STRL SZ7 (GLOVE) ×3 IMPLANT
GLOVE BIOGEL PI IND STRL 7.5 (GLOVE) ×2 IMPLANT
GLOVE BIOGEL PI INDICATOR 7.5 (GLOVE) ×1
GOWN STRL REUS W/ TWL LRG LVL3 (GOWN DISPOSABLE) ×6 IMPLANT
GOWN STRL REUS W/TWL LRG LVL3 (GOWN DISPOSABLE) ×9
HEMOSTAT ARISTA ABSORB 3G PWDR (HEMOSTASIS) ×1 IMPLANT
LIGHT WAVEGUIDE WIDE FLAT (MISCELLANEOUS) ×3 IMPLANT
NDL HYPO 25X1 1.5 SAFETY (NEEDLE) ×2 IMPLANT
NDL SAFETY ECLIPSE 18X1.5 (NEEDLE) IMPLANT
NEEDLE HYPO 18GX1.5 SHARP (NEEDLE)
NEEDLE HYPO 25X1 1.5 SAFETY (NEEDLE) ×3 IMPLANT
NS IRRIG 1000ML POUR BTL (IV SOLUTION) ×3 IMPLANT
PACK BASIN DAY SURGERY FS (CUSTOM PROCEDURE TRAY) ×3 IMPLANT
PENCIL SMOKE EVACUATOR (MISCELLANEOUS) ×3 IMPLANT
PIN SAFETY STERILE (MISCELLANEOUS) ×3 IMPLANT
RETRACTOR ONETRAX LX 90X20 (MISCELLANEOUS) IMPLANT
SHEET MEDIUM DRAPE 40X70 STRL (DRAPES) IMPLANT
SLEEVE SCD COMPRESS KNEE MED (STOCKING) ×3 IMPLANT
SPIKE FLUID TRANSFER (MISCELLANEOUS) ×3 IMPLANT
SPONGE T-LAP 18X18 ~~LOC~~+RFID (SPONGE) ×3 IMPLANT
SPONGE T-LAP 4X18 ~~LOC~~+RFID (SPONGE) ×3 IMPLANT
STAPLER VISISTAT 35W (STAPLE) ×1 IMPLANT
STRIP CLOSURE SKIN 1/2X4 (GAUZE/BANDAGES/DRESSINGS) ×3 IMPLANT
SUT ETHILON 2 0 FS 18 (SUTURE) ×3 IMPLANT
SUT MNCRL AB 4-0 PS2 18 (SUTURE) ×3 IMPLANT
SUT SILK 2 0 SH (SUTURE) IMPLANT
SUT VIC AB 2-0 SH 27 (SUTURE) ×6
SUT VIC AB 2-0 SH 27XBRD (SUTURE) ×4 IMPLANT
SUT VIC AB 3-0 54X BRD REEL (SUTURE) IMPLANT
SUT VIC AB 3-0 BRD 54 (SUTURE)
SUT VIC AB 3-0 SH 27 (SUTURE) ×3
SUT VIC AB 3-0 SH 27X BRD (SUTURE) ×2 IMPLANT
SUT VICRYL 3-0 CR8 SH (SUTURE) ×4 IMPLANT
SYR CONTROL 10ML LL (SYRINGE) ×3 IMPLANT
TOWEL GREEN STERILE FF (TOWEL DISPOSABLE) ×6 IMPLANT
TRACER MAGTRACE VIAL (MISCELLANEOUS) IMPLANT
TUBE CONNECTING 20X1/4 (TUBING) ×3 IMPLANT
YANKAUER SUCT BULB TIP NO VENT (SUCTIONS) ×3 IMPLANT

## 2021-07-31 NOTE — Anesthesia Preprocedure Evaluation (Addendum)
Anesthesia Evaluation  Patient identified by MRN, date of birth, ID band Patient awake    Reviewed: Allergy & Precautions, NPO status , Patient's Chart, lab work & pertinent test results, reviewed documented beta blocker date and time   History of Anesthesia Complications (+) PONV and history of anesthetic complications  Airway Mallampati: II  TM Distance: >3 FB Neck ROM: Full    Dental no notable dental hx. (+) Teeth Intact, Dental Advisory Given   Pulmonary neg pulmonary ROS,    Pulmonary exam normal breath sounds clear to auscultation       Cardiovascular hypertension, Pt. on medications Normal cardiovascular exam Rhythm:Regular Rate:Normal     Neuro/Psych negative neurological ROS  negative psych ROS   GI/Hepatic negative GI ROS, Neg liver ROS,   Endo/Other  Hyperlipidemia Left Breast Ca  Renal/GU negative Renal ROS  negative genitourinary   Musculoskeletal negative musculoskeletal ROS (+)   Abdominal   Peds  Hematology  (+) Blood dyscrasia, anemia ,   Anesthesia Other Findings   Reproductive/Obstetrics Hx/o cervical Ca                            Anesthesia Physical Anesthesia Plan  ASA: 2  Anesthesia Plan: General   Post-op Pain Management: Minimal or no pain anticipated, Regional block*, Dilaudid IV, Precedex and Tylenol PO (pre-op)*   Induction: Intravenous  PONV Risk Score and Plan: 4 or greater and Scopolamine patch - Pre-op, Treatment may vary due to age or medical condition, Midazolam, Ondansetron and Dexamethasone  Airway Management Planned: LMA  Additional Equipment: None  Intra-op Plan:   Post-operative Plan: Extubation in OR  Informed Consent: I have reviewed the patients History and Physical, chart, labs and discussed the procedure including the risks, benefits and alternatives for the proposed anesthesia with the patient or authorized representative who has  indicated his/her understanding and acceptance.     Dental advisory given  Plan Discussed with: CRNA and Anesthesiologist  Anesthesia Plan Comments:        Anesthesia Quick Evaluation

## 2021-07-31 NOTE — Progress Notes (Signed)
Assisted Dr. Foster with left, pectoralis, ultrasound guided block. Side rails up, monitors on throughout procedure. See vital signs in flow sheet. Tolerated Procedure well. 

## 2021-07-31 NOTE — Anesthesia Postprocedure Evaluation (Signed)
Anesthesia Post Note  Patient: Jasmine Maddox  Procedure(s) Performed: LEFT MASTECTOMY WITH LEFT AXILLARY SENTINEL NODE BIOPSY (Left: Breast) PORT REMOVAL (Right: Chest)     Patient location during evaluation: PACU Anesthesia Type: General Level of consciousness: awake and alert and oriented Pain management: pain level controlled Vital Signs Assessment: post-procedure vital signs reviewed and stable Respiratory status: spontaneous breathing, nonlabored ventilation and respiratory function stable Cardiovascular status: blood pressure returned to baseline and stable Postop Assessment: no apparent nausea or vomiting Anesthetic complications: no   No notable events documented.  Last Vitals:  Vitals:   07/31/21 1145 07/31/21 1200  BP: 111/76 (!) 159/94  Pulse: (!) 49 (!) 54  Resp: 17 15  Temp:  (!) 36.4 C  SpO2: 98% 97%    Last Pain:  Vitals:   07/31/21 1145  TempSrc:   PainSc: 0-No pain                 Lynnwood Beckford A.

## 2021-07-31 NOTE — Op Note (Signed)
Preoperative diagnosis: Left breast cancer status post primary chemotherapy Postoperative diagnosis: Same as above Procedure: 1.  Left total mastectomy 2.  Left deep axillary sentinel lymph node biopsy 3.  Injection of mag trace for sentinel lymph node identification 4.  Removal of port Surgeon: Dr. Serita Grammes Assistant: Gillermo Murdoch, RNFA Anesthesia: General with a pectoral block Estimated blood loss: Minimal Drains: 36 French Blake drain Specimens: 1.  Left mastectomy marked short superior, long lateral 2.  Left deep axillary sentinel lymph nodes with highest count of 8338 Complications: None Sponge needle count was correct completion Decision to recovery stable addition  Indications:61 y.o. 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 She had genetic testing in 2020 that was negative. She underwent screening mammogram that showed multiple upper outer quadrant masses measuring 5 to 20 mm in size. The dominant mass is a 1.2 cm mass with calcs that measures 2.3 cm total with the calcifications. There are numerous other masses that are present by ultrasound. There are a total of 4 of these 2 of which have been biopsied she had an enlarged axillary node that was biopsied and is benign and concordant. Biopsy of 2 of the breast lesions at either end or grade 3 invasive ductal carcinoma that is ER positive at 40%, PR negative, HER2 negative, and Ki-67 is 40%. she had an MRI that showed four suspicious masses (two biopsied) in left breast (two uoq, one uiq, one loq) with four mildly prominent lymph nodes.she has now undergone primary chemotherapy She had some itching/neuropathy and chemo was stopped. She otherwise did well. Repeat mri showed near complete resolution with no abnl nodes present.  We discussed proceeding with a mastectomy as well as a sentinel node biopsy and unremitting  reconstruction.  Procedure: After informed consent was obtained she presented to preop.  We then did a timeout.  I prepped the area lateral to the areola injected 2 cc of mag trace in the subareolar position prior to surgery.  She then underwent a pectoral block.  Antibiotics were given.  SCDs were in place.  She was taken to the operating room placed under general anesthesia without complication.  She was prepped and draped in the standard sterile surgical fashion.  I first remove the port.  I reentered the old incision.  The port and the line as well as the suture material were all removed in their entirety.  I then obtained hemostasis.  This was closed with 3-0 Vicryl and 4 Monocryl.  Steri-Strips and glue were applied.  I made an incision fashion like an S that included the nipple and the areola.  I carried this up into the axilla.  I then created flaps to the parasternal area, clavicle, latissimus laterally.  I took this down below and obliterated the inframammary fold.  I then remove the breast and the fascia from the muscle.  This was passed off the table.  I then entered into the axilla.  I was able to identify 2 small areas of sentinel lymph nodes.  The counts at the highest were 2397.  There was one brown lymph node that was noted.  These were normal-appearing lymph nodes.  There was no background activity at completion.  There were no other palpable nodes present.  I then obtained hemostasis.  I placed a 59 [place] Blake drain and secured this with a 2-0 nylon suture.  I then started  laterally and closed the dermis with 3-0 Vicryl taking care to avoid any excess tissue laterally.  Once I had completed this the skin was closed with 4-0 Monocryl.  Glue and Steri-Strips were applied.  She tolerated this well was extubated and transferred to recovery stable.

## 2021-07-31 NOTE — Anesthesia Procedure Notes (Signed)
Anesthesia Regional Block: Pectoralis block   Pre-Anesthetic Checklist: , timeout performed,  Correct Patient, Correct Site, Correct Laterality,  Correct Procedure, Correct Position, site marked,  Risks and benefits discussed,  Surgical consent,  Pre-op evaluation,  At surgeon's request and post-op pain management  Laterality: Left  Prep: chloraprep       Needles:  Injection technique: Single-shot  Needle Type: Echogenic Stimulator Needle     Needle Length: 10cm  Needle Gauge: 21   Needle insertion depth: 7 cm   Additional Needles:   Procedures:,,,, ultrasound used (permanent image in chart),,    Narrative:  Start time: 07/31/2021 8:29 AM End time: 07/31/2021 8:34 AM Injection made incrementally with aspirations every 5 mL.  Performed by: Personally  Anesthesiologist: Josephine Igo, MD  Additional Notes: Timeout performed. Patient sedated. Relevant anatomy ID'd using [place]. Incremental 2-30m injection of LA with frequent aspiration. Patient tolerated procedure well.     Left Pectoralis Block

## 2021-07-31 NOTE — Discharge Instructions (Signed)
[facility], [place] [phone]  MASTECTOMY: POST OP INSTRUCTIONS Take 400 mg of ibuprofen every 8 hours or 650 mg tylenol every 6 hours for next 72 hours then as needed. Use ice several times daily also. Always review your discharge instruction sheet given to you by the facility where your surgery was performed.  A prescription for pain medication may be given to you upon discharge.  Take your pain medication as prescribed, if needed.  If narcotic pain medicine is not needed, then you may take acetaminophen (Tylenol), naprosyn (Alleve) or ibuprofen (Advil) as needed. Take your usually prescribed medications unless otherwise directed. If you need a refill on your pain medication, please contact your pharmacy.  They will contact our office to request authorization.  Prescriptions will not be filled after 5pm or on week-ends. You should follow a light diet the first 24 hours after surgery.  Resume your normal diet the day after surgery. Most patients will experience some swelling and bruising on the chest and underarm.  Ice packs will help.  Swelling and bruising can take several days to resolve. Wear the binder or [place] bra for 72 hours day and night. After night please wear during the day.  It is common to experience some constipation if taking pain medication after surgery.  Increasing fluid intake and taking a stool softener (such as Colace) will usually help or prevent this problem from occurring.  A mild laxative (Milk of Magnesia or Miralax) should be taken according to package instructions if there are no bowel movements after 48 hours. There is glue and steristrips on your incision. They will come off in the next few weeks.  You may take a shower 48 hours after surgery.  Any sutures will be removed at an office visit DRAINS:  If you have drains in place, it is important to keep a list of the amount of drainage produced each day in your drains.  Before leaving the hospital, you  should be instructed on drain care.  Call our office if you have any questions about your drains. I will remove your drains when they put out less than 30 cc or ml for 2 consecutive days. ACTIVITIES:  You may resume regular (light) daily activities beginning the next day--such as daily self-care, walking, climbing stairs--gradually increasing activities as tolerated.  You may have sexual intercourse when it is comfortable.  Refrain from any heavy lifting or straining until approved by your doctor. You may drive when you are no longer taking prescription pain medication, you can comfortably wear a seatbelt, and you can safely maneuver your car and apply brakes. RETURN TO WORK:  __________________________________________________________ Jasmine Maddox should see your doctor in the office for a follow-up appointment approximately 3-5 days after your surgery.  Your doctor's nurse will typically make your follow-up appointment when she calls you with your pathology report.  Expect your pathology report 3-4business days after surgery. OTHER INSTRUCTIONS: ______________________________________________________________________________________________ ____________________________________________________________________________________________ WHEN TO CALL YOUR DR Jahi Roza: Fever over 101.0 Nausea and/or vomiting Extreme swelling or bruising Continued bleeding from incision. Increased pain, redness, or drainage from the incision. The clinic staff is available to answer your questions during regular business hours.  Please don't hesitate to call and ask to speak to one of the nurses for clinical concerns.  If you have a medical emergency, go to the nearest emergency room or call 911.  A surgeon from [facility] is always on call at the hospital. [address], [address], [place], [place]  [address] ? [address] [address], [place], [place]   [address] [phone] ? [phone] ? FAX [phone] Web site:  [email or web address]  Information for Discharge Teaching: EXPAREL (bupivacaine liposome injectable suspension)   Your surgeon or anesthesiologist gave you EXPAREL(bupivacaine) to help control your pain after surgery.  EXPAREL is a local anesthetic that provides pain relief by numbing the tissue around the surgical site. EXPAREL is designed to release pain medication over time and can control pain for up to 72 hours. Depending on how you respond to EXPAREL, you may require less pain medication during your recovery.  Possible side effects: Temporary loss of sensation or ability to move in the area where bupivacaine was injected. Nausea, vomiting, constipation Rarely, numbness and tingling in your mouth or lips, lightheadedness, or anxiety may occur. Call your doctor right away if you think you may be experiencing any of these sensations, or if you have other questions regarding possible side effects.  Follow all other discharge instructions given to you by your surgeon or nurse. Eat a healthy diet and drink plenty of water or other fluids.  If you return to the hospital for any reason within 96 hours following the administration of EXPAREL, it is important for health care providers to know that you have received this anesthetic. A teal colored band has been placed on your arm with the date, time and amount of EXPAREL you have received in order to alert and inform your health care providers. Please leave this armband in place for the full 96 hours following administration, and then you may remove the band. Information for Discharge Teaching: EXPAREL (bupivacaine liposome injectable suspension)   Your surgeon or anesthesiologist gave you EXPAREL(bupivacaine) to help control your pain after surgery.  EXPAREL is a local anesthetic that provides pain relief by numbing the tissue around the surgical site. EXPAREL is designed to release pain medication over time and can control pain for up to  72 hours. Depending on how you respond to EXPAREL, you may require less pain medication during your recovery.  Possible side effects: Temporary loss of sensation or ability to move in the area where bupivacaine was injected. Nausea, vomiting, constipation Rarely, numbness and tingling in your mouth or lips, lightheadedness, or anxiety may occur. Call your doctor right away if you think you may be experiencing any of these sensations, or if you have other questions regarding possible side effects.  Follow all other discharge instructions given to you by your surgeon or nurse. Eat a healthy diet and drink plenty of water or other fluids.  If you return to the hospital for any reason within 96 hours following the administration of EXPAREL, it is important for health care providers to know that you have received this anesthetic. A teal colored band has been placed on your arm with the date, time and amount of EXPAREL you have received in order to alert and inform your health care providers. Please leave this armband in place for the full 96 hours following administration, and then you may remove the band.Information for Discharge Teaching: EXPAREL (bupivacaine liposome injectable suspension)   Your surgeon or anesthesiologist gave you EXPAREL(bupivacaine) to help control your pain after surgery.  EXPAREL is a local anesthetic that provides pain relief by numbing the tissue around the surgical site. EXPAREL is designed to release pain medication over time and can control pain for up to 72 hours. Depending on how you respond to EXPAREL, you may require less pain medication during your recovery.  Possible  side effects: Temporary loss of sensation or ability to move in the area where bupivacaine was injected. Nausea, vomiting, constipation Rarely, numbness and tingling in your mouth or lips, lightheadedness, or anxiety may occur. Call your doctor right away if you think you may be experiencing any of  these sensations, or if you have other questions regarding possible side effects.  Follow all other discharge instructions given to you by your surgeon or nurse. Eat a healthy diet and drink plenty of water or other fluids.  If you return to the hospital for any reason within 96 hours following the administration of EXPAREL, it is important for health care providers to know that you have received this anesthetic. A teal colored band has been placed on your arm with the date, time and amount of EXPAREL you have received in order to alert and inform your health care providers. Please leave this armband in place for the full 96 hours following administration, and then you may remove the band.About my Jackson-Pratt Bulb Drain  What is a Jackson-Pratt bulb? A Jackson-Pratt is a soft, round device used to collect drainage. It is connected to a long, thin drainage catheter, which is held in place by one or two small stiches near your surgical incision site. When the bulb is squeezed, it forms a vacuum, forcing the drainage to empty into the bulb.  Emptying the Jackson-Pratt bulb- To empty the bulb: 1. Release the plug on the top of the bulb. 2. Pour the bulb's contents into a measuring container which your nurse will provide. 3. Record the time emptied and amount of drainage. Empty the drain(s) as often as your     doctor or nurse recommends.  Date                  Time                    Amount (Drain 1)                 Amount (Drain  2)  _____________________________________________________________________  _____________________________________________________________________  _____________________________________________________________________  _____________________________________________________________________  _____________________________________________________________________  _____________________________________________________________________  _____________________________________________________________________  _____________________________________________________________________  Squeezing the Jackson-Pratt Bulb- To squeeze the bulb: 1. Make sure the plug at the top of the bulb is open. 2. Squeeze the bulb tightly in your fist. You will hear air squeezing from the bulb. 3. Replace the plug while the bulb is squeezed. 4. Use a safety pin to attach the bulb to your clothing. This will keep the catheter from     pulling at the bulb insertion site.  When to call your doctor- Call your doctor if: Drain site becomes red, swollen or hot. You have a fever greater than 101 degrees F. There is oozing at the drain site. Drain falls out (apply a guaze bandage over the drain hole and secure it with tape). Drainage increases daily not related to activity patterns. (You will usually have more drainage when you are active than when you are resting.) Drainage has a bad odor.

## 2021-07-31 NOTE — Interval H&P Note (Signed)
History and Physical Interval Note:  07/31/2021 9:37 AM  Jasmine Maddox  has presented today for surgery, with the diagnosis of left breast cancer.  The various methods of treatment have been discussed with the patient and family. After consideration of risks, benefits and other options for treatment, the patient has consented to  Procedure(s) with comments: LEFT MASTECTOMY WITH LEFT AXILLARY SENTINEL NODE BIOPSY (Left) - GEN & PEC BLOCK PORT REMOVAL (N/A) as a surgical intervention.  The patient's history has been reviewed, patient examined, no change in status, stable for surgery.  I have reviewed the patient's chart and labs.  Questions were answered to the patient's satisfaction.     Rolm Bookbinder

## 2021-07-31 NOTE — Interval H&P Note (Signed)
History and Physical Interval Note:  07/31/2021 9:39 AM  Jasmine Maddox  has presented today for surgery, with the diagnosis of left breast cancer.  The various methods of treatment have been discussed with the patient and family. After consideration of risks, benefits and other options for treatment, the patient has consented to  Procedure(s) with comments: LEFT MASTECTOMY WITH LEFT AXILLARY SENTINEL NODE BIOPSY (Left) - GEN & PEC BLOCK PORT REMOVAL (N/A) as a surgical intervention.  The patient's history has been reviewed, patient examined, no change in status, stable for surgery.  I have reviewed the patient's chart and labs.  Questions were answered to the patient's satisfaction.     Rolm Bookbinder

## 2021-07-31 NOTE — Anesthesia Procedure Notes (Signed)
Procedure Name: LMA Insertion Date/Time: 07/31/2021 10:13 AM  Performed by: Verita Lamb, CRNAPre-anesthesia Checklist: Patient identified, Emergency Drugs available, Suction available and Patient being monitored Patient Re-evaluated:Patient Re-evaluated prior to induction Oxygen Delivery Method: Circle system utilized Preoxygenation: Pre-oxygenation with 100% oxygen Induction Type: IV induction Ventilation: Mask ventilation without difficulty LMA: LMA inserted LMA Size: 4.0 Number of attempts: 1 Airway Equipment and Method: Bite block Placement Confirmation: positive ETCO2, CO2 detector and breath sounds checked- equal and bilateral Tube secured with: Tape Dental Injury: Teeth and Oropharynx as per pre-operative assessment

## 2021-07-31 NOTE — Transfer of Care (Signed)
Immediate Anesthesia Transfer of Care Note  Patient: Jasmine Maddox  Procedure(s) Performed: LEFT MASTECTOMY WITH LEFT AXILLARY SENTINEL NODE BIOPSY (Left: Breast) PORT REMOVAL (Right: Chest)  Patient Location: PACU  Anesthesia Type:General and Regional  Level of Consciousness: drowsy  Airway & Oxygen Therapy: Patient Spontanous Breathing and Patient connected to face mask oxygen  Post-op Assessment: Report given to RN and Post -op Vital signs reviewed and stable  Post vital signs: Reviewed and stable  Last Vitals:  Vitals Value Taken Time  BP 115/81 07/31/21 1125  Temp    Pulse 62 07/31/21 1128  Resp 12 07/31/21 1128  SpO2 99 % 07/31/21 1128  Vitals shown include unvalidated device data.  Last Pain:  Vitals:   07/31/21 0733  TempSrc: Oral  PainSc: 0-No pain      Patients Stated Pain Goal: 4 (40/98/11 9147)  Complications: No notable events documented.

## 2021-07-31 NOTE — H&P (Signed)
61 y.o. 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 She does have a significant family history. She had genetic testing in 2020 that was negative. She had no mass or discharge. She underwent screening mammogram that showed multiple upper outer quadrant masses measuring 5 to 20 mm in size. The dominant mass is a 1.2 cm mass with calcs that measures 2.3 cm total with the calcifications. There are numerous other masses that are present by ultrasound. There are a total of 4 of these 2 of which have been biopsied she had an enlarged axillary node that was biopsied and is benign and concordant. Biopsy of 2 of the breast lesions at either end or grade 3 invasive ductal carcinoma that is ER positive at 40%, PR negative, HER2 negative, and Ki-67 is 40%. she had an MRI that showed four suspicious masses (two biopsied) in left breast (two uoq, one uiq, one loq) with four mildly prominent lymph nodes.she has now undergone primary chemotherapy She had some itching/neuropathy and chemo was stopped. She otherwise did well. Repeat mri showed near complete resolution with no abnl nodes present   Review of Systems: A complete review of systems was obtained from the patient. I have reviewed this information and discussed as appropriate with the patient. See HPI as well for other ROS.  Review of Systems  All other systems reviewed and are negative.   Medical History: Past Medical History:  Diagnosis Date  History of cancer  Hypertension   Patient Active Problem List  Diagnosis  Essential hypertension, benign  Ductal carcinoma in situ (DCIS) of right breast  Malignant neoplasm of upper-outer quadrant of left breast in female, estrogen receptor positive (CMS-HCC)   Past Surgical History:  Procedure Laterality Date  MASTECTOMY PARTIAL / LUMPECTOMY  dcis    No Known Allergies  Current Outpatient  Medications on File Prior to Visit  Medication Sig Dispense Refill  lisinopriL (ZESTRIL) 20 MG tablet Take 20 mg by mouth once daily  tamoxifen (NOLVADEX) 20 MG tablet 1 tablet   No current facility-administered medications on file prior to visit.   Family History  Problem Relation Age of Onset  Breast cancer Mother  Obesity Sister  Diabetes Sister  Breast cancer Sister    Social History   Tobacco Use  Smoking Status Never  Smokeless Tobacco Never    Social History   Socioeconomic History  Marital status: Married  Tobacco Use  Smoking status: Never  Smokeless tobacco: Never  Substance and Sexual Activity  Alcohol use: Yes  Comment: occassionally  Drug use: Never   Objective:   There were no vitals filed for this visit.  There is no height or weight on file to calculate BMI.  Physical Exam Constitutional:  Appearance: Normal appearance.  Chest:  Breasts: Right: No inverted nipple, mass or nipple discharge.  Left: No inverted nipple, mass or nipple discharge.   Lymphadenopathy:  Upper Body:  Right upper body: No supraclavicular or axillary adenopathy.  Left upper body: No supraclavicular or axillary adenopathy.  Neurological:  Mental Status: She is alert.    Assessment and Plan:  Left mastectomy, left ax sn biopsy, port removal  I think that with volume of disease mastectomy is best option and she agrees. Does not desire reconstruction at all. Will plan to schedule 4-6 weeks after completing chemotherapy. We discussed bilateral and does not need that medically. Will remove port per oncology  We discussed a sentinel lymph node biopsy as she does not appear to having lymph node involvement. We discussed the performance of that with injection of tracer. We discussed that there is a chance of having a positive node with a sentinel lymph node biopsy and we will await the permanent pathology to make any other first further decisions in terms of her treatment. We  discussed up to a 5% risk lifetime of chronic shoulder pain as well as lymphedema associated with a sentinel lymph node biopsy. Her previous biopsy of the node was negative so don't think she needs that node targeted.  We discussed the options for treatment of the breast cancer which included lumpectomy versus a mastectomy. I do not think lumpectomy is best option. We discussed mastectomy and the postoperative care for that as well. Mastectomy can be followed by reconstruction. The decision for lumpectomy vs mastectomy has no impact on decision for chemotherapy. Most mastectomy patients will not need radiation therapy. We discussed that there is no difference in her survival whether she undergoes lumpectomy with radiation therapy or antiestrogen therapy versus a mastectomy. There is also no real difference between her recurrence in the breast. We discussed the risks of operation including bleeding, infection, possible reoperation. She understands her further therapy will be based on what her stages at the time of her operation.

## 2021-08-01 ENCOUNTER — Encounter (HOSPITAL_BASED_OUTPATIENT_CLINIC_OR_DEPARTMENT_OTHER): Payer: Self-pay | Admitting: General Surgery

## 2021-08-01 MED ORDER — METHOCARBAMOL 750 MG PO TABS: 750.0000 mg | ORAL_TABLET | Freq: Three times a day (TID) | ORAL | 0 refills | Status: DC | PRN

## 2021-08-01 NOTE — Discharge Summary (Signed)
Physician Discharge Summary  Patient ID: Jasmine Maddox MRN: [id number] DOB/AGE: [birth year] 61 y.o.  Admit date: 07/31/2021 Discharge date: 08/01/2021  Admission Diagnoses: Left breast cancer s/p primary chemotherapy HTN  Discharge Diagnoses:  Principal Problem:   S/P mastectomy, left   Discharged Condition: good  Hospital Course: 61 yof s/p left mastectomy, sn biopsy , port removal after primary chemotherapy. Doing well following day. Drain output as expected  Consults: None  Significant Diagnostic Studies: none  Treatments: surgery: left mastectomy, left ax sn biopsy, port removal  Discharge Exam: Blood pressure 127/76, pulse (!) 58, temperature 98.2 F (36.8 C), resp. rate 16, height '5\' 3"'$  (1.6 m), weight 61.5 kg, SpO2 98 %. Flaps viable, no hematoma, drain serosang  Disposition: Discharge disposition: 01-Home or Self Care        Allergies as of 08/01/2021   No Known Allergies      Medication List     TAKE these medications    ALPRAZolam 0.25 MG tablet Commonly known as: XANAX Take 1 tablet (0.25 mg total) by mouth 2 (two) times daily as needed for anxiety.   chlorhexidine 0.12 % solution Commonly known as: PERIDEX 15 mLs 2 (two) times daily.   lidocaine-prilocaine cream Commonly known as: EMLA Apply to affected area once   lisinopril-hydrochlorothiazide 20-12.5 MG tablet Commonly known as: ZESTORETIC Take 1 tablet by mouth daily.   methocarbamol 750 MG tablet Commonly known as: ROBAXIN Take 1 tablet (750 mg total) by mouth every 8 (eight) hours as needed (use for muscle cramps/pain).   mupirocin ointment 2 % Commonly known as: BACTROBAN Apply 1 application. topically daily.   ondansetron 8 MG tablet Commonly known as: Zofran Take 1 tablet (8 mg total) by mouth 2 (two) times daily as needed. Start on the third day after chemotherapy.   prochlorperazine 10 MG tablet Commonly known as: COMPAZINE Take 1 tablet (10 mg total) by mouth every 6  (six) hours as needed (Nausea or vomiting).   tamoxifen 20 MG tablet Commonly known as: NOLVADEX Take 20 mg by mouth daily.   traMADol 50 MG tablet Commonly known as: ULTRAM Take 1 tablet (50 mg total) by mouth every 6 (six) hours as needed.        Follow-up Information     Rolm Bookbinder, MD Follow up in 2 week(s).   Specialty: General Surgery Contact information: [address] [place] [address] [phone]                 Signed: Rolm Bookbinder 08/01/2021, 7:55 AM

## 2021-08-06 LAB — SURGICAL PATHOLOGY

## 2021-08-07 ENCOUNTER — Encounter: Payer: Self-pay | Admitting: *Deleted

## 2021-08-13 ENCOUNTER — Other Ambulatory Visit: Payer: Self-pay

## 2021-08-13 NOTE — Progress Notes (Signed)
Patient Care Team: Jasmine Huxley, PA as PCP - General (Family Medicine) [name], MD as Consulting Physician (General Surgery) [name], MD as Consulting Physician (Hematology and Oncology) [name], MD as Consulting Physician (Radiation Oncology) Rockwell Germany, RN as Oncology Nurse Navigator [name], RN as Oncology Nurse Navigator  DIAGNOSIS: No diagnosis found.  SUMMARY OF ONCOLOGIC HISTORY: Oncology History  Ductal carcinoma in situ (DCIS) of right breast  01/30/2018 Mammogram   Diagnostic Mammogram  0.4cm cluster of calcifications, 12 o'clock position, 4cm fn, right breast   02/12/2018 Initial Diagnosis   Screening detected right breast calcifications 4 mm size UOQ 11:30 position stereotactic biopsy revealed high-grade DCIS ER 90%, PR 10%, Tis NX stage 0   02/18/2018 Genetic Testing   Negative.  Genes tested include: ATM, BRCA1, BRCA2, CDH1, CHEK2, PALB2, PTEN, STK11 and TP53; APC, ATM, AXIN2, BARD1, BMPR1A, BRCA1, BRCA2, BRIP1, CDH1, CDKN2A (p14ARF), CDKN2A (p16INK4a), CKD4, CHEK2, CTNNA1, DICER1, EPCAM (Deletion/duplication testing only), GREM1 (promoter region deletion/duplication testing only), KIT, MEN1, MLH1, MSH2, MSH3, MSH6, MUTYH, NBN, NF1, NHTL1, PALB2, PDGFRA, PMS2, POLD1, POLE, PTEN, RAD50, RAD51C, RAD51D, SDHB, SDHC, SDHD, SMAD4, SMARCA4. STK11, TP53, TSC1, TSC2, and VHL.  The following genes were evaluated for sequence changes only: SDHA and HOXB13 c.251G>A variant only.   03/20/2018 Surgery   Right lumpectomy: Scattered microscopic foci of DCIS intermediate grade, margins negative, ER 90%, PR 10%, Tis NX stage 0   04/01/2018 Cancer Staging   Staging form: Breast, AJCC 8th Edition - Pathologic: Stage 0 (pTis (DCIS), pN0, cM0, ER+, PR+) - Signed by Jasmine Phlegm, NP on 04/01/2018   04/28/2018 - 05/26/2018 Radiation Therapy   Adjuvant radiation 1. Right breast; 15 fractions of 2.67 Gy for a total of 40.05 Gy 2. Boost; 5 fractions of 2  Gy for a total of 10 Gy     05/2018 -  Anti-estrogen oral therapy   Tamoxifen daily   Malignant neoplasm of upper-outer quadrant of left breast in female, estrogen receptor positive ([place])  01/19/2021 Initial Diagnosis   Left breast biopsy: 2:00 and 4:00: Grade 3 IDC ER 40% weak, PR 0%, Ki-67 40%, HER2 2+ equivocal by IHC, FISH negative Lymph node biopsy: Benign   01/19/2021 Cancer Staging   Staging form: Breast, AJCC 8th Edition - Clinical stage from 01/19/2021: Stage IIIA (cT2, cN1, cM0, G3, ER+, PR-, HER2-) - Signed by Jasmine Phlegm, NP on 05/09/2021 Stage prefix: Initial diagnosis Histologic grading system: 3 grade system   02/12/2021 Breast MRI   Breast MRI 02/12/2021: 4 suspicious masses within the left breast consistent with multifocal, multicentric disease 2 of the 4 masses have been biopsied (2 cm, 1.3 cm, 1.2 cm, 1.7 cm) for mildly prominent level 1 lymph nodes are identified   03/14/2021 -  Chemotherapy   Patient is on Treatment Plan : BREAST ADJUVANT DOSE DENSE AC q14d / PACLitaxel q7d       CHIEF COMPLIANT: Follow-up left breast cancer  INTERVAL HISTORY: Jasmine Maddox is a 61 y.o. with above-mentioned history of left breast cancer. She presents to the clinic today for a follow-up.   ALLERGIES:  has No Known Allergies.  MEDICATIONS:  Current Outpatient Medications  Medication Sig Dispense Refill   ALPRAZolam (XANAX) 0.25 MG tablet Take 1 tablet (0.25 mg total) by mouth 2 (two) times daily as needed for anxiety. 30 tablet 1   chlorhexidine (PERIDEX) 0.12 % solution 15 mLs 2 (two) times daily.     lidocaine-prilocaine (EMLA) cream Apply to  affected area once 30 g 3   lisinopril-hydrochlorothiazide (ZESTORETIC) 20-12.5 MG tablet Take 1 tablet by mouth daily.     methocarbamol (ROBAXIN) 750 MG tablet Take 1 tablet (750 mg total) by mouth every 8 (eight) hours as needed (use for muscle cramps/pain). 12 tablet 0   mupirocin ointment (BACTROBAN) 2 % Apply 1  application. topically daily. 22 g 0   ondansetron (ZOFRAN) 8 MG tablet Take 1 tablet (8 mg total) by mouth 2 (two) times daily as needed. Start on the third day after chemotherapy. 30 tablet 1   prochlorperazine (COMPAZINE) 10 MG tablet Take 1 tablet (10 mg total) by mouth every 6 (six) hours as needed (Nausea or vomiting). 30 tablet 1   tamoxifen (NOLVADEX) 20 MG tablet Take 20 mg by mouth daily.     traMADol (ULTRAM) 50 MG tablet Take 1 tablet (50 mg total) by mouth every 6 (six) hours as needed. 10 tablet 0   No current facility-administered medications for this visit.    PHYSICAL EXAMINATION: ECOG PERFORMANCE STATUS: {CHL ONC ECOG [phone]}  There were no vitals filed for this visit. There were no vitals filed for this visit.  BREAST:*** No palpable masses or nodules in either right or left breasts. No palpable axillary supraclavicular or infraclavicular adenopathy no breast tenderness or nipple discharge. (exam performed in the presence of a chaperone)  LABORATORY DATA:  I have reviewed the data as listed    Latest Ref Rng & Units 07/20/2021    8:18 AM 06/21/2021   11:07 AM 06/14/2021   11:45 AM  CMP  Glucose 70 - 99 mg/dL 98  122  105   BUN 6 - 20 mg/dL '12  9  10   ' Creatinine 0.44 - 1.00 mg/dL 0.75  0.64  0.65   Sodium 135 - 145 mmol/L 140  137  137   Potassium 3.5 - 5.1 mmol/L 3.9  3.5  3.7   Chloride 98 - 111 mmol/L 105  103  103   CO2 22 - 32 mmol/L '27  29  28   ' Calcium 8.9 - 10.3 mg/dL 9.5  9.4  8.8   Total Protein 6.5 - 8.1 g/dL  6.8  7.0   Total Bilirubin 0.3 - 1.2 mg/dL  0.4  0.4   Alkaline Phos 38 - 126 U/L  48  53   AST 15 - 41 U/L  18  29   ALT 0 - 44 U/L  20  25     Lab Results  Component Value Date   WBC 3.5 (L) 06/21/2021   HGB 9.0 (L) 06/21/2021   HCT 27.1 (L) 06/21/2021   MCV 93.8 06/21/2021   PLT 264 06/21/2021   NEUTROABS 2.5 06/21/2021    ASSESSMENT & PLAN:  No problem-specific Assessment & Plan notes found for this encounter.    No  orders of the defined types were placed in this encounter.  The patient has a good understanding of the overall plan. she agrees with it. she will call with any problems that may develop before the next visit here. Total time spent: 30 mins including face to face time and time spent for planning, charting and co-ordination of care   Jasmine Maddox, [place] 08/13/21    I Jasmine Maddox am scribing for Dr. Lindi Maddox  ***

## 2021-08-14 ENCOUNTER — Inpatient Hospital Stay (HOSPITAL_BASED_OUTPATIENT_CLINIC_OR_DEPARTMENT_OTHER): Payer: BC Managed Care – PPO | Admitting: Hematology and Oncology

## 2021-08-14 ENCOUNTER — Other Ambulatory Visit: Payer: Self-pay

## 2021-08-14 DIAGNOSIS — Z79899 Other long term (current) drug therapy: Secondary | ICD-10-CM | POA: Diagnosis not present

## 2021-08-14 DIAGNOSIS — T451X5A Adverse effect of antineoplastic and immunosuppressive drugs, initial encounter: Secondary | ICD-10-CM | POA: Diagnosis not present

## 2021-08-14 DIAGNOSIS — D0511 Intraductal carcinoma in situ of right breast: Secondary | ICD-10-CM | POA: Diagnosis not present

## 2021-08-14 DIAGNOSIS — Z170 Estrogen receptor positive status [ER+]: Secondary | ICD-10-CM

## 2021-08-14 DIAGNOSIS — G620 Drug-induced polyneuropathy: Secondary | ICD-10-CM | POA: Diagnosis not present

## 2021-08-14 DIAGNOSIS — C50412 Malignant neoplasm of upper-outer quadrant of left female breast: Secondary | ICD-10-CM

## 2021-08-14 DIAGNOSIS — N951 Menopausal and female climacteric states: Secondary | ICD-10-CM | POA: Diagnosis not present

## 2021-08-14 MED ORDER — LETROZOLE 2.5 MG PO TABS: 2.5000 mg | ORAL_TABLET | Freq: Every day | ORAL | 3 refills | Status: DC

## 2021-08-14 NOTE — Assessment & Plan Note (Addendum)
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, PR 0%, Ki-67 40%, HER2 2+ equivocal by IHC, FISH negative Lymph node biopsy: Benign  Breast MRI 02/12/2021: 4 suspicious masses within the left breast consistent with multifocal, multicentric disease 2 of the 4 masses have been biopsied (2 cm, 1.3 cm, 1.2 cm, 1.7 cm) for mildly prominent level 1 lymph nodes are identified  Treatment plan: 1.Neoadjuvant chemotherapy with dose dense Adriamycin Cytoxan followed by Taxol weekly x6 discontinued because of neuropathy 2.mastectomywith sentinel lymph node biopsy: 07/31/2021: Residual grade 2 IDC 4.1 mm, margins negative, 0/5 lymph nodes negative, ER 40% weak staining, PR negative, HER2 negative, Ki-67 40% 3.Followed by adjuvant antiestrogen therapy ------------------------------------------------------------------------------------------ Chemo-induced peripheral neuropathy: Marked improvement 90% improvement.  Letrozole counseling: We discussed the risks and benefits of anti-estrogen therapy with aromatase inhibitors. These include but not limited to insomnia, hot flashes, mood changes, vaginal dryness, bone density loss, and weight gain. We strongly believe that the benefits far outweigh the risks. Patient understands these risks and consented to starting treatment. Planned treatment duration is 5-7 years.  Return to clinic in 3 months for survivorship care plan visit

## 2021-08-15 ENCOUNTER — Encounter: Payer: Self-pay | Admitting: *Deleted

## 2021-08-15 ENCOUNTER — Other Ambulatory Visit: Payer: Self-pay

## 2021-08-15 DIAGNOSIS — Z170 Estrogen receptor positive status [ER+]: Secondary | ICD-10-CM

## 2021-08-21 ENCOUNTER — Other Ambulatory Visit: Payer: Self-pay

## 2021-09-04 ENCOUNTER — Ambulatory Visit: Payer: BC Managed Care – PPO | Attending: General Surgery

## 2021-09-04 DIAGNOSIS — R293 Abnormal posture: Secondary | ICD-10-CM | POA: Diagnosis not present

## 2021-09-04 DIAGNOSIS — M25612 Stiffness of left shoulder, not elsewhere classified: Secondary | ICD-10-CM | POA: Diagnosis not present

## 2021-09-04 DIAGNOSIS — C50412 Malignant neoplasm of upper-outer quadrant of left female breast: Secondary | ICD-10-CM | POA: Diagnosis not present

## 2021-09-04 DIAGNOSIS — Z483 Aftercare following surgery for neoplasm: Secondary | ICD-10-CM | POA: Diagnosis not present

## 2021-09-04 DIAGNOSIS — M6281 Muscle weakness (generalized): Secondary | ICD-10-CM | POA: Diagnosis not present

## 2021-09-04 NOTE — Patient Instructions (Signed)
     [facility]  [address], [address]  [place] [address]  [phone]  After Breast Cancer Class It is recommended you attend the ABC class to be educated on lymphedema risk reduction. This class is free of charge and lasts for 1 hour. It is a 1-time class. You will need to download the Webex app either on your phone or computer. We will send you a link the night before or the morning of the class. You should be able to click on that link to join the class. This is not a confidential class. You don't have to turn your camera on, but other participants may be able to see your email address.  Scar massage You can begin gentle scar massage to you incision sites. Gently place one hand on the incision and move the skin (without sliding on the skin) in various directions. Do this for a few minutes and then you can gently massage either coconut oil or vitamin E cream into the scars.  Compression garment You should continue wearing your compression bra until you feel like you no longer have swelling.  Home exercise Program Continue doing the exercises you were given until you feel like you can do them without feeling any tightness at the end.   Walking Program Studies show that 30 minutes of walking per day (fast enough to elevate your heart rate) can significantly reduce the risk of a cancer recurrence. If you can't walk due to other medical reasons, we encourage you to find another activity you could do (like a stationary bike or water exercise).  Posture After breast cancer surgery, people frequently sit with rounded shoulders posture because it puts their incisions on slack and feels better. If you sit like this and scar tissue forms in that position, you can become very tight and have pain sitting or standing with good posture. Try to be aware of your posture and sit and stand up tall to heal properly.  Follow up PT: It is recommended you return every 3 months for  the first 2 years following surgery to be assessed on the SOZO machine for an L-Dex score. This helps prevent clinically significant lymphedema in 95% of patients. These follow up screens are 10 minute appointments that you are not billed for.  

## 2021-09-04 NOTE — Therapy (Signed)
OUTPATIENT PHYSICAL THERAPY BREAST CANCER POST OP FOLLOW UP   Patient Name: Jasmine Maddox MRN: [id number] DOB:07/22/1960, 61 y.o., female Today's Date: 09/04/2021   PT End of Session - 09/04/21 1507     Visit Number 2    Number of Visits 14    Date for PT Re-Evaluation 10/16/21    PT Start Time 1506    PT Stop Time 1543    PT Time Calculation (min) 37 min    Activity Tolerance Patient tolerated treatment well    Behavior During Therapy WFL for tasks assessed/performed             Past Medical History:  Diagnosis Date   Cervical cancer (HCC)    Family history of breast cancer    Hypertension    PONV (postoperative nausea and vomiting)    Past Surgical History:  Procedure Laterality Date   BREAST LUMPECTOMY WITH RADIOACTIVE SEED LOCALIZATION Right 03/20/2018   Procedure: RIGHT BREAST LUMPECTOMY WITH RADIOACTIVE SEED LOCALIZATION;  Surgeon: Ingram, Haywood, MD;  Location: [facility];  Service: General;  Laterality: Right;   CHOLECYSTECTOMY     MASTECTOMY W/ SENTINEL NODE BIOPSY Left 07/31/2021   Procedure: LEFT MASTECTOMY WITH LEFT AXILLARY SENTINEL NODE BIOPSY;  Surgeon: Wakefield, Matthew, MD;  Location: [facility];  Service: General;  Laterality: Left;  GEN & PEC BLOCK   PORT-A-CATH REMOVAL Right 07/31/2021   Procedure: PORT REMOVAL;  Surgeon: Wakefield, Matthew, MD;  Location: [facility];  Service: General;  Laterality: Right;   PORTACATH PLACEMENT Right 03/13/2021   Procedure: INSERTION PORT-A-CATH;  Surgeon: Wakefield, Matthew, MD;  Location: [facility];  Service: General;  Laterality: Right;   Patient Active Problem List   Diagnosis Date Noted   S/P mastectomy, left 07/31/2021   Malignant neoplasm of upper-outer quadrant of left breast in female, estrogen receptor positive (HCC) 02/14/2021   Genetic testing 02/25/2018   Family history of breast cancer    Ductal carcinoma in situ (DCIS) of right breast  02/12/2018    PCP: Anna Becker,MD  REFERRING PROVIDER: Wakefield, Matthew MD  REFERRING DIAG: Left Breast Cancer  THERAPY DIAG:  Malignant neoplasm of upper-outer quadrant of left female breast, unspecified estrogen receptor status (HCC)  Stiffness of left shoulder, not elsewhere classified  Abnormal posture  Aftercare following surgery for neoplasm  Muscle weakness (generalized)  Rationale for Evaluation and Treatment Rehabilitation  ONSET DATE: 01/19/2022  SUBJECTIVE:                                                                                                                                                                                             SUBJECTIVE STATEMENT: A little pain but not much. ROM is improving but still hard. Have to put my left arm in first when I put on shirts  Have trouble raising my arm up.Feels very tight under the arm. I think there is a little swelling under my arm. I went back to work today for the first time and did fine.  PERTINENT HISTORY:  She has a history in 2020 of Right  ER/PR positive ductal carcinoma in situ that was treated with right lumpectomy, radiotherapy, and she is now on tamoxifen. She does have a significant family history. She had genetic testing in 2020 that was negative. She had a screening mammogram that showed 4 areas on the left. They were biopsied and determined to be Gr. 3 IDC ER positive at 40%, PR negative, HER2 negative, and Ki-67 is 40%. She had neoadjuvant chemo in February and will then have surgery on 07/31/2021 for left mastectomy with deep Axillary SLNB, followed by radiation.   PATIENT GOALS:  Reassess how my recovery is going related to arm function, pain, and swelling.  PAIN:  Are you having pain? 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   OBJECTIVE:   PATIENT SURVEYS:  QUICK  DASH: 36%  OBSERVATIONS:  Incision healing very nicely without significant swelling.  Several cords noted left axillary region.  POSTURE:  Forward head, rounded shoulders,  LYMPHEDEMA ASSESSMENT:     02/19/2021 09/04/2021    AROM     Right Shoulder Extension 55 Degrees  55    Right Shoulder Flexion 159 Degrees  158    Right Shoulder ABduction 162 Degrees  162    Right Shoulder Internal Rotation 63 Degrees  62    Right Shoulder External Rotation 105 Degrees  95    Left Shoulder Extension 57 Degrees  55    Left Shoulder Flexion 159 Degrees  120    Left Shoulder ABduction 165 Degrees  75    Left Shoulder Internal Rotation 65 Degrees      Left Shoulder External Rotation 95 Degrees                        LYMPHEDEMA/ONCOLOGY QUESTIONNAIRE - 02/19/21 0001       02/19/2021 09/04/2021          Type     Cancer Type Left breast Cancer            Surgeries     Lumpectomy Date  03/20/18   right,  Left mastectomy 07/31/2021    Number Lymph Nodes Removed --   0 in 2020, 0/5 on left 0/5 left          Treatment     Active Chemotherapy Treatment --   pending;to start in Feb. no    Past Chemotherapy Treatment No  yes    Active Radiation Treatment No  no    Past Radiation Treatment Yes  yes    Current Hormone Treatment Yes      Past Hormone Therapy Yes  yes          What other symptoms do you have     Are you Having Heaviness or Tightness No  yes    Are you having Pain No  yes    Are you having pitting edema No  no    Is it Hard or Difficult finding   clothes that fit No  no    Do you have infections No  no    Is there Decreased scar mobility No  yes          Right Upper Extremity Lymphedema 09/04/2021    10 cm Proximal to Olecranon Process 28.5 cm  26.8    Olecranon Process 25 cm  23.8    10 cm Proximal to Ulnar Styloid Process 22.7 cm  21.1    Just Proximal to Ulnar Styloid Process 15.8 cm  14.8    At Base of 2nd Digit 6.4 cm  6.3          Left Upper Extremity Lymphedema      10 cm Proximal to Olecranon Process 27.9 cm  26.4    Olecranon Process 24.6 cm  23.8    10 cm Proximal to Ulnar Styloid Process 21.7 cm  20.5    Just Proximal to Ulnar Styloid Process 15.5 cm  14.9    At Base of 2nd Digit 6.5 cm  6.5                   Surgery type/Date: 07/31/2021, left mastectomy with deep axillary SLNB, prior right lumpectomy 03/20/18 Number of lymph nodes removed: 0/5 left Current/past treatment (chemo, radiation, hormone therapy): neoadjuvant chemo left Other symptoms:  Heaviness/tightness Yes Pain Yes Pitting edema no Infections No Decreased scar mobility No Stemmer sign No   PATIENT EDUCATION:  Education details: scar mobilization, reviewed HEP, discussed SOZO, ABC class Person educated: Patient Education method: Explanation, Demonstration, Verbal cues, and Handouts Education comprehension: verbalized understanding and returned demonstration   HOME EXERCISE PROGRAM:  Reviewed previously given post op HEP. Pt to increase to 3x's per day Instructed in scar massage, set up for ABC class, next SOZO  ASSESSMENT:  CLINICAL IMPRESSION: Pt is s/p left mastectomy with deep axillary SLNB and 0/5 LN's.  She presents with well healed incision. She has significant limitations in left shoulder ROM with tightness and pain at end ranges with several axillary cords noted. She is limited with dressing and bathing and household chores due to ROM restrictions, and will benefit from the ABC class as well to learn precautions for self care and lymphedema  Pt will benefit from skilled therapeutic intervention to improve on the following deficits: Decreased knowledge of precautions, impaired UE functional use, pain, decreased ROM, postural dysfunction.   PT treatment/interventions: ADL/Self care home management, Therapeutic exercises, Therapeutic activity, Neuromuscular re-education, Patient/Family education, Self Care, Joint mobilization, Orthotic/Fit training, Manual lymph  drainage, scar mobilization, Manual therapy, and Re-evaluation     GOALS: Goals reviewed with patient? Yes  LONG TERM GOALS:  (STG=LTG)  GOALS Name Target Date  Goal status  1 Pt will demonstrate she has regained full shoulder ROM and function post operatively compared to baselines or within 5-10 degrees. Baseline: 10/16/2021 IN PROGRESS  2 Pt will improve quick dash to no greater than 12% for improved function 10/16/2021 INITIAL  3 Pt will be able to dress, bathe normal ly and reach into cabinets without difficulty 10/16/2021 INITIAL  4 Pt will attend ABC class and will understand lymphedema precautions 10/16/2021 INITIAL     PLAN: PT FREQUENCY/DURATION: 2x/week x 6 weeks  PLAN FOR NEXT SESSION: STM to left upper quarter, cording release, PROM, review exercises prn, scar mobilization, consider compression sleeve   [facility]  [address], [address]  [place] [address]  [phone]  After Breast Cancer Class It is recommended you   attend the ABC class to be educated on lymphedema risk reduction. This class is free of charge and lasts for 1 hour. It is a 1-time class. You will need to download the Webex app either on your phone or computer. We will send you a link the night before or the morning of the class. You should be able to click on that link to join the class. This is not a confidential class. You don't have to turn your camera on, but other participants may be able to see your email address.  Scar massage You can begin gentle scar massage to you incision sites. Gently place one hand on the incision and move the skin (without sliding on the skin) in various directions. Do this for a few minutes and then you can gently massage either coconut oil or vitamin E cream into the scars.  Compression garment You should continue wearing your compression bra until you feel like you no longer have swelling.  Home exercise Program Continue doing the exercises  you were given until you feel like you can do them without feeling any tightness at the end.   Walking Program Studies show that 30 minutes of walking per day (fast enough to elevate your heart rate) can significantly reduce the risk of a cancer recurrence. If you can't walk due to other medical reasons, we encourage you to find another activity you could do (like a stationary bike or water exercise).  Posture After breast cancer surgery, people frequently sit with rounded shoulders posture because it puts their incisions on slack and feels better. If you sit like this and scar tissue forms in that position, you can become very tight and have pain sitting or standing with good posture. Try to be aware of your posture and sit and stand up tall to heal properly.  Follow up PT: It is recommended you return every 3 months for the first 3 years following surgery to be assessed on the SOZO machine for an L-Dex score. This helps prevent clinically significant lymphedema in 95% of patients. These follow up screens are 10 minute appointments that you are not billed for.  Claris Pong, PT 09/04/2021, 3:59 PM

## 2021-09-12 ENCOUNTER — Ambulatory Visit: Payer: BC Managed Care – PPO

## 2021-09-12 DIAGNOSIS — M6281 Muscle weakness (generalized): Secondary | ICD-10-CM | POA: Diagnosis not present

## 2021-09-12 DIAGNOSIS — M25612 Stiffness of left shoulder, not elsewhere classified: Secondary | ICD-10-CM

## 2021-09-12 DIAGNOSIS — C50412 Malignant neoplasm of upper-outer quadrant of left female breast: Secondary | ICD-10-CM

## 2021-09-12 DIAGNOSIS — Z483 Aftercare following surgery for neoplasm: Secondary | ICD-10-CM | POA: Diagnosis not present

## 2021-09-12 DIAGNOSIS — C50912 Malignant neoplasm of unspecified site of left female breast: Secondary | ICD-10-CM | POA: Diagnosis not present

## 2021-09-12 DIAGNOSIS — R293 Abnormal posture: Secondary | ICD-10-CM

## 2021-09-12 DIAGNOSIS — C50911 Malignant neoplasm of unspecified site of right female breast: Secondary | ICD-10-CM | POA: Diagnosis not present

## 2021-09-12 NOTE — Therapy (Signed)
OUTPATIENT PHYSICAL THERAPY BREAST CANCER POST OP FOLLOW UP   Patient Name: Jasmine Maddox MRN: [id number] DOB:January 27, 1960, 61 y.o., female Today's Date: 09/12/2021   PT End of Session - 09/12/21 0849     Visit Number 3    Number of Visits 14    Date for PT Re-Evaluation 10/16/21    PT Start Time 0800    PT Stop Time 0850    PT Time Calculation (min) 50 min    Activity Tolerance Patient tolerated treatment well    Behavior During Therapy WFL for tasks assessed/performed             Past Medical History:  Diagnosis Date   Cervical cancer ([place])    Family history of breast cancer    Hypertension    PONV (postoperative nausea and vomiting)    Past Surgical History:  Procedure Laterality Date   BREAST LUMPECTOMY WITH RADIOACTIVE SEED LOCALIZATION Right 03/20/2018   Procedure: RIGHT BREAST LUMPECTOMY WITH RADIOACTIVE SEED LOCALIZATION;  Surgeon: [name], MD;  Location: [place];  Service: General;  Laterality: Right;   CHOLECYSTECTOMY     MASTECTOMY W/ SENTINEL NODE BIOPSY Left 07/31/2021   Procedure: LEFT MASTECTOMY WITH LEFT AXILLARY SENTINEL NODE BIOPSY;  Surgeon: Rolm Bookbinder, MD;  Location: [place];  Service: General;  Laterality: Left;  GEN & PEC BLOCK   PORT-A-CATH REMOVAL Right 07/31/2021   Procedure: PORT REMOVAL;  Surgeon: Rolm Bookbinder, MD;  Location: [place];  Service: General;  Laterality: Right;   PORTACATH PLACEMENT Right 03/13/2021   Procedure: INSERTION PORT-A-CATH;  Surgeon: Rolm Bookbinder, MD;  Location: [place];  Service: General;  Laterality: Right;   Patient Active Problem List   Diagnosis Date Noted   S/P mastectomy, left 07/31/2021   Malignant neoplasm of upper-outer quadrant of left breast in female, estrogen receptor positive ([place]) 02/14/2021   Genetic testing 02/25/2018   Family history of breast cancer    Ductal carcinoma in situ (DCIS) of right breast  02/12/2018    PCP: Bluford Main  REFERRING PROVIDER: Rolm Bookbinder MD  REFERRING DIAG: Left Breast Cancer  THERAPY DIAG:  Malignant neoplasm of upper-outer quadrant of left female breast, unspecified estrogen receptor status ([place])  Stiffness of left shoulder, not elsewhere classified  Abnormal posture  Aftercare following surgery for neoplasm  Muscle weakness (generalized)  Rationale for Evaluation and Treatment Rehabilitation  ONSET DATE: 01/19/2022  SUBJECTIVE:  SUBJECTIVE STATEMENT: I have been doing the exercises and the ROM is feeling better. 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 She does have a significant family history. She had genetic testing in 2020 that was negative. She had a screening mammogram that showed 4 areas on the left. They were biopsied and determined to be Gr. 3 IDC ER positive at 40%, PR negative, HER2 negative, and Ki-67 is 40%. She had neoadjuvant chemo in February and will then have surgery on 07/31/2021 for left mastectomy with deep Axillary SLNB, followed by radiation.   PATIENT GOALS:  Reassess how my recovery is going related to arm function, pain, and swelling.  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   OBJECTIVE:   PATIENT SURVEYS:  QUICK DASH: 36%  OBSERVATIONS:  Incision healing very nicely without significant swelling.  Several cords noted left axillary region.  POSTURE:  Forward head, rounded shoulders,  LYMPHEDEMA  ASSESSMENT:     02/19/2021 09/04/2021    AROM     Right Shoulder Extension 55 Degrees  55    Right Shoulder Flexion 159 Degrees  158    Right Shoulder ABduction 162 Degrees  162    Right Shoulder Internal Rotation 63 Degrees  62    Right Shoulder External Rotation 105 Degrees  95    Left Shoulder Extension 57 Degrees  55    Left Shoulder Flexion 159 Degrees  120    Left Shoulder ABduction 165 Degrees  75    Left Shoulder Internal Rotation 65 Degrees      Left Shoulder External Rotation 95 Degrees                        LYMPHEDEMA/ONCOLOGY QUESTIONNAIRE - 02/19/21 0001       02/19/2021 09/04/2021          Type     Cancer Type Left breast Cancer            Surgeries     Lumpectomy Date  03/20/18   right,  Left mastectomy 07/31/2021    Number Lymph Nodes Removed --   0 in 2020, 0/5 on left 0/5 left          Treatment     Active Chemotherapy Treatment --   pending;to start in Feb. no    Past Chemotherapy Treatment No  yes    Active Radiation Treatment No  no    Past Radiation Treatment Yes  yes    Current Hormone Treatment Yes      Past Hormone Therapy Yes  yes          What other symptoms do you have     Are you Having Heaviness or Tightness No  yes    Are you having Pain No  yes    Are you having pitting edema No  no    Is it Hard or Difficult finding clothes that fit No  no    Do you have infections No  no    Is there Decreased scar mobility No  yes          Right Upper Extremity Lymphedema 09/04/2021  10 cm Proximal to Olecranon Process 28.5 cm  26.8    Olecranon Process 25 cm  23.8    10 cm Proximal to Ulnar Styloid Process 22.7 cm  21.1    Just Proximal to Ulnar Styloid Process 15.8 cm  14.8    At [facility] of 2nd Digit 6.4 cm  6.3          Left Upper Extremity Lymphedema     10 cm Proximal to Olecranon Process 27.9 cm  26.4    Olecranon Process 24.6 cm  23.8    10 cm Proximal to Ulnar Styloid Process 21.7 cm  20.5    Just Proximal to Ulnar Styloid Process 15.5  cm  14.9    At [facility] of 2nd Digit 6.5 cm  6.5                   Surgery type/Date: 07/31/2021, left mastectomy with deep axillary SLNB, prior right lumpectomy 03/20/18 Number of lymph nodes removed: 0/5 left Current/past treatment (chemo, radiation, hormone therapy): neoadjuvant chemo left Other symptoms:  Heaviness/tightness Yes Pain Yes Pitting edema no Infections No Decreased scar mobility No Stemmer sign No       PATIENT EDUCATION:  Education details: scar mobilization, reviewed HEP, discussed SOZO, ABC class Person educated: Patient Education method: Explanation, Demonstration, Verbal cues, and Handouts Education comprehension: verbalized understanding and returned demonstration  TREATMENT TODAY  09/12/2021  Soft tissue mobilization to left UT, pectorals, lats in supine and UT, scapular area, serratus, lats in SL with cocoa butter. Scar mobilization to left mastectomy incision area Clasped hands flexion x 5, stargazer x 5, standing wall slides x 5, counter lat stretch x 3 PROM left shoulder flexion, scaption, IR and ER with MFR techniques to left axillary region.  HOME EXERCISE PROGRAM:  Reviewed previously given post op HEP. Pt to increase to 3x's per day Instructed in scar massage, set up for ABC class, next SOZO  ASSESSMENT:  CLINICAL IMPRESSION: Pts ROM continues to be limited.  She learned how to relax better today with exercises which allowed her to do with less pain and improved ROM. She is compliant with HEP.  Pt will benefit from skilled therapeutic intervention to improve on the following deficits: Decreased knowledge of precautions, impaired UE functional use, pain, decreased ROM, postural dysfunction.   PT treatment/interventions: ADL/Self care home management, Therapeutic exercises, Therapeutic activity, Neuromuscular re-education, Patient/Family education, Self Care, Joint mobilization, Orthotic/Fit training, Manual lymph drainage, scar mobilization,  Manual therapy, and Re-evaluation     GOALS: Goals reviewed with patient? Yes  LONG TERM GOALS:  (STG=LTG)  GOALS Name Target Date  Goal status  1 Pt will demonstrate she has regained full shoulder ROM and function post operatively compared to baselines or within 5-10 degrees. Baseline: 10/24/2021 IN PROGRESS  2 Pt will improve quick dash to no greater than 12% for improved function 10/24/2021 INITIAL  3 Pt will be able to dress, bathe normal ly and reach into cabinets without difficulty 10/24/2021 INITIAL  4 Pt will attend ABC class and will understand lymphedema precautions 10/24/2021 INITIAL     PLAN: PT FREQUENCY/DURATION: 2x/week x 6 weeks  PLAN FOR NEXT SESSION: STM to left upper quarter, cording release, PROM, review exercises prn, add wand/pulleys as able scar mobilization, consider compression sleeve   [facility]  [address], [address]  [place] [address]  [phone]  After Breast Cancer Class It is recommended you attend the ABC class to be educated on  lymphedema risk reduction. This class is free of charge and lasts for 1 hour. It is a 1-time class. You will need to download the Webex app either on your phone or computer. We will send you a link the night before or the morning of the class. You should be able to click on that link to join the class. This is not a confidential class. You don't have to turn your camera on, but other participants may be able to see your email address.  Scar massage You can begin gentle scar massage to you incision sites. Gently place one hand on the incision and move the skin (without sliding on the skin) in various directions. Do this for a few minutes and then you can gently massage either coconut oil or vitamin E cream into the scars.  Compression garment You should continue wearing your compression bra until you feel like you no longer have swelling.  Home exercise Program Continue doing the exercises you  were given until you feel like you can do them without feeling any tightness at the end.   Walking Program Studies show that 30 minutes of walking per day (fast enough to elevate your heart rate) can significantly reduce the risk of a cancer recurrence. If you can't walk due to other medical reasons, we encourage you to find another activity you could do (like a stationary bike or water exercise).  Posture After breast cancer surgery, people frequently sit with rounded shoulders posture because it puts their incisions on slack and feels better. If you sit like this and scar tissue forms in that position, you can become very tight and have pain sitting or standing with good posture. Try to be aware of your posture and sit and stand up tall to heal properly.  Follow up PT: It is recommended you return every 3 months for the first 3 years following surgery to be assessed on the SOZO machine for an L-Dex score. This helps prevent clinically significant lymphedema in 95% of patients. These follow up screens are 10 minute appointments that you are not billed for.  Claris Pong, PT 09/12/2021, 8:53 AM

## 2021-09-13 DIAGNOSIS — C50911 Malignant neoplasm of unspecified site of right female breast: Secondary | ICD-10-CM | POA: Diagnosis not present

## 2021-09-13 DIAGNOSIS — C50912 Malignant neoplasm of unspecified site of left female breast: Secondary | ICD-10-CM | POA: Diagnosis not present

## 2021-09-14 ENCOUNTER — Ambulatory Visit: Payer: BC Managed Care – PPO

## 2021-09-14 DIAGNOSIS — R293 Abnormal posture: Secondary | ICD-10-CM | POA: Diagnosis not present

## 2021-09-14 DIAGNOSIS — Z483 Aftercare following surgery for neoplasm: Secondary | ICD-10-CM

## 2021-09-14 DIAGNOSIS — M6281 Muscle weakness (generalized): Secondary | ICD-10-CM

## 2021-09-14 DIAGNOSIS — M25612 Stiffness of left shoulder, not elsewhere classified: Secondary | ICD-10-CM | POA: Diagnosis not present

## 2021-09-14 DIAGNOSIS — C50412 Malignant neoplasm of upper-outer quadrant of left female breast: Secondary | ICD-10-CM

## 2021-09-14 NOTE — Therapy (Signed)
OUTPATIENT PHYSICAL THERAPY BREAST CANCER POST OP FOLLOW UP   Patient Name: Jasmine Maddox MRN: [id number] DOB:03/24/1960, 61 y.o., female Today's Date: 09/14/2021   PT End of Session - 09/14/21 0808     Visit Number 4    Number of Visits 14    Date for PT Re-Evaluation 10/16/21    PT Start Time 0809   late   PT Stop Time 0852    PT Time Calculation (min) 43 min    Activity Tolerance Patient tolerated treatment well    Behavior During Therapy [facility] for tasks assessed/performed             Past Medical History:  Diagnosis Date   Cervical cancer ([place])    Family history of breast cancer    Hypertension    PONV (postoperative nausea and vomiting)    Past Surgical History:  Procedure Laterality Date   BREAST LUMPECTOMY WITH RADIOACTIVE SEED LOCALIZATION Right 03/20/2018   Procedure: RIGHT BREAST LUMPECTOMY WITH RADIOACTIVE SEED LOCALIZATION;  Surgeon: [name], MD;  Location: [place];  Service: General;  Laterality: Right;   CHOLECYSTECTOMY     MASTECTOMY W/ SENTINEL NODE BIOPSY Left 07/31/2021   Procedure: LEFT MASTECTOMY WITH LEFT AXILLARY SENTINEL NODE BIOPSY;  Surgeon: Rolm Bookbinder, MD;  Location: [place];  Service: General;  Laterality: Left;  GEN & PEC BLOCK   PORT-A-CATH REMOVAL Right 07/31/2021   Procedure: PORT REMOVAL;  Surgeon: Rolm Bookbinder, MD;  Location: [place];  Service: General;  Laterality: Right;   PORTACATH PLACEMENT Right 03/13/2021   Procedure: INSERTION PORT-A-CATH;  Surgeon: Rolm Bookbinder, MD;  Location: [place];  Service: General;  Laterality: Right;   Patient Active Problem List   Diagnosis Date Noted   S/P mastectomy, left 07/31/2021   Malignant neoplasm of upper-outer quadrant of left breast in female, estrogen receptor positive ([place]) 02/14/2021   Genetic testing 02/25/2018   Family history of breast cancer    Ductal carcinoma in situ (DCIS) of right breast  02/12/2018    PCP: Bluford Main  REFERRING PROVIDER: Rolm Bookbinder MD  REFERRING DIAG: Left Breast Cancer  THERAPY DIAG:  Malignant neoplasm of upper-outer quadrant of left female breast, unspecified estrogen receptor status ([place])  Stiffness of left shoulder, not elsewhere classified  Abnormal posture  Aftercare following surgery for neoplasm  Muscle weakness (generalized)  Rationale for Evaluation and Treatment Rehabilitation  ONSET DATE: 01/19/2022  SUBJECTIVE:  SUBJECTIVE STATEMENT: I did fine after last visit. 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 She does have a significant family history. She had genetic testing in 2020 that was negative. She had a screening mammogram that showed 4 areas on the left. They were biopsied and determined to be Gr. 3 IDC ER positive at 40%, PR negative, HER2 negative, and Ki-67 is 40%. She had neoadjuvant chemo in February and will then have surgery on 07/31/2021 for left mastectomy with deep Axillary SLNB, followed by radiation.   PATIENT GOALS:  Reassess how my recovery is going related to arm function, pain, and swelling.  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   OBJECTIVE:   PATIENT SURVEYS:  QUICK DASH: 36%  OBSERVATIONS:  Incision healing very nicely without significant swelling.  Several cords noted left axillary region.  POSTURE:  Forward head, rounded shoulders,  LYMPHEDEMA ASSESSMENT:     02/19/2021 09/04/2021     AROM     Right Shoulder Extension 55 Degrees  55    Right Shoulder Flexion 159 Degrees  158    Right Shoulder ABduction 162 Degrees  162    Right Shoulder Internal Rotation 63 Degrees  62    Right Shoulder External Rotation 105 Degrees  95    Left Shoulder Extension 57 Degrees  55    Left Shoulder Flexion 159 Degrees  120    Left Shoulder ABduction 165 Degrees  75    Left Shoulder Internal Rotation 65 Degrees      Left Shoulder External Rotation 95 Degrees                        LYMPHEDEMA/ONCOLOGY QUESTIONNAIRE - 02/19/21 0001       02/19/2021 09/04/2021          Type     Cancer Type Left breast Cancer            Surgeries     Lumpectomy Date  03/20/18   right,  Left mastectomy 07/31/2021    Number Lymph Nodes Removed --   0 in 2020, 0/5 on left 0/5 left          Treatment     Active Chemotherapy Treatment --   pending;to start in Feb. no    Past Chemotherapy Treatment No  yes    Active Radiation Treatment No  no    Past Radiation Treatment Yes  yes    Current Hormone Treatment Yes      Past Hormone Therapy Yes  yes          What other symptoms do you have     Are you Having Heaviness or Tightness No  yes    Are you having Pain No  yes    Are you having pitting edema No  no    Is it Hard or Difficult finding clothes that fit No  no    Do you have infections No  no    Is there Decreased scar mobility No  yes          Right Upper Extremity Lymphedema 09/04/2021    10 cm Proximal to  Olecranon Process 28.5 cm  26.8    Olecranon Process 25 cm  23.8    10 cm Proximal to Ulnar Styloid Process 22.7 cm  21.1    Just Proximal to Ulnar Styloid Process 15.8 cm  14.8    At [facility] of 2nd Digit 6.4 cm  6.3          Left Upper Extremity Lymphedema     10 cm Proximal to Olecranon Process 27.9 cm  26.4    Olecranon Process 24.6 cm  23.8    10 cm Proximal to Ulnar Styloid Process 21.7 cm  20.5    Just Proximal to Ulnar Styloid Process 15.5 cm  14.9    At [facility] of 2nd Digit 6.5 cm   6.5                   Surgery type/Date: 07/31/2021, left mastectomy with deep axillary SLNB, prior right lumpectomy 03/20/18 Number of lymph nodes removed: 0/5 left Current/past treatment (chemo, radiation, hormone therapy): neoadjuvant chemo left Other symptoms:  Heaviness/tightness Yes Pain Yes Pitting edema no Infections No Decreased scar mobility No Stemmer sign No       PATIENT EDUCATION:   Person educated: Patient Education method: Access Code: [email or web address] URL: [email or web address] Date: 09/14/2021 Prepared by: Cheral Almas  Exercises - Supine Shoulder Flexion with [place]  - 1 x daily - 7 x weekly - 1 sets - 5 reps Education comprehension: verbalized understanding and returned demonstration  TREATMENT TODAY 09/14/2021 Overhead pulleys 1:30 flexion,  scaption, abduction visual and VC's for proper form Soft tissue mobilization to left UT/pectorals, lats in supine.. Scar mobilization to left mastectomy incision area Supine wand flexion and scaption x 5 ea PROM left shoulder flexion, scaption, IR and ER with MFR techniques to left axillary region.  09/12/2021 Soft tissue mobilization to left UT, pectorals, lats in supine and UT, scapular area, serratus, lats in SL with cocoa butter. Scar mobilization to left mastectomy incision area, and MFR Clasped hands flexion x 5, stargazer x 5, standing wall slides x 5, counter lat stretch x 3 PROM left shoulder flexion, scaption, IR and ER with MFR techniques to left axillary region.  HOME EXERCISE PROGRAM:  Reviewed previously given post op HEP. Pt to increase to 3x's per day Instructed in scar massage, set up for ABC class, next SOZO  ASSESSMENT:  CLINICAL IMPRESSION: Pt did well with pulleys and supine wand with VC's to relax. Lateral mastectomy incision still very tight/restricted. ROM visibly improved after exercising.   Pt will benefit from skilled therapeutic intervention to improve on the following  deficits: Decreased knowledge of precautions, impaired UE functional use, pain, decreased ROM, postural dysfunction.   PT treatment/interventions: ADL/Self care home management, Therapeutic exercises, Therapeutic activity, Neuromuscular re-education, Patient/Family education, Self Care, Joint mobilization, Orthotic/Fit training, Manual lymph drainage, scar mobilization, Manual therapy, and Re-evaluation     GOALS: Goals reviewed with patient? Yes  LONG TERM GOALS:  (STG=LTG)  GOALS Name Target Date  Goal status  1 Pt will demonstrate she has regained full shoulder ROM and function post operatively compared to baselines or within 5-10 degrees. Baseline: 10/26/2021 IN PROGRESS  2 Pt will improve quick dash to no greater than 12% for improved function 10/26/2021 INITIAL  3 Pt will be able to dress, bathe normal ly and reach into cabinets without difficulty 10/26/2021 INITIAL  4 Pt will attend ABC class and will understand lymphedema precautions 10/26/2021 INITIAL     PLAN: PT FREQUENCY/DURATION: 2x/week x 6  weeks  PLAN FOR NEXT SESSION: STM to left upper quarter, cording release, PROM, review exercises prn, add wand/pulleys as able scar mobilization, consider compression sleeve   [facility]  [address], [address]  [place] [address]  [phone]  After Breast Cancer Class It is recommended you attend the ABC class to be educated on lymphedema risk reduction. This class is free of charge and lasts for 1 hour. It is a 1-time class. You will need to download the Webex app either on your phone or computer. We will send you a link the night before or the morning of the class. You should be able to click on that link to join the class. This is not a confidential class. You don't have to turn your camera on, but other participants may be able to see your email address.  Scar massage You can begin gentle scar massage to you incision sites. Gently place one hand on  the incision and move the skin (without sliding on the skin) in various directions. Do this for a few minutes and then you can gently massage either coconut oil or vitamin E cream into the scars.  Compression garment You should continue wearing your compression bra until you feel like you no longer have swelling.  Home exercise Program Continue doing the exercises you were given until you feel like you can do them without feeling any tightness at the end.   Walking Program Studies show that 30 minutes of walking per day (fast enough to elevate your heart rate) can significantly reduce the risk of a cancer recurrence. If you can't walk due to other medical reasons, we encourage you to find another activity you could do (like a stationary bike or water exercise).  Posture After breast cancer surgery, people frequently sit with rounded shoulders posture because it puts their incisions on slack and feels better. If you sit like this and scar tissue forms in that position, you can become very tight and have pain sitting or standing with good posture. Try to be aware of your posture and sit and stand up tall to heal properly.  Follow up PT: It is recommended you return every 3 months for the first 3 years following surgery to be assessed on the SOZO machine for an L-Dex score. This helps prevent clinically significant lymphedema in 95% of patients. These follow up screens are 10 minute appointments that you are not billed for.  Claris Pong, PT 09/14/2021, 8:57 AM

## 2021-09-14 NOTE — Patient Instructions (Signed)
BC 

## 2021-09-19 ENCOUNTER — Ambulatory Visit: Payer: BC Managed Care – PPO

## 2021-09-19 DIAGNOSIS — C50412 Malignant neoplasm of upper-outer quadrant of left female breast: Secondary | ICD-10-CM

## 2021-09-19 DIAGNOSIS — M6281 Muscle weakness (generalized): Secondary | ICD-10-CM

## 2021-09-19 DIAGNOSIS — R293 Abnormal posture: Secondary | ICD-10-CM | POA: Diagnosis not present

## 2021-09-19 DIAGNOSIS — Z483 Aftercare following surgery for neoplasm: Secondary | ICD-10-CM

## 2021-09-19 DIAGNOSIS — M25612 Stiffness of left shoulder, not elsewhere classified: Secondary | ICD-10-CM

## 2021-09-19 NOTE — Therapy (Signed)
OUTPATIENT PHYSICAL THERAPY BREAST CANCER POST OP FOLLOW UP   Patient Name: Jasmine Maddox MRN: [id number] DOB:01-10-1961, 61 y.o., female Today's Date: 09/19/2021   PT End of Session - 09/19/21 0757     Visit Number 5    Number of Visits 14    Date for PT Re-Evaluation 10/16/21    PT Start Time 0804    PT Stop Time 0854    PT Time Calculation (min) 50 min    Activity Tolerance Patient tolerated treatment well    Behavior During Therapy [facility] for tasks assessed/performed             Past Medical History:  Diagnosis Date   Cervical cancer ([place])    Family history of breast cancer    Hypertension    PONV (postoperative nausea and vomiting)    Past Surgical History:  Procedure Laterality Date   BREAST LUMPECTOMY WITH RADIOACTIVE SEED LOCALIZATION Right 03/20/2018   Procedure: RIGHT BREAST LUMPECTOMY WITH RADIOACTIVE SEED LOCALIZATION;  Surgeon: [name], MD;  Location: [place];  Service: General;  Laterality: Right;   CHOLECYSTECTOMY     MASTECTOMY W/ SENTINEL NODE BIOPSY Left 07/31/2021   Procedure: LEFT MASTECTOMY WITH LEFT AXILLARY SENTINEL NODE BIOPSY;  Surgeon: Rolm Bookbinder, MD;  Location: [place];  Service: General;  Laterality: Left;  GEN & PEC BLOCK   PORT-A-CATH REMOVAL Right 07/31/2021   Procedure: PORT REMOVAL;  Surgeon: Rolm Bookbinder, MD;  Location: [place];  Service: General;  Laterality: Right;   PORTACATH PLACEMENT Right 03/13/2021   Procedure: INSERTION PORT-A-CATH;  Surgeon: Rolm Bookbinder, MD;  Location: [place];  Service: General;  Laterality: Right;   Patient Active Problem List   Diagnosis Date Noted   S/P mastectomy, left 07/31/2021   Malignant neoplasm of upper-outer quadrant of left breast in female, estrogen receptor positive ([place]) 02/14/2021   Genetic testing 02/25/2018   Family history of breast cancer    Ductal carcinoma in situ (DCIS) of right breast  02/12/2018    PCP: Bluford Main  REFERRING PROVIDER: Rolm Bookbinder MD  REFERRING DIAG: Left Breast Cancer  THERAPY DIAG:  Malignant neoplasm of upper-outer quadrant of left female breast, unspecified estrogen receptor status ([place])  Stiffness of left shoulder, not elsewhere classified  Abnormal posture  Aftercare following surgery for neoplasm  Muscle weakness (generalized)  Rationale for Evaluation and Treatment Rehabilitation  ONSET DATE: 01/19/2022  SUBJECTIVE:  SUBJECTIVE STATEMENT: I didn't try the dowel exercises because I didn' have anything to use, but my husband reminded me about the cane. I am feeling a lot looser. Using left arm more at home .  PERTINENT HISTORY:  She has a history in 2020 of Right  ER/PR positive ductal carcinoma in situ that was treated with right lumpectomy, radiotherapy, and she is now on tamoxifen. She does have a significant family history. She had genetic testing in 2020 that was negative. She had a screening mammogram that showed 4 areas on the left. They were biopsied and determined to be Gr. 3 IDC ER positive at 40%, PR negative, HER2 negative, and Ki-67 is 40%. She had neoadjuvant chemo in February and will then have surgery on 07/31/2021 for left mastectomy with deep Axillary SLNB, followed by radiation.   PATIENT GOALS:  Reassess how my recovery is going related to arm function, pain, and swelling.  PAIN:  Are you having pain?No only with ROM in axillary region is tight.  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   OBJECTIVE:   PATIENT SURVEYS:  QUICK DASH: 36%  OBSERVATIONS:  Incision healing very nicely without significant swelling.  Several cords noted left  axillary region.  POSTURE:  Forward head, rounded shoulders,  LYMPHEDEMA ASSESSMENT:     02/19/2021 09/04/2021 09/19/2021    AROM      Right Shoulder Extension 55 Degrees  55     Right Shoulder Flexion 159 Degrees  158     Right Shoulder ABduction 162 Degrees  162     Right Shoulder Internal Rotation 63 Degrees  62     Right Shoulder External Rotation 105 Degrees  95     Left Shoulder Extension 57 Degrees  55     Left Shoulder Flexion 159 Degrees  120 145    Left Shoulder ABduction 165 Degrees  75 130    Left Shoulder Internal Rotation 65 Degrees       Left Shoulder External Rotation 95 Degrees                         LYMPHEDEMA/ONCOLOGY QUESTIONNAIRE - 02/19/21 0001       02/19/2021 09/04/2021          Type     Cancer Type Left breast Cancer            Surgeries     Lumpectomy Date  03/20/18   right,  Left mastectomy 07/31/2021    Number Lymph Nodes Removed --   0 in 2020, 0/5 on left 0/5 left          Treatment     Active Chemotherapy Treatment --   pending;to start in Feb. no    Past Chemotherapy Treatment No  yes    Active Radiation Treatment No  no    Past Radiation Treatment Yes  yes    Current Hormone Treatment Yes      Past Hormone Therapy Yes  yes          What other symptoms do you have     Are you Having Heaviness or Tightness No  yes    Are you having Pain No  yes    Are you having pitting edema No  no    Is it Hard or Difficult finding clothes that fit No  no    Do you have  infections No  no    Is there Decreased scar mobility No  yes          Right Upper Extremity Lymphedema 09/04/2021    10 cm Proximal to Olecranon Process 28.5 cm  26.8    Olecranon Process 25 cm  23.8    10 cm Proximal to Ulnar Styloid Process 22.7 cm  21.1    Just Proximal to Ulnar Styloid Process 15.8 cm  14.8    At [facility] of 2nd Digit 6.4 cm  6.3          Left Upper Extremity Lymphedema     10 cm Proximal to Olecranon Process 27.9 cm  26.4    Olecranon Process 24.6 cm  23.8     10 cm Proximal to Ulnar Styloid Process 21.7 cm  20.5    Just Proximal to Ulnar Styloid Process 15.5 cm  14.9    At [facility] of 2nd Digit 6.5 cm  6.5                   Surgery type/Date: 07/31/2021, left mastectomy with deep axillary SLNB, prior right lumpectomy 03/20/18 Number of lymph nodes removed: 0/5 left Current/past treatment (chemo, radiation, hormone therapy): neoadjuvant chemo left Other symptoms:  Heaviness/tightness Yes Pain Yes Pitting edema no Infections No Decreased scar mobility No Stemmer sign No       PATIENT EDUCATION:   Person educated: Patient Education method: Access Code: [email or web address] URL: [email or web address] Date: 09/14/2021 Prepared by: Cheral Almas  Exercises - Supine Shoulder Flexion with Dowel  - 1 x daily - 7 x weekly - 1 sets - 5 reps Education comprehension: verbalized understanding and returned demonstration  TREATMENT TODAY 09/19/2021 Overhead pulleys 1:30 flexion, scaption, abduction x 1:30 Soft tissue mobilization to left UT/pectorals, lats in supine.. Scar mobilization to left mastectomy incision area Supine wand flexion and scaption x 5 ea PROM left shoulder flexion, scaption, IR and ER with MFR techniques to left axillary region Supine AROM flex, scaption, horizontal abduction x 3  09/14/2021 Overhead pulleys 1:30 flexion,  scaption, abduction visual and VC's for proper form Soft tissue mobilization to left UT/pectorals, lats in supine.. Scar mobilization to left mastectomy incision area Supine wand flexion and scaption x 5 ea PROM left shoulder flexion, scaption, IR and ER with MFR techniques to left axillary region.  09/12/2021 Soft tissue mobilization to left UT, pectorals, lats in supine and UT, scapular area, serratus, lats in SL with cocoa butter. Scar mobilization to left mastectomy incision area, and MFR Clasped hands flexion x 5, stargazer x 5, standing wall slides x 5, counter lat stretch x 3 PROM left  shoulder flexion, scaption, abduction, IR and ER with MFR techniques to left axillary region.  HOME EXERCISE PROGRAM:  Reviewed previously given post op HEP. Pt to increase to 3x's per day Instructed in scar massage, set up for ABC class, next SOZO  ASSESSMENT:  CLINICAL IMPRESSION: Pts lateral incision remains very tight and restricting with overhead movements however, pt made good improvements with AROM when measured today. (25 degrees better for flexion, 55 degrees better for abduction)     Pt will benefit from skilled therapeutic intervention to improve on the following deficits: Decreased knowledge of precautions, impaired UE functional use, pain, decreased ROM, postural dysfunction.   PT treatment/interventions: ADL/Self care home management, Therapeutic exercises, Therapeutic activity, Neuromuscular re-education, Patient/Family education, Self Care, Joint mobilization, Orthotic/Fit training, Manual lymph drainage, scar mobilization, Manual therapy, and Re-evaluation     GOALS: Goals  reviewed with patient? Yes  LONG TERM GOALS:  (STG=LTG)  GOALS Name Target Date  Goal status  1 Pt will demonstrate she has regained full shoulder ROM and function post operatively compared to baselines or within 5-10 degrees. Baseline: 10/31/2021 IN PROGRESS  2 Pt will improve quick dash to no greater than 12% for improved function 10/31/2021 INITIAL  3 Pt will be able to dress, bathe normal ly and reach into cabinets without difficulty 10/31/2021 INITIAL  4 Pt will attend ABC class and will understand lymphedema precautions 10/31/2021 INITIAL     PLAN: PT FREQUENCY/DURATION: 2x/week x 6 weeks  PLAN FOR NEXT SESSION: STM to left upper quarter, cording release, PROM, review exercises prn, AROM supine 3 D, scar mobilization, consider compression sleeve   [facility]  [address], [address]  [place] [address]  [phone]  After Breast Cancer Class It is  recommended you attend the ABC class to be educated on lymphedema risk reduction. This class is free of charge and lasts for 1 hour. It is a 1-time class. You will need to download the Webex app either on your phone or computer. We will send you a link the night before or the morning of the class. You should be able to click on that link to join the class. This is not a confidential class. You don't have to turn your camera on, but other participants may be able to see your email address.  Scar massage You can begin gentle scar massage to you incision sites. Gently place one hand on the incision and move the skin (without sliding on the skin) in various directions. Do this for a few minutes and then you can gently massage either coconut oil or vitamin E cream into the scars.  Compression garment You should continue wearing your compression bra until you feel like you no longer have swelling.  Home exercise Program Continue doing the exercises you were given until you feel like you can do them without feeling any tightness at the end.   Walking Program Studies show that 30 minutes of walking per day (fast enough to elevate your heart rate) can significantly reduce the risk of a cancer recurrence. If you can't walk due to other medical reasons, we encourage you to find another activity you could do (like a stationary bike or water exercise).  Posture After breast cancer surgery, people frequently sit with rounded shoulders posture because it puts their incisions on slack and feels better. If you sit like this and scar tissue forms in that position, you can become very tight and have pain sitting or standing with good posture. Try to be aware of your posture and sit and stand up tall to heal properly.  Follow up PT: It is recommended you return every 3 months for the first 3 years following surgery to be assessed on the SOZO machine for an L-Dex score. This helps prevent clinically significant  lymphedema in 95% of patients. These follow up screens are 10 minute appointments that you are not billed for.  Claris Pong, PT 09/19/2021, 8:57 AM

## 2021-09-21 ENCOUNTER — Ambulatory Visit: Payer: BC Managed Care – PPO | Attending: General Surgery

## 2021-09-21 DIAGNOSIS — R293 Abnormal posture: Secondary | ICD-10-CM | POA: Diagnosis present

## 2021-09-21 DIAGNOSIS — C50412 Malignant neoplasm of upper-outer quadrant of left female breast: Secondary | ICD-10-CM | POA: Insufficient documentation

## 2021-09-21 DIAGNOSIS — M25612 Stiffness of left shoulder, not elsewhere classified: Secondary | ICD-10-CM | POA: Diagnosis present

## 2021-09-21 DIAGNOSIS — M6281 Muscle weakness (generalized): Secondary | ICD-10-CM | POA: Diagnosis present

## 2021-09-21 DIAGNOSIS — Z483 Aftercare following surgery for neoplasm: Secondary | ICD-10-CM | POA: Diagnosis present

## 2021-09-21 NOTE — Therapy (Signed)
OUTPATIENT PHYSICAL THERAPY BREAST CANCER POST OP FOLLOW UP   Patient Name: Jasmine Maddox MRN: [id number] DOB:01-17-1961, 61 y.o., female Today's Date: 09/21/2021   PT End of Session - 09/21/21 0748     Visit Number 6    Number of Visits 14    Date for PT Re-Evaluation 10/16/21    PT Start Time 0752    PT Stop Time 0845    PT Time Calculation (min) 53 min    Activity Tolerance Patient tolerated treatment well    Behavior During Therapy [facility] for tasks assessed/performed             Past Medical History:  Diagnosis Date   Cervical cancer ([place])    Family history of breast cancer    Hypertension    PONV (postoperative nausea and vomiting)    Past Surgical History:  Procedure Laterality Date   BREAST LUMPECTOMY WITH RADIOACTIVE SEED LOCALIZATION Right 03/20/2018   Procedure: RIGHT BREAST LUMPECTOMY WITH RADIOACTIVE SEED LOCALIZATION;  Surgeon: [name], MD;  Location: [place];  Service: General;  Laterality: Right;   CHOLECYSTECTOMY     MASTECTOMY W/ SENTINEL NODE BIOPSY Left 07/31/2021   Procedure: LEFT MASTECTOMY WITH LEFT AXILLARY SENTINEL NODE BIOPSY;  Surgeon: Rolm Bookbinder, MD;  Location: [place];  Service: General;  Laterality: Left;  GEN & PEC BLOCK   PORT-A-CATH REMOVAL Right 07/31/2021   Procedure: PORT REMOVAL;  Surgeon: Rolm Bookbinder, MD;  Location: [place];  Service: General;  Laterality: Right;   PORTACATH PLACEMENT Right 03/13/2021   Procedure: INSERTION PORT-A-CATH;  Surgeon: Rolm Bookbinder, MD;  Location: [place];  Service: General;  Laterality: Right;   Patient Active Problem List   Diagnosis Date Noted   S/P mastectomy, left 07/31/2021   Malignant neoplasm of upper-outer quadrant of left breast in female, estrogen receptor positive ([place]) 02/14/2021   Genetic testing 02/25/2018   Family history of breast cancer    Ductal carcinoma in situ (DCIS) of right breast  02/12/2018    PCP: Bluford Main  REFERRING PROVIDER: Rolm Bookbinder MD  REFERRING DIAG: Left Breast Cancer  THERAPY DIAG:  Malignant neoplasm of upper-outer quadrant of left female breast, unspecified estrogen receptor status ([place])  Stiffness of left shoulder, not elsewhere classified  Abnormal posture  Aftercare following surgery for neoplasm  Muscle weakness (generalized)  Rationale for Evaluation and Treatment Rehabilitation  ONSET DATE: 01/19/2022  SUBJECTIVE:  SUBJECTIVE STATEMENT:  I did the exercises with the cane at home. I would like to get a sleeve because I fly a lot.   PERTINENT HISTORY:  She has a history in 2020 of Right  ER/PR positive ductal carcinoma in situ that was treated with right lumpectomy, radiotherapy, and she is now on tamoxifen. She does have a significant family history. She had genetic testing in 2020 that was negative. She had a screening mammogram that showed 4 areas on the left. They were biopsied and determined to be Gr. 3 IDC ER positive at 40%, PR negative, HER2 negative, and Ki-67 is 40%. She had neoadjuvant chemo in February and will then have surgery on 07/31/2021 for left mastectomy with deep Axillary SLNB, followed by radiation.   PATIENT GOALS:  Reassess how my recovery is going related to arm function, pain, and swelling.  PAIN:  Are you having pain?No only with ROM in axillary region is tight.  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   OBJECTIVE:   PATIENT SURVEYS:  QUICK DASH: 36%  OBSERVATIONS:  Incision healing very nicely without significant swelling.  Several cords noted left axillary region.  POSTURE:  Forward head, rounded  shoulders,  LYMPHEDEMA ASSESSMENT:     02/19/2021 09/04/2021 09/19/2021    AROM      Right Shoulder Extension 55 Degrees  55     Right Shoulder Flexion 159 Degrees  158     Right Shoulder ABduction 162 Degrees  162     Right Shoulder Internal Rotation 63 Degrees  62     Right Shoulder External Rotation 105 Degrees  95     Left Shoulder Extension 57 Degrees  55     Left Shoulder Flexion 159 Degrees  120 145    Left Shoulder ABduction 165 Degrees  75 130    Left Shoulder Internal Rotation 65 Degrees       Left Shoulder External Rotation 95 Degrees                         LYMPHEDEMA/ONCOLOGY QUESTIONNAIRE - 02/19/21 0001       02/19/2021 09/04/2021          Type     Cancer Type Left breast Cancer            Surgeries     Lumpectomy Date  03/20/18   right,  Left mastectomy 07/31/2021    Number Lymph Nodes Removed --   0 in 2020, 0/5 on left 0/5 left          Treatment     Active Chemotherapy Treatment --   pending;to start in Feb. no    Past Chemotherapy Treatment No  yes    Active Radiation Treatment No  no    Past Radiation Treatment Yes  yes    Current Hormone Treatment Yes      Past Hormone Therapy Yes  yes          What other symptoms do you have     Are you Having Heaviness or Tightness No  yes    Are you having Pain No  yes    Are you having pitting edema No  no    Is it Hard or Difficult finding clothes that fit No  no    Do you have infections No  no    Is there Decreased scar  mobility No  yes          Right Upper Extremity Lymphedema 09/04/2021    10 cm Proximal to Olecranon Process 28.5 cm  26.8    Olecranon Process 25 cm  23.8    10 cm Proximal to Ulnar Styloid Process 22.7 cm  21.1    Just Proximal to Ulnar Styloid Process 15.8 cm  14.8    At [facility] of 2nd Digit 6.4 cm  6.3          Left Upper Extremity Lymphedema     10 cm Proximal to Olecranon Process 27.9 cm  26.4    Olecranon Process 24.6 cm  23.8    10 cm Proximal to Ulnar Styloid Process 21.7 cm   20.5    Just Proximal to Ulnar Styloid Process 15.5 cm  14.9    At Base of 2nd Digit 6.5 cm  6.5                   Surgery type/Date: 07/31/2021, left mastectomy with deep axillary SLNB, prior right lumpectomy 03/20/18 Number of lymph nodes removed: 0/5 left Current/past treatment (chemo, radiation, hormone therapy): neoadjuvant chemo left Other symptoms:  Heaviness/tightness Yes Pain Yes Pitting edema no Infections No Decreased scar mobility No Stemmer sign No       PATIENT EDUCATION:   Person educated: Patient Education method: Access Code: [email or web address] URL: [email or web address] Date: 09/14/2021 Prepared by: Cheral Almas  Exercises - Supine Shoulder Flexion with Dowel  - 1 x daily - 7 x weekly - 1 sets - 5 reps Education comprehension: verbalized understanding and returned demonstration  TREATMENT TODAY  09/21/2021 Overhead pulleys 1:30 flexion, scaption, abduction x 1:30, Ball rolls x 10 flexion, x 5 abduction Soft tissue mobilization to left UT/pectorals, lats in supine and UT, scapular area in SL. Scar mobilization to left mastectomy incision area Supine wand flexion and scaption x 5 ea PROM left shoulder flexion, scaption, IR and ER with MFR techniques to left axillary region Supine AROM flex, scaption, horizontal abduction x 5  09/19/2021 Overhead pulleys 1:30 flexion, scaption, abduction x 1:30 Soft tissue mobilization to left UT/pectorals, lats in supine.. Scar mobilization to left mastectomy incision area Supine wand flexion and scaption x 5 ea PROM left shoulder flexion, scaption, IR and ER with MFR techniques to left axillary region Supine AROM flex, scaption, horizontal abduction x 3  09/14/2021 Overhead pulleys 1:30 flexion,  scaption, abduction visual and VC's for proper form Soft tissue mobilization to left UT/pectorals, lats in supine.. Scar mobilization to left mastectomy incision area Supine wand flexion and scaption x 5 ea PROM  left shoulder flexion, scaption, IR and ER with MFR techniques to left axillary region.  09/12/2021 Soft tissue mobilization to left UT, pectorals, lats in supine and UT, scapular area, serratus, lats in SL with cocoa butter. Scar mobilization to left mastectomy incision area, and MFR Clasped hands flexion x 5, stargazer x 5, standing wall slides x 5, counter lat stretch x 3 PROM left shoulder flexion, scaption, abduction, IR and ER with MFR techniques to left axillary region.  HOME EXERCISE PROGRAM:  Reviewed previously given post op HEP. Pt to increase to 3x's per day Instructed in scar massage, set up for ABC class, next SOZO  ASSESSMENT:  CLINICAL IMPRESSION: Pt did exceptionally well with ROM after warm up and manual therapy. Significant improvement especially with scaption and abduction noticeable with wand exs and PROM. Sent demographics to [facility] with pt. permission    Pt will  benefit from skilled therapeutic intervention to improve on the following deficits: Decreased knowledge of precautions, impaired UE functional use, pain, decreased ROM, postural dysfunction.   PT treatment/interventions: ADL/Self care home management, Therapeutic exercises, Therapeutic activity, Neuromuscular re-education, Patient/Family education, Self Care, Joint mobilization, Orthotic/Fit training, Manual lymph drainage, scar mobilization, Manual therapy, and Re-evaluation     GOALS: Goals reviewed with patient? Yes  LONG TERM GOALS:  (STG=LTG)  GOALS Name Target Date  Goal status  1 Pt will demonstrate she has regained full shoulder ROM and function post operatively compared to baselines or within 5-10 degrees. Baseline: 11/02/2021 IN PROGRESS  2 Pt will improve quick dash to no greater than 12% for improved function 11/02/2021 INITIAL  3 Pt will be able to dress, bathe normal ly and reach into cabinets without difficulty 11/02/2021 INITIAL  4 Pt will attend ABC class and will understand  lymphedema precautions 09/21/2021 MET INITIAL     PLAN: PT FREQUENCY/DURATION: 2x/week x 6 weeks  PLAN FOR NEXT SESSION: STM to left upper quarter, cording release, PROM, review exercises prn, AROM supine 3 D, scar mobilization, consider compression sleeve, supine scap series Demographics sent to [facility] 09/21/2021  [facility]  [address], [address]  [place] [address]  [phone]  After Breast Cancer Class It is recommended you attend the ABC class to be educated on lymphedema risk reduction. This class is free of charge and lasts for 1 hour. It is a 1-time class. You will need to download the Webex app either on your phone or computer. We will send you a link the night before or the morning of the class. You should be able to click on that link to join the class. This is not a confidential class. You don't have to turn your camera on, but other participants may be able to see your email address.  Scar massage You can begin gentle scar massage to you incision sites. Gently place one hand on the incision and move the skin (without sliding on the skin) in various directions. Do this for a few minutes and then you can gently massage either coconut oil or vitamin E cream into the scars.  Compression garment You should continue wearing your compression bra until you feel like you no longer have swelling.  Home exercise Program Continue doing the exercises you were given until you feel like you can do them without feeling any tightness at the end.   Walking Program Studies show that 30 minutes of walking per day (fast enough to elevate your heart rate) can significantly reduce the risk of a cancer recurrence. If you can't walk due to other medical reasons, we encourage you to find another activity you could do (like a stationary bike or water exercise).  Posture After breast cancer surgery, people frequently sit with rounded shoulders posture because it puts their  incisions on slack and feels better. If you sit like this and scar tissue forms in that position, you can become very tight and have pain sitting or standing with good posture. Try to be aware of your posture and sit and stand up tall to heal properly.  Follow up PT: It is recommended you return every 3 months for the first 3 years following surgery to be assessed on the SOZO machine for an L-Dex score. This helps prevent clinically significant lymphedema in 95% of patients. These follow up screens are 10 minute appointments that you are not billed for.  Claris Pong, PT  09/21/2021, 8:53 AM

## 2021-09-26 ENCOUNTER — Ambulatory Visit: Payer: BC Managed Care – PPO

## 2021-09-26 DIAGNOSIS — M6281 Muscle weakness (generalized): Secondary | ICD-10-CM

## 2021-09-26 DIAGNOSIS — C50412 Malignant neoplasm of upper-outer quadrant of left female breast: Secondary | ICD-10-CM

## 2021-09-26 DIAGNOSIS — R293 Abnormal posture: Secondary | ICD-10-CM

## 2021-09-26 DIAGNOSIS — M25612 Stiffness of left shoulder, not elsewhere classified: Secondary | ICD-10-CM

## 2021-09-26 DIAGNOSIS — Z483 Aftercare following surgery for neoplasm: Secondary | ICD-10-CM

## 2021-09-26 NOTE — Therapy (Signed)
OUTPATIENT PHYSICAL THERAPY BREAST CANCER POST OP FOLLOW UP   Patient Name: Jasmine Maddox MRN: [id number] DOB:31-May-1960, 61 y.o., female Today's Date: 09/26/2021   PT End of Session - 09/26/21 0803     Visit Number 7    Number of Visits 14    Date for PT Re-Evaluation 10/16/21    PT Start Time 0808   pt late   PT Stop Time 0855    PT Time Calculation (min) 47 min    Activity Tolerance Patient tolerated treatment well    Behavior During Therapy [facility] for tasks assessed/performed             Past Medical History:  Diagnosis Date   Cervical cancer ([place])    Family history of breast cancer    Hypertension    PONV (postoperative nausea and vomiting)    Past Surgical History:  Procedure Laterality Date   BREAST LUMPECTOMY WITH RADIOACTIVE SEED LOCALIZATION Right 03/20/2018   Procedure: RIGHT BREAST LUMPECTOMY WITH RADIOACTIVE SEED LOCALIZATION;  Surgeon: [name], MD;  Location: [facility];  Service: General;  Laterality: Right;   CHOLECYSTECTOMY     MASTECTOMY W/ SENTINEL NODE BIOPSY Left 07/31/2021   Procedure: LEFT MASTECTOMY WITH LEFT AXILLARY SENTINEL NODE BIOPSY;  Surgeon: Rolm Bookbinder, MD;  Location: [facility];  Service: General;  Laterality: Left;  GEN & PEC BLOCK   PORT-A-CATH REMOVAL Right 07/31/2021   Procedure: PORT REMOVAL;  Surgeon: Rolm Bookbinder, MD;  Location: [facility];  Service: General;  Laterality: Right;   PORTACATH PLACEMENT Right 03/13/2021   Procedure: INSERTION PORT-A-CATH;  Surgeon: Rolm Bookbinder, MD;  Location: [facility];  Service: General;  Laterality: Right;   Patient Active Problem List   Diagnosis Date Noted   S/P mastectomy, left 07/31/2021   Malignant neoplasm of upper-outer quadrant of left breast in female, estrogen receptor positive ([place]) 02/14/2021   Genetic testing 02/25/2018   Family history of breast cancer    Ductal carcinoma in situ (DCIS) of right breast  02/12/2018    PCP: [facility]  REFERRING PROVIDER: Rolm Bookbinder MD  REFERRING DIAG: Left Breast Cancer  THERAPY DIAG:  Malignant neoplasm of upper-outer quadrant of left female breast, unspecified estrogen receptor status ([place])  Stiffness of left shoulder, not elsewhere classified  Abnormal posture  Aftercare following surgery for neoplasm  Muscle weakness (generalized)  Rationale for Evaluation and Treatment Rehabilitation  ONSET DATE: 01/19/2022  SUBJECTIVE:  SUBJECTIVE STATEMENT: My ROM continues to improve.  PERTINENT HISTORY:  She has a history in 2020 of Right  ER/PR positive ductal carcinoma in situ that was treated with right lumpectomy, radiotherapy, and she is now on tamoxifen. She does have a significant family history. She had genetic testing in 2020 that was negative. She had a screening mammogram that showed 4 areas on the left. They were biopsied and determined to be Gr. 3 IDC ER positive at 40%, PR negative, HER2 negative, and Ki-67 is 40%. She had neoadjuvant chemo in February and will then have surgery on 07/31/2021 for left mastectomy with deep Axillary SLNB, followed by radiation.   PATIENT GOALS:  Reassess how my recovery is going related to arm function, pain, and swelling.  PAIN:  Are you having pain?No only with ROM in axillary region is tight.  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   OBJECTIVE:   PATIENT SURVEYS:  QUICK DASH: 36%  OBSERVATIONS:  Incision healing very nicely without significant swelling.  Several cords noted left axillary region.  POSTURE:  Forward head, rounded shoulders,  LYMPHEDEMA ASSESSMENT:     02/19/2021 09/04/2021 09/19/2021 09/26/2021     AROM       Right Shoulder Extension 55 Degrees  55      Right Shoulder Flexion 159 Degrees  158      Right Shoulder ABduction 162 Degrees  162      Right Shoulder Internal Rotation 63 Degrees  62      Right Shoulder External Rotation 105 Degrees  95      Left Shoulder Extension 57 Degrees  55      Left Shoulder Flexion 159 Degrees  120 145 148    Left Shoulder ABduction 165 Degrees  75 130 148    Left Shoulder Internal Rotation 65 Degrees        Left Shoulder External Rotation 95 Degrees                          LYMPHEDEMA/ONCOLOGY QUESTIONNAIRE - 02/19/21 0001       02/19/2021 09/04/2021          Type     Cancer Type Left breast Cancer            Surgeries     Lumpectomy Date  03/20/18   right,  Left mastectomy 07/31/2021    Number Lymph Nodes Removed --   0 in 2020, 0/5 on left 0/5 left          Treatment     Active Chemotherapy Treatment --   pending;to start in Feb. no    Past Chemotherapy Treatment No  yes    Active Radiation Treatment No  no    Past Radiation Treatment Yes  yes    Current Hormone Treatment Yes      Past Hormone Therapy Yes  yes          What other symptoms do you have     Are you Having Heaviness or Tightness No  yes    Are you having Pain No  yes    Are you having pitting edema No  no    Is it Hard or Difficult finding clothes that fit No  no    Do you have infections No  no    Is there Decreased scar mobility No  yes  Right Upper Extremity Lymphedema 09/04/2021    10 cm Proximal to Olecranon Process 28.5 cm  26.8    Olecranon Process 25 cm  23.8    10 cm Proximal to Ulnar Styloid Process 22.7 cm  21.1    Just Proximal to Ulnar Styloid Process 15.8 cm  14.8    At [facility] of 2nd Digit 6.4 cm  6.3          Left Upper Extremity Lymphedema     10 cm Proximal to Olecranon Process 27.9 cm  26.4    Olecranon Process 24.6 cm  23.8    10 cm Proximal to Ulnar Styloid Process 21.7 cm  20.5    Just Proximal to Ulnar Styloid Process 15.5 cm  14.9     At [facility] of 2nd Digit 6.5 cm  6.5                   Surgery type/Date: 07/31/2021, left mastectomy with deep axillary SLNB, prior right lumpectomy 03/20/18 Number of lymph nodes removed: 0/5 left Current/past treatment (chemo, radiation, hormone therapy): neoadjuvant chemo left Other symptoms:  Heaviness/tightness Yes Pain Yes Pitting edema no Infections No Decreased scar mobility No Stemmer sign No       PATIENT EDUCATION:   Person educated: Patient Education method: Access Code: [email or web address] URL: [email or web address] Date: 09/14/2021 Prepared by: Cheral Almas  Exercises - Supine Shoulder Flexion with Dowel  - 1 x daily - 7 x weekly - 1 sets - 5 reps Education comprehension: verbalized understanding and returned demonstration  TREATMENT TODAY  09/26/2021 Discussed compression sleeve and pt will consider getting at [place] so that gauntlet is covered. oft tissue mobilization to left UT/pectorals, lats in supine and Scar mobilization to left mastectomy incision area Supine wand flexion and scaption x 5 ea PROM left shoulder flexion, scaption, IR and ER with MFR techniques to left axillary region Supine AROM flex, scaption, horizontal abduction x 5   09/21/2021 Overhead pulleys 1:30 flexion, scaption, abduction x 1:30, Ball rolls x 10 flexion, x 5 abduction Soft tissue mobilization to left UT/pectorals, lats in supine and UT, scapular area in SL. Scar mobilization to left mastectomy incision area Supine wand flexion and scaption x 5 ea PROM left shoulder flexion, scaption, IR and ER with MFR techniques to left axillary region Supine AROM flex, scaption, horizontal abduction x 5  09/19/2021 Overhead pulleys 1:30 flexion, scaption, abduction x 1:30 Soft tissue mobilization to left UT/pectorals, lats in supine.. Scar mobilization to left mastectomy incision area Supine wand flexion and scaption x 5 ea PROM left shoulder flexion, scaption, IR and ER  with MFR techniques to left axillary region Supine AROM flex, scaption, horizontal abduction x 3  09/14/2021 Overhead pulleys 1:30 flexion,  scaption, abduction visual and VC's for proper form Soft tissue mobilization to left UT/pectorals, lats in supine.. Scar mobilization to left mastectomy incision area Supine wand flexion and scaption x 5 ea PROM left shoulder flexion, scaption, IR and ER with MFR techniques to left axillary region.  09/12/2021 Soft tissue mobilization to left UT, pectorals, lats in supine and UT, scapular area, serratus, lats in SL with cocoa butter. Scar mobilization to left mastectomy incision area, and MFR Clasped hands flexion x 5, stargazer x 5, standing wall slides x 5, counter lat stretch x 3 PROM left shoulder flexion, scaption, abduction, IR and ER with MFR techniques to left axillary region.  HOME EXERCISE PROGRAM:  Reviewed previously given post op HEP. Pt to increase to 3x's  per day Instructed in scar massage, set up for ABC class, next SOZO  ASSESSMENT:  CLINICAL IMPRESSION: Pt continues to do well with her ROM activities. 18 degree improvement in AROM for abduction. She will check with [facility] about fitting for her compression sleeve with [employer]   Pt will benefit from skilled therapeutic intervention to improve on the following deficits: Decreased knowledge of precautions, impaired UE functional use, pain, decreased ROM, postural dysfunction.   PT treatment/interventions: ADL/Self care home management, Therapeutic exercises, Therapeutic activity, Neuromuscular re-education, Patient/Family education, Self Care, Joint mobilization, Orthotic/Fit training, Manual lymph drainage, scar mobilization, Manual therapy, and Re-evaluation     GOALS: Goals reviewed with patient? Yes  LONG TERM GOALS:  (STG=LTG)  GOALS Name Target Date  Goal status  1 Pt will demonstrate she has regained full shoulder ROM and function post operatively compared to  baselines or within 5-10 degrees. Baseline: 11/07/2021 IN PROGRESS  2 Pt will improve quick dash to no greater than 12% for improved function 11/07/2021 INITIAL  3 Pt will be able to dress, bathe normal ly and reach into cabinets without difficulty 11/07/2021 INITIAL  4 Pt will attend ABC class and will understand lymphedema precautions 09/21/2021  MET     PLAN: PT FREQUENCY/DURATION: 2x/week x 6 weeks  PLAN FOR NEXT SESSION: Pec stretch,STM to left upper quarter, cording release, PROM, review exercises prn, AROM supine 3 D, scar mobilization, consider compression sleeve, supine scap series Demographics sent to [facility] 09/21/2021  [facility]  [address], [address]  [place] [address]  [phone]  After Breast Cancer Class It is recommended you attend the ABC class to be educated on lymphedema risk reduction. This class is free of charge and lasts for 1 hour. It is a 1-time class. You will need to download the Webex app either on your phone or computer. We will send you a link the night before or the morning of the class. You should be able to click on that link to join the class. This is not a confidential class. You don't have to turn your camera on, but other participants may be able to see your email address.  Scar massage You can begin gentle scar massage to you incision sites. Gently place one hand on the incision and move the skin (without sliding on the skin) in various directions. Do this for a few minutes and then you can gently massage either coconut oil or vitamin E cream into the scars.  Compression garment You should continue wearing your compression bra until you feel like you no longer have swelling.  Home exercise Program Continue doing the exercises you were given until you feel like you can do them without feeling any tightness at the end.   Walking Program Studies show that 30 minutes of walking per day (fast enough to elevate your heart  rate) can significantly reduce the risk of a cancer recurrence. If you can't walk due to other medical reasons, we encourage you to find another activity you could do (like a stationary bike or water exercise).  Posture After breast cancer surgery, people frequently sit with rounded shoulders posture because it puts their incisions on slack and feels better. If you sit like this and scar tissue forms in that position, you can become very tight and have pain sitting or standing with good posture. Try to be aware of your posture and sit and stand up tall to heal properly.  Follow up PT: It  is recommended you return every 3 months for the first 3 years following surgery to be assessed on the SOZO machine for an L-Dex score. This helps prevent clinically significant lymphedema in 95% of patients. These follow up screens are 10 minute appointments that you are not billed for.  Claris Pong, PT 09/26/2021, 8:58 AM

## 2021-09-28 ENCOUNTER — Ambulatory Visit: Payer: BC Managed Care – PPO

## 2021-09-28 DIAGNOSIS — M25612 Stiffness of left shoulder, not elsewhere classified: Secondary | ICD-10-CM

## 2021-09-28 DIAGNOSIS — C50412 Malignant neoplasm of upper-outer quadrant of left female breast: Secondary | ICD-10-CM | POA: Diagnosis not present

## 2021-09-28 DIAGNOSIS — R293 Abnormal posture: Secondary | ICD-10-CM

## 2021-09-28 DIAGNOSIS — M6281 Muscle weakness (generalized): Secondary | ICD-10-CM

## 2021-09-28 DIAGNOSIS — Z483 Aftercare following surgery for neoplasm: Secondary | ICD-10-CM

## 2021-09-28 NOTE — Patient Instructions (Signed)

## 2021-09-28 NOTE — Therapy (Signed)
OUTPATIENT PHYSICAL THERAPY BREAST CANCER POST OP FOLLOW UP   Patient Name: Jasmine Maddox MRN: [id number] DOB:06-27-1960, 61 y.o., female Today's Date: 09/28/2021   PT End of Session - 09/28/21 0803     Visit Number 8    Number of Visits 14    Date for PT Re-Evaluation 10/16/21    PT Start Time 0804    PT Stop Time 0848    PT Time Calculation (min) 44 min    Activity Tolerance Patient tolerated treatment well    Behavior During Therapy WFL for tasks assessed/performed             Past Medical History:  Diagnosis Date   Cervical cancer ([place])    Family history of breast cancer    Hypertension    PONV (postoperative nausea and vomiting)    Past Surgical History:  Procedure Laterality Date   BREAST LUMPECTOMY WITH RADIOACTIVE SEED LOCALIZATION Right 03/20/2018   Procedure: RIGHT BREAST LUMPECTOMY WITH RADIOACTIVE SEED LOCALIZATION;  Surgeon: [name], MD;  Location: [facility];  Service: General;  Laterality: Right;   CHOLECYSTECTOMY     MASTECTOMY W/ SENTINEL NODE BIOPSY Left 07/31/2021   Procedure: LEFT MASTECTOMY WITH LEFT AXILLARY SENTINEL NODE BIOPSY;  Surgeon: Rolm Bookbinder, MD;  Location: [facility];  Service: General;  Laterality: Left;  GEN & PEC BLOCK   PORT-A-CATH REMOVAL Right 07/31/2021   Procedure: PORT REMOVAL;  Surgeon: Rolm Bookbinder, MD;  Location: [facility];  Service: General;  Laterality: Right;   PORTACATH PLACEMENT Right 03/13/2021   Procedure: INSERTION PORT-A-CATH;  Surgeon: Rolm Bookbinder, MD;  Location: [facility];  Service: General;  Laterality: Right;   Patient Active Problem List   Diagnosis Date Noted   S/P mastectomy, left 07/31/2021   Malignant neoplasm of upper-outer quadrant of left breast in female, estrogen receptor positive ([place]) 02/14/2021   Genetic testing 02/25/2018   Family history of breast cancer    Ductal carcinoma in situ (DCIS) of right breast  02/12/2018    PCP: [facility]  REFERRING PROVIDER: Rolm Bookbinder MD  REFERRING DIAG: Left Breast Cancer  THERAPY DIAG:  Malignant neoplasm of upper-outer quadrant of left female breast, unspecified estrogen receptor status ([place])  Stiffness of left shoulder, not elsewhere classified  Abnormal posture  Aftercare following surgery for neoplasm  Muscle weakness (generalized)  Rationale for Evaluation and Treatment Rehabilitation  ONSET DATE: 01/19/2022  SUBJECTIVE:  SUBJECTIVE STATEMENT: I don't restrict myself with anything other than heavy lifting and carrying things on my left shoulder. I called [facility] yesterday and left a Message but haven't heard back yet.   PERTINENT HISTORY:  She has a history in 2020 of Right  ER/PR positive ductal carcinoma in situ that was treated with right lumpectomy, radiotherapy, and she is now on tamoxifen. She does have a significant family history. She had genetic testing in 2020 that was negative. She had a screening mammogram that showed 4 areas on the left. They were biopsied and determined to be Gr. 3 IDC ER positive at 40%, PR negative, HER2 negative, and Ki-67 is 40%. She had neoadjuvant chemo in February and will then have surgery on 07/31/2021 for left mastectomy with deep Axillary SLNB, followed by radiation.   PATIENT GOALS:  Reassess how my recovery is going related to arm function, pain, and swelling.  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   OBJECTIVE:   PATIENT SURVEYS:  QUICK DASH: 36%  OBSERVATIONS:  Incision healing very nicely without significant swelling.  Several cords noted left axillary region.  POSTURE:   Forward head, rounded shoulders,  LYMPHEDEMA ASSESSMENT:     02/19/2021 09/04/2021 09/19/2021 09/26/2021    AROM       Right Shoulder Extension 55 Degrees  55      Right Shoulder Flexion 159 Degrees  158      Right Shoulder ABduction 162 Degrees  162      Right Shoulder Internal Rotation 63 Degrees  62      Right Shoulder External Rotation 105 Degrees  95      Left Shoulder Extension 57 Degrees  55      Left Shoulder Flexion 159 Degrees  120 145 148    Left Shoulder ABduction 165 Degrees  75 130 148    Left Shoulder Internal Rotation 65 Degrees        Left Shoulder External Rotation 95 Degrees                          LYMPHEDEMA/ONCOLOGY QUESTIONNAIRE - 02/19/21 0001       02/19/2021 09/04/2021          Type     Cancer Type Left breast Cancer            Surgeries     Lumpectomy Date  03/20/18   right,  Left mastectomy 07/31/2021    Number Lymph Nodes Removed --   0 in 2020, 0/5 on left 0/5 left          Treatment     Active Chemotherapy Treatment --   pending;to start in Feb. no    Past Chemotherapy Treatment No  yes    Active Radiation Treatment No  no    Past Radiation Treatment Yes  yes    Current Hormone Treatment Yes      Past Hormone Therapy Yes  yes          What other symptoms do you have     Are you Having Heaviness or Tightness No  yes    Are you having Pain No  yes    Are you having pitting edema No  no    Is it Hard or Difficult finding clothes that fit No  no    Do  you have infections No  no    Is there Decreased scar mobility No  yes          Right Upper Extremity Lymphedema 09/04/2021    10 cm Proximal to Olecranon Process 28.5 cm  26.8    Olecranon Process 25 cm  23.8    10 cm Proximal to Ulnar Styloid Process 22.7 cm  21.1    Just Proximal to Ulnar Styloid Process 15.8 cm  14.8    At [facility] of 2nd Digit 6.4 cm  6.3          Left Upper Extremity Lymphedema     10 cm Proximal to Olecranon Process 27.9 cm  26.4    Olecranon Process 24.6 cm  23.8     10 cm Proximal to Ulnar Styloid Process 21.7 cm  20.5    Just Proximal to Ulnar Styloid Process 15.5 cm  14.9    At [facility] of 2nd Digit 6.5 cm  6.5                   Surgery type/Date: 07/31/2021, left mastectomy with deep axillary SLNB, prior right lumpectomy 03/20/18 Number of lymph nodes removed: 0/5 left Current/past treatment (chemo, radiation, hormone therapy): neoadjuvant chemo left Other symptoms:  Heaviness/tightness Yes Pain Yes Pitting edema no Infections No Decreased scar mobility No Stemmer sign No       PATIENT EDUCATION:   Person educated: Patient Education method: Access Code: [email or web address] URL: [email or web address] Date: 09/14/2021 Prepared by: Cheral Almas  Exercises - Supine Shoulder Flexion with Dowel  - 1 x daily - 7 x weekly - 1 sets - 5 reps Education comprehension: verbalized understanding and returned demonstration  TREATMENT TODAY 09/28/2021  Overhead pulleys x 2 min flexion, 1 min scaption, 2 min abduction soft tissue mobilization to left UT/pectorals, lats in supine and Scar mobilization to left mastectomy incision area Supine wand flexion and scaption x 5 ea PROM left shoulder flexion, scaption, IR and ER with MFR techniques to left axillary region Supine scapular series yellow x 5 with demonstration and VC's Supine horizontal abduction x 5 09/26/2021 Discussed compression sleeve and pt will consider getting at [place] so that gauntlet is covered. soft tissue mobilization to left UT/pectorals, lats in supine and Scar mobilization to left mastectomy incision area Supine wand flexion and scaption x 5 ea PROM left shoulder flexion, scaption, IR and ER with MFR techniques to left axillary region Supine AROM flex, scaption, horizontal abduction x 5   09/21/2021 Overhead pulleys 1:30 flexion, scaption, abduction x 1:30, Ball rolls x 10 flexion, x 5 abduction Soft tissue mobilization to left UT/pectorals, lats in supine and UT,  scapular area in SL. Scar mobilization to left mastectomy incision area Supine wand flexion and scaption x 5 ea PROM left shoulder flexion, scaption, IR and ER with MFR techniques to left axillary region Supine AROM flex, scaption, horizontal abduction x 5  09/19/2021 Overhead pulleys 1:30 flexion, scaption, abduction x 1:30 Soft tissue mobilization to left UT/pectorals, lats in supine.. Scar mobilization to left mastectomy incision area Supine wand flexion and scaption x 5 ea PROM left shoulder flexion, scaption, IR and ER with MFR techniques to left axillary region Supine AROM flex, scaption, horizontal abduction x 3  09/14/2021 Overhead pulleys 1:30 flexion,  scaption, abduction visual and VC's for proper form Soft tissue mobilization to left UT/pectorals, lats in supine.. Scar mobilization to left mastectomy incision area Supine wand flexion and scaption x 5 ea PROM left shoulder  flexion, scaption, IR and ER with MFR techniques to left axillary region.  09/12/2021 Soft tissue mobilization to left UT, pectorals, lats in supine and UT, scapular area, serratus, lats in SL with cocoa butter. Scar mobilization to left mastectomy incision area, and MFR Clasped hands flexion x 5, stargazer x 5, standing wall slides x 5, counter lat stretch x 3 PROM left shoulder flexion, scaption, abduction, IR and ER with MFR techniques to left axillary region.  HOME EXERCISE PROGRAM:  Reviewed previously given post op HEP. Pt to increase to 3x's per day Instructed in scar massage, set up for ABC class, next SOZO  ASSESSMENT:  CLINICAL IMPRESSION: Pt continues with good ROM and used good form with supine scapular series exercises. She did require VC's and TC's initially especially for sword exercises. Left UE trembly with band exercises.  Pt will benefit from skilled therapeutic intervention to improve on the following deficits: Decreased knowledge of precautions, impaired UE functional use, pain,  decreased ROM, postural dysfunction.   PT treatment/interventions: ADL/Self care home management, Therapeutic exercises, Therapeutic activity, Neuromuscular re-education, Patient/Family education, Self Care, Joint mobilization, Orthotic/Fit training, Manual lymph drainage, scar mobilization, Manual therapy, and Re-evaluation     GOALS: Goals reviewed with patient? Yes  LONG TERM GOALS:  (STG=LTG)  GOALS Name Target Date  Goal status  1 Pt will demonstrate she has regained full shoulder ROM and function post operatively compared to baselines or within 5-10 degrees. Baseline: 11/09/2021 IN PROGRESS  2 Pt will improve quick dash to no greater than 12% for improved function 11/09/2021 INITIAL  3 Pt will be able to dress, bathe normal ly and reach into cabinets without difficulty 11/09/2021 INITIAL  4 Pt will attend ABC class and will understand lymphedema precautions 09/21/2021  MET     PLAN: PT FREQUENCY/DURATION: 2x/week x 6 weeks  PLAN FOR NEXT SESSION: Pec stretch,STM to left upper quarter, cording release, PROM, review exercises prn, AROM supine 3 D, scar mobilization, consider compression sleeve, review supine scap series Demographics sent to [facility] 09/21/2021  [facility]  [address], [address]  [place] [address]  [phone]  After Breast Cancer Class It is recommended you attend the ABC class to be educated on lymphedema risk reduction. This class is free of charge and lasts for 1 hour. It is a 1-time class. You will need to download the Webex app either on your phone or computer. We will send you a link the night before or the morning of the class. You should be able to click on that link to join the class. This is not a confidential class. You don't have to turn your camera on, but other participants may be able to see your email address.  Scar massage You can begin gentle scar massage to you incision sites. Gently place one hand on the incision  and move the skin (without sliding on the skin) in various directions. Do this for a few minutes and then you can gently massage either coconut oil or vitamin E cream into the scars.  Compression garment You should continue wearing your compression bra until you feel like you no longer have swelling.  Home exercise Program Continue doing the exercises you were given until you feel like you can do them without feeling any tightness at the end.   Walking Program Studies show that 30 minutes of walking per day (fast enough to elevate your heart rate) can significantly reduce the risk of a cancer recurrence. If you  can't walk due to other medical reasons, we encourage you to find another activity you could do (like a stationary bike or water exercise).  Posture After breast cancer surgery, people frequently sit with rounded shoulders posture because it puts their incisions on slack and feels better. If you sit like this and scar tissue forms in that position, you can become very tight and have pain sitting or standing with good posture. Try to be aware of your posture and sit and stand up tall to heal properly.  Follow up PT: It is recommended you return every 3 months for the first 3 years following surgery to be assessed on the SOZO machine for an L-Dex score. This helps prevent clinically significant lymphedema in 95% of patients. These follow up screens are 10 minute appointments that you are not billed for.  Claris Pong, PT 09/28/2021, 8:53 AM

## 2021-10-01 DIAGNOSIS — C50912 Malignant neoplasm of unspecified site of left female breast: Secondary | ICD-10-CM | POA: Diagnosis not present

## 2021-10-01 DIAGNOSIS — C50911 Malignant neoplasm of unspecified site of right female breast: Secondary | ICD-10-CM | POA: Diagnosis not present

## 2021-10-03 ENCOUNTER — Ambulatory Visit: Payer: BC Managed Care – PPO

## 2021-10-03 DIAGNOSIS — M25612 Stiffness of left shoulder, not elsewhere classified: Secondary | ICD-10-CM

## 2021-10-03 DIAGNOSIS — C50412 Malignant neoplasm of upper-outer quadrant of left female breast: Secondary | ICD-10-CM

## 2021-10-03 DIAGNOSIS — Z483 Aftercare following surgery for neoplasm: Secondary | ICD-10-CM

## 2021-10-03 DIAGNOSIS — R293 Abnormal posture: Secondary | ICD-10-CM

## 2021-10-03 DIAGNOSIS — M6281 Muscle weakness (generalized): Secondary | ICD-10-CM

## 2021-10-03 NOTE — Therapy (Signed)
OUTPATIENT PHYSICAL THERAPY BREAST CANCER POST OP FOLLOW UP   Patient Name: Jasmine Maddox MRN: [id number] DOB:Sep 17, 1960, 61 y.o., female Today's Date: 10/03/2021   PT End of Session - 10/03/21 0813     Visit Number 9    Number of Visits 14    Date for PT Re-Evaluation 10/16/21    PT Start Time 0813   pt late   PT Stop Time 0851    PT Time Calculation (min) 38 min    Activity Tolerance Patient tolerated treatment well    Behavior During Therapy [facility] for tasks assessed/performed             Past Medical History:  Diagnosis Date   Cervical cancer ([place])    Family history of breast cancer    Hypertension    PONV (postoperative nausea and vomiting)    Past Surgical History:  Procedure Laterality Date   BREAST LUMPECTOMY WITH RADIOACTIVE SEED LOCALIZATION Right 03/20/2018   Procedure: RIGHT BREAST LUMPECTOMY WITH RADIOACTIVE SEED LOCALIZATION;  Surgeon: [name], MD;  Location: [place];  Service: General;  Laterality: Right;   CHOLECYSTECTOMY     MASTECTOMY W/ SENTINEL NODE BIOPSY Left 07/31/2021   Procedure: LEFT MASTECTOMY WITH LEFT AXILLARY SENTINEL NODE BIOPSY;  Surgeon: Rolm Bookbinder, MD;  Location: [place];  Service: General;  Laterality: Left;  GEN & PEC BLOCK   PORT-A-CATH REMOVAL Right 07/31/2021   Procedure: PORT REMOVAL;  Surgeon: Rolm Bookbinder, MD;  Location: [place];  Service: General;  Laterality: Right;   PORTACATH PLACEMENT Right 03/13/2021   Procedure: INSERTION PORT-A-CATH;  Surgeon: Rolm Bookbinder, MD;  Location: [place];  Service: General;  Laterality: Right;   Patient Active Problem List   Diagnosis Date Noted   S/P mastectomy, left 07/31/2021   Malignant neoplasm of upper-outer quadrant of left breast in female, estrogen receptor positive ([place]) 02/14/2021   Genetic testing 02/25/2018   Family history of breast cancer    Ductal carcinoma in situ (DCIS) of right breast  02/12/2018    PCP: Bluf[facility] Main  REFERRING PROVIDER: Rolm Bookbinder MD  REFERRING DIAG: Left Breast Cancer  THERAPY DIAG:  Malignant neoplasm of upper-outer quadrant of left female breast, unspecified estrogen receptor status ([place])  Stiffness of left shoulder, not elsewhere classified  Abnormal posture  Aftercare following surgery for neoplasm  Muscle weakness (generalized)  Rationale for Evaluation and Treatment Rehabilitation  ONSET DATE: 01/19/2022  SUBJECTIVE:  SUBJECTIVE STATEMENT: I go Friday to a [facility] on Friday. My sleeve is 100% covered.  PERTINENT HISTORY:  She has a history in 2020 of Right  ER/PR positive ductal carcinoma in situ that was treated with right lumpectomy, radiotherapy, and she is now on tamoxifen. She does have a significant family history. She had genetic testing in 2020 that was negative. She had a screening mammogram that showed 4 areas on the left. They were biopsied and determined to be Gr. 3 IDC ER positive at 40%, PR negative, HER2 negative, and Ki-67 is 40%. She had neoadjuvant chemo in February and will then have surgery on 07/31/2021 for left mastectomy with deep Axillary SLNB, followed by radiation.   PATIENT GOALS:  Reassess how my recovery is going related to arm function, pain, and swelling.  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   OBJECTIVE:   PATIENT SURVEYS:  QUICK DASH: 36%  OBSERVATIONS:  Incision healing very nicely without significant swelling.  Several cords noted left axillary region.  POSTURE:  Forward head, rounded shoulders,  LYMPHEDEMA ASSESSMENT:     02/19/2021 09/04/2021 09/19/2021 09/26/2021     AROM       Right Shoulder Extension 55 Degrees  55      Right Shoulder Flexion 159 Degrees  158      Right Shoulder ABduction 162 Degrees  162      Right Shoulder Internal Rotation 63 Degrees  62      Right Shoulder External Rotation 105 Degrees  95      Left Shoulder Extension 57 Degrees  55      Left Shoulder Flexion 159 Degrees  120 145 148    Left Shoulder ABduction 165 Degrees  75 130 148    Left Shoulder Internal Rotation 65 Degrees        Left Shoulder External Rotation 95 Degrees                          LYMPHEDEMA/ONCOLOGY QUESTIONNAIRE - 02/19/21 0001       02/19/2021 09/04/2021          Type     Cancer Type Left breast Cancer            Surgeries     Lumpectomy Date  03/20/18   right,  Left mastectomy 07/31/2021    Number Lymph Nodes Removed --   0 in 2020, 0/5 on left 0/5 left          Treatment     Active Chemotherapy Treatment --   pending;to start in Feb. no    Past Chemotherapy Treatment No  yes    Active Radiation Treatment No  no    Past Radiation Treatment Yes  yes    Current Hormone Treatment Yes      Past Hormone Therapy Yes  yes          What other symptoms do you have     Are you Having Heaviness or Tightness No  yes    Are you having Pain No  yes    Are you having pitting edema No  no    Is it Hard or Difficult finding clothes that fit No  no    Do you have infections No  no    Is there Decreased scar mobility No  yes          Right Upper Extremity Lymphedema 09/04/2021    10 cm Proximal to Olecranon Process 28.5 cm  26.8    Olecranon Process 25 cm  23.8    10 cm Proximal to Ulnar Styloid Process 22.7 cm  21.1    Just Proximal to Ulnar Styloid Process 15.8 cm  14.8    At [facility] of 2nd Digit 6.4 cm  6.3          Left Upper Extremity Lymphedema     10 cm Proximal to Olecranon Process 27.9 cm  26.4    Olecranon Process 24.6 cm  23.8    10 cm Proximal to Ulnar Styloid Process 21.7 cm  20.5    Just Proximal to Ulnar Styloid Process 15.5 cm  14.9     At [facility] of 2nd Digit 6.5 cm  6.5                   Surgery type/Date: 07/31/2021, left mastectomy with deep axillary SLNB, prior right lumpectomy 03/20/18 Number of lymph nodes removed: 0/5 left Current/past treatment (chemo, radiation, hormone therapy): neoadjuvant chemo left Other symptoms:  Heaviness/tightness Yes Pain Yes Pitting edema no Infections No Decreased scar mobility No Stemmer sign No       PATIENT EDUCATION:   Person educated: Patient Education method: Access Code: [email or web address] URL: [email or web address] Date: 09/14/2021 Prepared by: Cheral Almas  Exercises - Supine Shoulder Flexion with Dowel  - 1 x daily - 7 x weekly - 1 sets - 5 reps Education comprehension: verbalized understanding and returned demonstration  TREATMENT TODAY 10/03/2021  Overhead pulleys x 2 min flexion, 1 min scaption, 2 min abduction soft tissue mobilization to left UT/pectorals, lats in supine and Scar mobilization to left mastectomy incision area Supine wand flexion and scaption x 3 ea PROM left shoulder flexion, scaption, IR and ER with MFR techniques to left axillary region Supine scapular series yellow x 5 each  Jobes flexion and scaption x 10 ea after demonstration by PT  09/28/2021  Overhead pulleys x 2 min flexion, 1 min scaption, 2 min abduction soft tissue mobilization to left UT/pectorals, lats in supine and Scar mobilization to left mastectomy incision area Supine wand flexion and scaption x 5 ea PROM left shoulder flexion, scaption, IR and ER with MFR techniques to left axillary region Supine scapular series yellow x 5 with demonstration and VC's Supine horizontal abduction x 5 09/26/2021 Discussed compression sleeve and pt will consider getting at [facility] so that gauntlet is covered. soft tissue mobilization to left UT/pectorals, lats in supine and Scar mobilization to left mastectomy incision area Supine wand flexion and scaption x 5 ea PROM  left shoulder flexion, scaption, IR and ER with MFR techniques to left axillary region Supine AROM flex, scaption, horizontal abduction x 5   09/21/2021 Overhead pulleys 1:30 flexion, scaption, abduction x 1:30, Ball rolls x 10 flexion, x 5 abduction Soft tissue mobilization to left UT/pectorals, lats in supine and UT, scapular area in SL. Scar mobilization to left mastectomy incision area Supine wand flexion and scaption x 5 ea PROM left shoulder flexion, scaption, IR and ER with MFR techniques to left axillary region Supine AROM flex, scaption, horizontal abduction x 5  09/19/2021 Overhead pulleys 1:30 flexion, scaption, abduction x 1:30 Soft tissue mobilization to left UT/pectorals, lats in supine.. Scar mobilization to left mastectomy incision area Supine wand flexion and scaption x 5 ea PROM left shoulder flexion, scaption, IR and  ER with MFR techniques to left axillary region Supine AROM flex, scaption, horizontal abduction x 3  09/14/2021 Overhead pulleys 1:30 flexion,  scaption, abduction visual and VC's for proper form Soft tissue mobilization to left UT/pectorals, lats in supine.. Scar mobilization to left mastectomy incision area Supine wand flexion and scaption x 5 ea PROM left shoulder flexion, scaption, IR and ER with MFR techniques to left axillary region.  09/12/2021 Soft tissue mobilization to left UT, pectorals, lats in supine and UT, scapular area, serratus, lats in SL with cocoa butter. Scar mobilization to left mastectomy incision area, and MFR Clasped hands flexion x 5, stargazer x 5, standing wall slides x 5, counter lat stretch x 3 PROM left shoulder flexion, scaption, abduction, IR and ER with MFR techniques to left axillary region.  HOME EXERCISE PROGRAM:  Reviewed previously given post op HEP. Pt to increase to 3x's per day Instructed in scar massage, set up for ABC class, next SOZO  ASSESSMENT:  CLINICAL IMPRESSION: Lateral incision area is still very  restricted, but not as limiting with ROM. Good form with scapular series exercises and continues with Left arm trembling with exercises. Compliant with HEP   Pt will benefit from skilled therapeutic intervention to improve on the following deficits: Decreased knowledge of precautions, impaired UE functional use, pain, decreased ROM, postural dysfunction.   PT treatment/interventions: ADL/Self care home management, Therapeutic exercises, Therapeutic activity, Neuromuscular re-education, Patient/Family education, Self Care, Joint mobilization, Orthotic/Fit training, Manual lymph drainage, scar mobilization, Manual therapy, and Re-evaluation     GOALS: Goals reviewed with patient? Yes  LONG TERM GOALS:  (STG=LTG)  GOALS Name Target Date  Goal status  1 Pt will demonstrate she has regained full shoulder ROM and function post operatively compared to baselines or within 5-10 degrees. Baseline: 11/14/2021 IN PROGRESS  2 Pt will improve quick dash to no greater than 12% for improved function 11/14/2021 INITIAL  3 Pt will be able to dress, bathe normal ly and reach into cabinets without difficulty 11/14/2021 INITIAL  4 Pt will attend ABC class and will understand lymphedema precautions 09/21/2021  MET     PLAN: PT FREQUENCY/DURATION: 2x/week x 6 weeks  PLAN FOR NEXT SESSION: Pec stretch,STM to left upper quarter, cording release, PROM, review exercises prn, AROM supine 3 D, scar mobilization, consider compression sleeve, review supine scap series Demographics sent to [facility] 09/21/2021  [facility]  [address]  [place] [address]  [phone]  After Breast Cancer Class It is recommended you attend the ABC class to be educated on lymphedema risk reduction. This class is free of charge and lasts for 1 hour. It is a 1-time class. You will need to download the Webex app either on your phone or computer. We will send you a link the night before or the morning  of the class. You should be able to click on that link to join the class. This is not a confidential class. You don't have to turn your camera on, but other participants may be able to see your email address.  Scar massage You can begin gentle scar massage to you incision sites. Gently place one hand on the incision and move the skin (without sliding on the skin) in various directions. Do this for a few minutes and then you can gently massage either coconut oil or vitamin E cream into the scars.  Compression garment You should continue wearing your compression bra until you feel like you no longer have  swelling.  Home exercise Program Continue doing the exercises you were given until you feel like you can do them without feeling any tightness at the end.   Walking Program Studies show that 30 minutes of walking per day (fast enough to elevate your heart rate) can significantly reduce the risk of a cancer recurrence. If you can't walk due to other medical reasons, we encourage you to find another activity you could do (like a stationary bike or water exercise).  Posture After breast cancer surgery, people frequently sit with rounded shoulders posture because it puts their incisions on slack and feels better. If you sit like this and scar tissue forms in that position, you can become very tight and have pain sitting or standing with good posture. Try to be aware of your posture and sit and stand up tall to heal properly.  Follow up PT: It is recommended you return every 3 months for the first 3 years following surgery to be assessed on the SOZO machine for an L-Dex score. This helps prevent clinically significant lymphedema in 95% of patients. These follow up screens are 10 minute appointments that you are not billed for.  Claris Pong, PT 10/03/2021, 8:55 AM

## 2021-10-10 ENCOUNTER — Encounter: Payer: Self-pay | Admitting: Hematology and Oncology

## 2021-10-10 ENCOUNTER — Telehealth: Payer: Self-pay

## 2021-10-10 ENCOUNTER — Ambulatory Visit: Payer: BC Managed Care – PPO

## 2021-10-10 DIAGNOSIS — M6281 Muscle weakness (generalized): Secondary | ICD-10-CM

## 2021-10-10 DIAGNOSIS — C50412 Malignant neoplasm of upper-outer quadrant of left female breast: Secondary | ICD-10-CM

## 2021-10-10 DIAGNOSIS — M25612 Stiffness of left shoulder, not elsewhere classified: Secondary | ICD-10-CM

## 2021-10-10 DIAGNOSIS — Z483 Aftercare following surgery for neoplasm: Secondary | ICD-10-CM

## 2021-10-10 DIAGNOSIS — R293 Abnormal posture: Secondary | ICD-10-CM

## 2021-10-10 NOTE — Telephone Encounter (Signed)
Pt called and states her insurance company is requesting further documents to support approving her MRI 06/28/2021 despite having PA obtained. Per MD, letter written to insurance indicating necessity for MRI post chemo. Pt states the case number is [id number] and reference # [id number]. Letter and all supporting documents faxed to [phone] per pt request.

## 2021-10-10 NOTE — Therapy (Signed)
OUTPATIENT PHYSICAL THERAPY BREAST CANCER POST OP FOLLOW UP   Patient Name: Jasmine Maddox MRN: [id number] DOB:06-29-1960, 61 y.o., female Today's Date: 10/10/2021   PT End of Session - 10/10/21 0758     Visit Number 10    Number of Visits 14    Date for PT Re-Evaluation 10/16/21    PT Start Time 0800    PT Stop Time 0852    PT Time Calculation (min) 52 min    Activity Tolerance Patient tolerated treatment well    Behavior During Therapy WFL for tasks assessed/performed             Past Medical History:  Diagnosis Date   Cervical cancer ([place])    Family history of breast cancer    Hypertension    PONV (postoperative nausea and vomiting)    Past Surgical History:  Procedure Laterality Date   BREAST LUMPECTOMY WITH RADIOACTIVE SEED LOCALIZATION Right 03/20/2018   Procedure: RIGHT BREAST LUMPECTOMY WITH RADIOACTIVE SEED LOCALIZATION;  Surgeon: [name], MD;  Location: [place];  Service: General;  Laterality: Right;   CHOLECYSTECTOMY     MASTECTOMY W/ SENTINEL NODE BIOPSY Left 07/31/2021   Procedure: LEFT MASTECTOMY WITH LEFT AXILLARY SENTINEL NODE BIOPSY;  Surgeon: Rolm Bookbinder, MD;  Location: [place];  Service: General;  Laterality: Left;  GEN & PEC BLOCK   PORT-A-CATH REMOVAL Right 07/31/2021   Procedure: PORT REMOVAL;  Surgeon: Rolm Bookbinder, MD;  Location: [place];  Service: General;  Laterality: Right;   PORTACATH PLACEMENT Right 03/13/2021   Procedure: INSERTION PORT-A-CATH;  Surgeon: Rolm Bookbinder, MD;  Location: [place];  Service: General;  Laterality: Right;   Patient Active Problem List   Diagnosis Date Noted   S/P mastectomy, left 07/31/2021   Malignant neoplasm of upper-outer quadrant of left breast in female, estrogen receptor positive ([place]) 02/14/2021   Genetic testing 02/25/2018   Family history of breast cancer    Ductal carcinoma in situ (DCIS) of right breast  02/12/2018    PCP: Bluford Main  REFERRING PROVIDER: Rolm Bookbinder MD  REFERRING DIAG: Left Breast Cancer  THERAPY DIAG:  Malignant neoplasm of upper-outer quadrant of left female breast, unspecified estrogen receptor status ([place])  Stiffness of left shoulder, not elsewhere classified  Abnormal posture  Aftercare following surgery for neoplasm  Muscle weakness (generalized)  Rationale for Evaluation and Treatment Rehabilitation  ONSET DATE: 01/19/2022  SUBJECTIVE:  SUBJECTIVE STATEMENT: I got my sleeve and gauntlet, and ordered several more too because they are paid for. My shoulder is doing well. Reaching better. Still more limited out to the side.  PERTINENT HISTORY:  She has a history in 2020 of Right  ER/PR positive ductal carcinoma in situ that was treated with right lumpectomy, radiotherapy, and she is now on tamoxifen. She does have a significant family history. She had genetic testing in 2020 that was negative. She had a screening mammogram that showed 4 areas on the left. They were biopsied and determined to be Gr. 3 IDC ER positive at 40%, PR negative, HER2 negative, and Ki-67 is 40%. She had neoadjuvant chemo in February and will then have surgery on 07/31/2021 for left mastectomy with deep Axillary SLNB, followed by radiation.   PATIENT GOALS:  Reassess how my recovery is going related to arm function, pain, and swelling.  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   OBJECTIVE:   PATIENT SURVEYS:  QUICK DASH: 36%  OBSERVATIONS:  Incision healing very nicely without significant swelling.  Several cords noted left axillary region.  POSTURE:  Forward head,  rounded shoulders,  LYMPHEDEMA ASSESSMENT:     02/19/2021 09/04/2021 09/19/2021 09/26/2021    AROM       Right Shoulder Extension 55 Degrees  55      Right Shoulder Flexion 159 Degrees  158      Right Shoulder ABduction 162 Degrees  162      Right Shoulder Internal Rotation 63 Degrees  62      Right Shoulder External Rotation 105 Degrees  95      Left Shoulder Extension 57 Degrees  55      Left Shoulder Flexion 159 Degrees  120 145 148    Left Shoulder ABduction 165 Degrees  75 130 148    Left Shoulder Internal Rotation 65 Degrees        Left Shoulder External Rotation 95 Degrees                          LYMPHEDEMA/ONCOLOGY QUESTIONNAIRE - 02/19/21 0001       02/19/2021 09/04/2021          Type     Cancer Type Left breast Cancer            Surgeries     Lumpectomy Date  03/20/18   right,  Left mastectomy 07/31/2021    Number Lymph Nodes Removed --   0 in 2020, 0/5 on left 0/5 left          Treatment     Active Chemotherapy Treatment --   pending;to start in Feb. no    Past Chemotherapy Treatment No  yes    Active Radiation Treatment No  no    Past Radiation Treatment Yes  yes    Current Hormone Treatment Yes      Past Hormone Therapy Yes  yes          What other symptoms do you have     Are you Having Heaviness or Tightness No  yes    Are you having Pain No  yes    Are you having pitting edema No  no    Is it Hard or Difficult finding clothes that fit No  no    Do you  have infections No  no    Is there Decreased scar mobility No  yes          Right Upper Extremity Lymphedema 09/04/2021    10 cm Proximal to Olecranon Process 28.5 cm  26.8    Olecranon Process 25 cm  23.8    10 cm Proximal to Ulnar Styloid Process 22.7 cm  21.1    Just Proximal to Ulnar Styloid Process 15.8 cm  14.8    At [facility] of 2nd Digit 6.4 cm  6.3          Left Upper Extremity Lymphedema     10 cm Proximal to Olecranon Process 27.9 cm  26.4    Olecranon Process 24.6 cm  23.8    10 cm Proximal to  Ulnar Styloid Process 21.7 cm  20.5    Just Proximal to Ulnar Styloid Process 15.5 cm  14.9    At [place] of 2nd Digit 6.5 cm  6.5                   Surgery type/Date: 07/31/2021, left mastectomy with deep axillary SLNB, prior right lumpectomy 03/20/18 Number of lymph nodes removed: 0/5 left Current/past treatment (chemo, radiation, hormone therapy): neoadjuvant chemo left Other symptoms:  Heaviness/tightness Yes Pain Yes Pitting edema no Infections No Decreased scar mobility No Stemmer sign No       PATIENT EDUCATION:   Person educated: Patient Education method: Access Code: [email or web address] URL: [email or web address] Date: 09/14/2021 Prepared by: Cheral Almas  Exercises - Supine Shoulder Flexion with Dowel  - 1 x daily - 7 x weekly - 1 sets - 5 reps Education comprehension: verbalized understanding and returned demonstration  TREATMENT TODAY  10/10/2021 Overhead pulleys flexion, scaption, abd x 1:30, ball rolls on wall x 10 flex, 5 abduction soft tissue mobilization to left UT/pectorals, lats in supine and Scar mobilization to left mastectomy incision area PROM left shoulder flexion, scaption, IR and ER with MFR techniques to left axillary region,  Supine scapular series yellow x 7 each  Jobes flexion and scaption and abduction x 10  with 1 Lb. Wts ea after demonstration by PT Pectoral stretch at wall;single arm 3 x 10 sec  10/03/2021  Overhead pulleys x 2 min flexion, 1 min scaption, 2 min abduction soft tissue mobilization to left UT/pectorals, lats in supine and Scar mobilization to left mastectomy incision area Supine wand flexion and scaption x 3 ea PROM left shoulder flexion, scaption, IR and ER with MFR techniques to left axillary region Supine scapular series yellow x 5 each  Jobes flexion and scaption x 10 ea after demonstration by PT  HOME EXERCISE PROGRAM:  Reviewed previously given post op HEP. Pt to increase to 3x's per day Instructed in scar  massage, set up for ABC class, next SOZO  ASSESSMENT:   CLINICAL IMPRESSION: Improving with ROM and function but still most limited with abduction. Pt. purchased her compression sleeve and gauntlet. Improving with supine scapular series exercises with less trembling,but requires occasional Vc's for technique.  Pt will benefit from skilled therapeutic intervention to improve on the following deficits: Decreased knowledge of precautions, impaired UE functional use, pain, decreased ROM, postural dysfunction.   PT treatment/interventions: ADL/Self care home management, Therapeutic exercises, Therapeutic activity, Neuromuscular re-education, Patient/Family education, Self Care, Joint mobilization, Orthotic/Fit training, Manual lymph drainage, scar mobilization, Manual therapy, and Re-evaluation     GOALS: Goals reviewed with patient? Yes  LONG TERM GOALS:  (STG=LTG)  GOALS Name Target Date  Goal status  1 Pt will demonstrate she has regained full shoulder ROM and function post operatively compared to baselines or within 5-10 degrees. Baseline: 11/21/2021 IN PROGRESS  2 Pt will improve quick dash to no greater than 12% for improved function 11/21/2021 INITIAL  3 Pt will be able to dress, bathe normal ly and reach into cabinets without difficulty 11/21/2021 INITIAL  4 Pt will attend ABC class and will understand lymphedema precautions 09/21/2021  MET     PLAN: PT FREQUENCY/DURATION: 2x/week x 6 weeks  PLAN FOR NEXT SESSION: Give pics of single arm Pec stretch,Flexion, scaption, abd in standing for HEP,STM to left upper quarter,  PROM, review exercises prn,  scar mobilization, review supine scap series,Add standing Scap retraction, extension with yellow   [facility]  [address], [address]  [place] [address]  [phone]  After Breast Cancer Class It is recommended you attend the ABC class to be educated on lymphedema risk reduction. This class is free of  charge and lasts for 1 hour. It is a 1-time class. You will need to download the Webex app either on your phone or computer. We will send you a link the night before or the morning of the class. You should be able to click on that link to join the class. This is not a confidential class. You don't have to turn your camera on, but other participants may be able to see your email address.  Scar massage You can begin gentle scar massage to you incision sites. Gently place one hand on the incision and move the skin (without sliding on the skin) in various directions. Do this for a few minutes and then you can gently massage either coconut oil or vitamin E cream into the scars.  Compression garment You should continue wearing your compression bra until you feel like you no longer have swelling.  Home exercise Program Continue doing the exercises you were given until you feel like you can do them without feeling any tightness at the end.   Walking Program Studies show that 30 minutes of walking per day (fast enough to elevate your heart rate) can significantly reduce the risk of a cancer recurrence. If you can't walk due to other medical reasons, we encourage you to find another activity you could do (like a stationary bike or water exercise).  Posture After breast cancer surgery, people frequently sit with rounded shoulders posture because it puts their incisions on slack and feels better. If you sit like this and scar tissue forms in that position, you can become very tight and have pain sitting or standing with good posture. Try to be aware of your posture and sit and stand up tall to heal properly.  Follow up PT: It is recommended you return every 3 months for the first 3 years following surgery to be assessed on the SOZO machine for an L-Dex score. This helps prevent clinically significant lymphedema in 95% of patients. These follow up screens are 10 minute appointments that you are not billed  for.  Claris Pong, PT 10/10/2021, 8:54 AM

## 2021-10-11 ENCOUNTER — Telehealth: Payer: Self-pay

## 2021-10-11 NOTE — Telephone Encounter (Signed)
Faxed all requested documentation to provided fax number; fax confirmation received.

## 2021-10-12 ENCOUNTER — Ambulatory Visit: Payer: BC Managed Care – PPO | Admitting: Physical Therapy

## 2021-10-12 ENCOUNTER — Encounter: Payer: Self-pay | Admitting: Physical Therapy

## 2021-10-12 DIAGNOSIS — M25612 Stiffness of left shoulder, not elsewhere classified: Secondary | ICD-10-CM

## 2021-10-12 DIAGNOSIS — C50412 Malignant neoplasm of upper-outer quadrant of left female breast: Secondary | ICD-10-CM

## 2021-10-12 DIAGNOSIS — M6281 Muscle weakness (generalized): Secondary | ICD-10-CM

## 2021-10-12 DIAGNOSIS — Z483 Aftercare following surgery for neoplasm: Secondary | ICD-10-CM

## 2021-10-12 DIAGNOSIS — R293 Abnormal posture: Secondary | ICD-10-CM

## 2021-10-12 NOTE — Therapy (Signed)
OUTPATIENT PHYSICAL THERAPY BREAST CANCER POST OP FOLLOW UP   Patient Name: Jasmine Maddox MRN: [id number] DOB:05/28/1960, 61 y.o., female Today's Date: 10/12/2021   PT End of Session - 10/12/21 0812     Visit Number 11    Number of Visits 14    Date for PT Re-Evaluation 10/16/21    PT Start Time 0802    Activity Tolerance Patient tolerated treatment well    Behavior During Therapy [facility] for tasks assessed/performed             Past Medical History:  Diagnosis Date   Cervical cancer ([place])    Family history of breast cancer    Hypertension    PONV (postoperative nausea and vomiting)    Past Surgical History:  Procedure Laterality Date   BREAST LUMPECTOMY WITH RADIOACTIVE SEED LOCALIZATION Right 03/20/2018   Procedure: RIGHT BREAST LUMPECTOMY WITH RADIOACTIVE SEED LOCALIZATION;  Surgeon: [name], MD;  Location: [place];  Service: General;  Laterality: Right;   CHOLECYSTECTOMY     MASTECTOMY W/ SENTINEL NODE BIOPSY Left 07/31/2021   Procedure: LEFT MASTECTOMY WITH LEFT AXILLARY SENTINEL NODE BIOPSY;  Surgeon: Rolm Bookbinder, MD;  Location: [place];  Service: General;  Laterality: Left;  GEN & PEC BLOCK   PORT-A-CATH REMOVAL Right 07/31/2021   Procedure: PORT REMOVAL;  Surgeon: Rolm Bookbinder, MD;  Location: [place];  Service: General;  Laterality: Right;   PORTACATH PLACEMENT Right 03/13/2021   Procedure: INSERTION PORT-A-CATH;  Surgeon: Rolm Bookbinder, MD;  Location: [place];  Service: General;  Laterality: Right;   Patient Active Problem List   Diagnosis Date Noted   S/P mastectomy, left 07/31/2021   Malignant neoplasm of upper-outer quadrant of left breast in female, estrogen receptor positive ([place]) 02/14/2021   Genetic testing 02/25/2018   Family history of breast cancer    Ductal carcinoma in situ (DCIS) of right breast 02/12/2018    PCP: Bluford Main  REFERRING PROVIDER:  Rolm Bookbinder MD  REFERRING DIAG: Left Breast Cancer  THERAPY DIAG:  Stiffness of left shoulder, not elsewhere classified  Muscle weakness (generalized)  Aftercare following surgery for neoplasm  Abnormal posture  Malignant neoplasm of upper-outer quadrant of left female breast, unspecified estrogen receptor status ([place])  Rationale for Evaluation and Treatment Rehabilitation  ONSET DATE: 01/19/2022  SUBJECTIVE:                                                                                                                                                                                           SUBJECTIVE STATEMENT: I think my shoulder is improving. I will bring  my sleeve next time. I forgot it at home.   PERTINENT HISTORY:  She has a history in 2020 of Right  ER/PR positive ductal carcinoma in situ that was treated with right lumpectomy, radiotherapy, and she is now on tamoxifen. She does have a significant family history. She had genetic testing in 2020 that was negative. She had a screening mammogram that showed 4 areas on the left. They were biopsied and determined to be Gr. 3 IDC ER positive at 40%, PR negative, HER2 negative, and Ki-67 is 40%. She had neoadjuvant chemo in February and will then have surgery on 07/31/2021 for left mastectomy with deep Axillary SLNB, followed by radiation.   PATIENT GOALS:  Reassess how my recovery is going related to arm function, pain, and swelling.  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   OBJECTIVE:   PATIENT SURVEYS:  QUICK DASH: 36%  OBSERVATIONS:  Incision healing very nicely without significant swelling.  Several cords noted left axillary region.  POSTURE:  Forward head, rounded shoulders,  LYMPHEDEMA ASSESSMENT:     02/19/2021 09/04/2021 09/19/2021 09/26/2021    AROM       Right Shoulder Extension 55  Degrees  55      Right Shoulder Flexion 159 Degrees  158      Right Shoulder ABduction 162 Degrees  162      Right Shoulder Internal Rotation 63 Degrees  62      Right Shoulder External Rotation 105 Degrees  95      Left Shoulder Extension 57 Degrees  55      Left Shoulder Flexion 159 Degrees  120 145 148    Left Shoulder ABduction 165 Degrees  75 130 148    Left Shoulder Internal Rotation 65 Degrees        Left Shoulder External Rotation 95 Degrees                          LYMPHEDEMA/ONCOLOGY QUESTIONNAIRE - 02/19/21 0001       02/19/2021 09/04/2021          Type     Cancer Type Left breast Cancer            Surgeries     Lumpectomy Date  03/20/18   right,  Left mastectomy 07/31/2021    Number Lymph Nodes Removed --   0 in 2020, 0/5 on left 0/5 left          Treatment     Active Chemotherapy Treatment --   pending;to start in Feb. no    Past Chemotherapy Treatment No  yes    Active Radiation Treatment No  no    Past Radiation Treatment Yes  yes    Current Hormone Treatment Yes      Past Hormone Therapy Yes  yes          What other symptoms do you have     Are you Having Heaviness or Tightness No  yes    Are you having Pain No  yes    Are you having pitting edema No  no    Is it Hard or Difficult finding clothes that fit No  no    Do you have infections No  no    Is there Decreased scar mobility No  yes  Right Upper Extremity Lymphedema 09/04/2021    10 cm Proximal to Olecranon Process 28.5 cm  26.8    Olecranon Process 25 cm  23.8    10 cm Proximal to Ulnar Styloid Process 22.7 cm  21.1    Just Proximal to Ulnar Styloid Process 15.8 cm  14.8    At [facility] of 2nd Digit 6.4 cm  6.3          Left Upper Extremity Lymphedema     10 cm Proximal to Olecranon Process 27.9 cm  26.4    Olecranon Process 24.6 cm  23.8    10 cm Proximal to Ulnar Styloid Process 21.7 cm  20.5    Just Proximal to Ulnar Styloid Process 15.5 cm  14.9    At Base of 2nd Digit 6.5 cm  6.5                    Surgery type/Date: 07/31/2021, left mastectomy with deep axillary SLNB, prior right lumpectomy 03/20/18 Number of lymph nodes removed: 0/5 left Current/past treatment (chemo, radiation, hormone therapy): neoadjuvant chemo left Other symptoms:  Heaviness/tightness Yes Pain Yes Pitting edema no Infections No Decreased scar mobility No Stemmer sign No       PATIENT EDUCATION:   Person educated: Patient Education method: Access Code: [email or web address] URL: [email or web address] Date: 09/14/2021 Prepared by: Cheral Almas  Exercises - Supine Shoulder Flexion with Dowel  - 1 x daily - 7 x weekly - 1 sets - 5 reps Education comprehension: verbalized understanding and returned demonstration  TREATMENT TODAY 10/12/2021 Overhead pulleys flexion, scaption, abd x 1:30, ball rolls on wall x 10 flex, 10 abduction on L  soft tissue mobilization to left UT/pectorals, lats in supine and Scar mobilization to left mastectomy incision area PROM left shoulder flexion, scaption, abduction and ER with MFR techniques to left axillary region,  Standing shoulder extension with yellow band x 10 reps, standing scapular retraction x 10 reps Added new exercises to pts HEP including: standing AROM flexion, abduction and scaption, scap retraction with yellow band, shoulder extension with yellow band  10/10/2021 Overhead pulleys flexion, scaption, abd x 1:30, ball rolls on wall x 10 flex, 5 abduction soft tissue mobilization to left UT/pectorals, lats in supine and Scar mobilization to left mastectomy incision area PROM left shoulder flexion, scaption, IR and ER with MFR techniques to left axillary region,  Supine scapular series yellow x 7 each  Jobes flexion and scaption and abduction x 10  with 1 Lb. Wts ea after demonstration by PT Pectoral stretch at wall;single arm 3 x 10 sec  10/03/2021  Overhead pulleys x 2 min flexion, 1 min scaption, 2 min abduction soft tissue  mobilization to left UT/pectorals, lats in supine and Scar mobilization to left mastectomy incision area Supine wand flexion and scaption x 3 ea PROM left shoulder flexion, scaption, IR and ER with MFR techniques to left axillary region Supine scapular series yellow x 5 each  Jobes flexion and scaption x 10 ea after demonstration by PT  HOME EXERCISE PROGRAM:  Reviewed previously given post op HEP. Pt to increase to 3x's per day Instructed in scar massage, set up for ABC class, next SOZO Access Code: [email or web address] URL: [email or web address] Date: 10/12/2021 Prepared by: Manus Gunning  Exercises - Single Arm Doorway Pec Stretch at 90 Degrees Abduction (Mirrored)  - 1 x daily - 7 x weekly - 1 sets - 5 reps - 30 sec hold - Standing Shoulder Flexion Full Range  -  1 x daily - 7 x weekly - 1 sets - 10 reps - 5-10 sec hold - Standing Shoulder Abduction Full Range  - 1 x daily - 7 x weekly - 1 sets - 10 reps - 5-10 sec hold - Standing Shoulder Scaption  - 1 x daily - 7 x weekly - 1 sets - 10 reps - 5-10 sec hold - Scapular Retraction with Resistance  - 1 x daily - 7 x weekly - 1 sets - 10 reps - Single Arm Shoulder Extension with Anchored Resistance (Mirrored)  - 1 x daily - 7 x weekly - 1 sets - 10 reps  ASSESSMENT:   CLINICAL IMPRESSION: Pt reports her ROM is doing better. She still has increased tightness in direction of abduction and scaption. Continued with PROM and manual therapy to improve ROM and decrease tightness. Added new exercises to pt's HEP.  Pt will benefit from skilled therapeutic intervention to improve on the following deficits: Decreased knowledge of precautions, impaired UE functional use, pain, decreased ROM, postural dysfunction.   PT treatment/interventions: ADL/Self care home management, Therapeutic exercises, Therapeutic activity, Neuromuscular re-education, Patient/Family education, Self Care, Joint mobilization, Orthotic/Fit training, Manual lymph  drainage, scar mobilization, Manual therapy, and Re-evaluation     GOALS: Goals reviewed with patient? Yes  LONG TERM GOALS:  (STG=LTG)  GOALS Name Target Date  Goal status  1 Pt will demonstrate she has regained full shoulder ROM and function post operatively compared to baselines or within 5-10 degrees. Baseline: 11/23/2021 IN PROGRESS  2 Pt will improve quick dash to no greater than 12% for improved function 11/23/2021 INITIAL  3 Pt will be able to dress, bathe normal ly and reach into cabinets without difficulty 11/23/2021 INITIAL  4 Pt will attend ABC class and will understand lymphedema precautions 09/21/2021  MET     PLAN: PT FREQUENCY/DURATION: 2x/week x 6 weeks  PLAN FOR NEXT SESSION: STM to left upper quarter,  PROM, review exercises prn,  scar mobilization, review supine scap series,how are new exercises?    [facility] [place], PT 10/12/2021, 8:47 AM

## 2021-10-15 DIAGNOSIS — C50912 Malignant neoplasm of unspecified site of left female breast: Secondary | ICD-10-CM | POA: Diagnosis not present

## 2021-10-15 DIAGNOSIS — C50911 Malignant neoplasm of unspecified site of right female breast: Secondary | ICD-10-CM | POA: Diagnosis not present

## 2021-10-17 ENCOUNTER — Ambulatory Visit: Payer: BC Managed Care – PPO

## 2021-10-17 DIAGNOSIS — R293 Abnormal posture: Secondary | ICD-10-CM

## 2021-10-17 DIAGNOSIS — C50412 Malignant neoplasm of upper-outer quadrant of left female breast: Secondary | ICD-10-CM | POA: Diagnosis not present

## 2021-10-17 DIAGNOSIS — M25612 Stiffness of left shoulder, not elsewhere classified: Secondary | ICD-10-CM

## 2021-10-17 DIAGNOSIS — M6281 Muscle weakness (generalized): Secondary | ICD-10-CM

## 2021-10-17 DIAGNOSIS — Z483 Aftercare following surgery for neoplasm: Secondary | ICD-10-CM

## 2021-10-17 NOTE — Therapy (Signed)
OUTPATIENT PHYSICAL THERAPY BREAST CANCER POST OP FOLLOW UP   Patient Name: Jasmine Maddox MRN: [id number] DOB:1960/04/17, 61 y.o., female Today's Date: 10/17/2021   PT End of Session - 10/17/21 0803     Visit Number 12    Number of Visits 14    Date for PT Re-Evaluation 10/16/21    PT Start Time 0804    PT Stop Time 5830    PT Time Calculation (min) 43 min    Activity Tolerance Patient tolerated treatment well    Behavior During Therapy [facility] for tasks assessed/performed             Past Medical History:  Diagnosis Date   Cervical cancer ([place])    Family history of breast cancer    Hypertension    PONV (postoperative nausea and vomiting)    Past Surgical History:  Procedure Laterality Date   BREAST LUMPECTOMY WITH RADIOACTIVE SEED LOCALIZATION Right 03/20/2018   Procedure: RIGHT BREAST LUMPECTOMY WITH RADIOACTIVE SEED LOCALIZATION;  Surgeon: [name], MD;  Location: [place];  Service: General;  Laterality: Right;   CHOLECYSTECTOMY     MASTECTOMY W/ SENTINEL NODE BIOPSY Left 07/31/2021   Procedure: LEFT MASTECTOMY WITH LEFT AXILLARY SENTINEL NODE BIOPSY;  Surgeon: Rolm Bookbinder, MD;  Location: [place];  Service: General;  Laterality: Left;  GEN & PEC BLOCK   PORT-A-CATH REMOVAL Right 07/31/2021   Procedure: PORT REMOVAL;  Surgeon: Rolm Bookbinder, MD;  Location: [place];  Service: General;  Laterality: Right;   PORTACATH PLACEMENT Right 03/13/2021   Procedure: INSERTION PORT-A-CATH;  Surgeon: Rolm Bookbinder, MD;  Location: [place];  Service: General;  Laterality: Right;   Patient Active Problem List   Diagnosis Date Noted   S/P mastectomy, left 07/31/2021   Malignant neoplasm of upper-outer quadrant of left breast in female, estrogen receptor positive ([place]) 02/14/2021   Genetic testing 02/25/2018   Family history of breast cancer    Ductal carcinoma in situ (DCIS) of right breast  02/12/2018    PCP: Bluford Main  REFERRING PROVIDER: Rolm Bookbinder MD  REFERRING DIAG: Left Breast Cancer  THERAPY DIAG:  Stiffness of left shoulder, not elsewhere classified  Muscle weakness (generalized)  Aftercare following surgery for neoplasm  Abnormal posture  Malignant neoplasm of upper-outer quadrant of left female breast, unspecified estrogen receptor status ([place])  Rationale for Evaluation and Treatment Rehabilitation  ONSET DATE: 01/19/2022  SUBJECTIVE:  SUBJECTIVE STATEMENT: I feel like I am ready to be discharged. I brought my sleeve today so you can check it.  PERTINENT HISTORY:  She has a history in 2020 of Right  ER/PR positive ductal carcinoma in situ that was treated with right lumpectomy, radiotherapy, and she is now on tamoxifen. She does have a significant family history. She had genetic testing in 2020 that was negative. She had a screening mammogram that showed 4 areas on the left. They were biopsied and determined to be Gr. 3 IDC ER positive at 40%, PR negative, HER2 negative, and Ki-67 is 40%. She had neoadjuvant chemo in February and will then have surgery on 07/31/2021 for left mastectomy with deep Axillary SLNB, followed by radiation.   PATIENT GOALS:  Reassess how my recovery is going related to arm function, pain, and swelling.  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   OBJECTIVE:   PATIENT SURVEYS:  QUICK DASH: Initial 36%    10/17/2021   9 %  OBSERVATIONS:  Incision healing very nicely without significant swelling.  Several cords noted left axillary region.  POSTURE:  Forward head, rounded shoulders,  LYMPHEDEMA ASSESSMENT:      02/19/2021 09/04/2021 09/19/2021 09/26/2021        10/17/2021    AROM         Right Shoulder Extension 55 Degrees  55        Right Shoulder Flexion 159 Degrees  158        Right Shoulder ABduction 162 Degrees  162        Right Shoulder Internal Rotation 63 Degrees  62        Right Shoulder External Rotation 105 Degrees  95        Left Shoulder Extension 57 Degrees  55    63    Left Shoulder Flexion 159 Degrees  120 145 148  156    Left Shoulder ABduction 165 Degrees  75 130 148  163    Left Shoulder Internal Rotation 65 Degrees      70    Left Shoulder External Rotation 95 Degrees      95                      LYMPHEDEMA/ONCOLOGY QUESTIONNAIRE - 02/19/21 0001       02/19/2021 09/04/2021 10/17/2021           Type      Cancer Type Left breast Cancer              Surgeries      Lumpectomy Date  03/20/18   right,  Left mastectomy 07/31/2021     Number Lymph Nodes Removed --   0 in 2020, 0/5 on left 0/5 left            Treatment      Active Chemotherapy Treatment --   pending;to start in Feb. no     Past Chemotherapy Treatment No  yes     Active Radiation Treatment No  no     Past Radiation Treatment Yes  yes     Current Hormone Treatment Yes       Past Hormone Therapy Yes  yes            What other symptoms do you have      Are you Having Heaviness  or Tightness No  yes     Are you having Pain No  yes     Are you having pitting edema No  no     Is it Hard or Difficult finding clothes that fit No  no     Do you have infections No  no     Is there Decreased scar mobility No  yes            Right Upper Extremity Lymphedema 09/04/2021 10/17/2021    10 cm Proximal to Olecranon Process 28.5 cm  26.8 26.3    Olecranon Process 25 cm  23.8     10 cm Proximal to Ulnar Styloid Process 22.7 cm  21.1 21.1    Just Proximal to Ulnar Styloid Process 15.8 cm  14.8 14.7    At [facility] of 2nd Digit 6.4 cm  6.3 6.5           Left Upper Extremity Lymphedema      10 cm Proximal to Olecranon Process 27.9  cm  26.4 25.7    Olecranon Process 24.6 cm  23.8 23.2    10 cm Proximal to Ulnar Styloid Process 21.7 cm  20.5 20.5    Just Proximal to Ulnar Styloid Process 15.5 cm  14.9 14.1    At Base of 2nd Digit 6.5 cm  6.5 6.3                   Surgery type/Date: 07/31/2021, left mastectomy with deep axillary SLNB, prior right lumpectomy 03/20/18 Number of lymph nodes removed: 0/5 left Current/past treatment (chemo, radiation, hormone therapy): neoadjuvant chemo left Other symptoms:  Heaviness/tightness Yes Pain Yes Pitting edema no Infections No Decreased scar mobility No Stemmer sign No       PATIENT EDUCATION:   Person educated: Patient Education method: Access Code: [email or web address] URL: [email or web address] Date: 09/14/2021 Prepared by: Cheral Almas  Exercises - Supine Shoulder Flexion with Dowel  - 1 x daily - 7 x weekly - 1 sets - 5 reps Education comprehension: verbalized understanding and returned demonstration  TREATMENT TODAY 10/17/2021  Overhead pulleys flexion, scaption, abd x 1:30 soft tissue mobilization to left UT/pectorals, lats in supine and Scar mobilization to left mastectomy incision area PROM left shoulder flexion, scaption, abduction and ER with MFR techniques to left axillary region,  Supine wand flex and scaption x5, snow angels x 10 Assessed ROM and circumference for DC Reviewed proper way to don/doff compression garments and had pt try rubber glove. 10/12/2021 Overhead pulleys flexion, scaption, abd x 1:30, ball rolls on wall x 10 flex, 10 abduction on L  soft tissue mobilization to left UT/pectorals, lats in supine and Scar mobilization to left mastectomy incision area PROM left shoulder flexion, scaption, abduction and ER with MFR techniques to left axillary region,  Standing shoulder extension with yellow band x 10 reps, standing scapular retraction x 10 reps Added new exercises to pts HEP including: standing AROM flexion, abduction and  scaption, scap retraction with yellow band, shoulder extension with yellow band  10/10/2021 Overhead pulleys flexion, scaption, abd x 1:30, ball rolls on wall x 10 flex, 5 abduction soft tissue mobilization to left UT/pectorals, lats in supine and Scar mobilization to left mastectomy incision area PROM left shoulder flexion, scaption, IR and ER with MFR techniques to left axillary region,  Supine scapular series yellow x 7 each  Jobes flexion and scaption and abduction x 10  with 1 Lb. Wts ea after demonstration by PT Pectoral stretch  at wall;single arm 3 x 10 sec  10/03/2021  Overhead pulleys x 2 min flexion, 1 min scaption, 2 min abduction soft tissue mobilization to left UT/pectorals, lats in supine and Scar mobilization to left mastectomy incision area Supine wand flexion and scaption x 3 ea PROM left shoulder flexion, scaption, IR and ER with MFR techniques to left axillary region Supine scapular series yellow x 5 each  Jobes flexion and scaption x 10 ea after demonstration by PT  HOME EXERCISE PROGRAM:  Reviewed previously given post op HEP. Pt to increase to 3x's per day Instructed in scar massage, set up for ABC class, next SOZO Access Code: [email or web address] URL: [email or web address] Date: 10/12/2021 Prepared by: Manus Gunning  Exercises - Single Arm Doorway Pec Stretch at 90 Degrees Abduction (Mirrored)  - 1 x daily - 7 x weekly - 1 sets - 5 reps - 30 sec hold - Standing Shoulder Flexion Full Range  - 1 x daily - 7 x weekly - 1 sets - 10 reps - 5-10 sec hold - Standing Shoulder Abduction Full Range  - 1 x daily - 7 x weekly - 1 sets - 10 reps - 5-10 sec hold - Standing Shoulder Scaption  - 1 x daily - 7 x weekly - 1 sets - 10 reps - 5-10 sec hold - Scapular Retraction with Resistance  - 1 x daily - 7 x weekly - 1 sets - 10 reps - Single Arm Shoulder Extension with Anchored Resistance (Mirrored)  - 1 x daily - 7 x weekly - 1 sets - 10 reps  ASSESSMENT:    CLINICAL IMPRESSION: Pt has achieved all goals established at initial evaluation. ROM has been restored as has function. She achieved quick dash goal and has no restrictions with dressing or bathing. She is compliant with her HEP and has her prophylactic compression sleeve and gauntlet  Pt will benefit from skilled therapeutic intervention to improve on the following deficits: Decreased knowledge of precautions, impaired UE functional use, pain, decreased ROM, postural dysfunction.   PT treatment/interventions: ADL/Self care home management, Therapeutic exercises, Therapeutic activity, Neuromuscular re-education, Patient/Family education, Self Care, Joint mobilization, Orthotic/Fit training, Manual lymph drainage, scar mobilization, Manual therapy, and Re-evaluation     GOALS: Goals reviewed with patient? Yes  LONG TERM GOALS:  (STG=LTG)  GOALS Name Target Date  Goal status  1 Pt will demonstrate she has regained full shoulder ROM and function post operatively compared to baselines or within 5-10 degrees. Baseline: 11/28/2021 MET 10/17/2021  2 Pt will improve quick dash to no greater than 12% for improved function 11/28/2021 MET 10/17/2021  3 Pt will be able to dress, bathe normal ly and reach into cabinets without difficulty 11/28/2021 MET 10/17/2021  4 Pt will attend ABC class and will understand lymphedema precautions 09/21/2021  MET 10/17/2021     PLAN: PT FREQUENCY/DURATION: No further needs identified. Pt is discharged  PLAN FOR NEXT SESSION: DC to HEP  PHYSICAL THERAPY DISCHARGE SUMMARY  Visits from Start of Care: 12  Current functional level related to goals / functional outcomes: Achieved goals established   Remaining deficits: Mild tighness at lateral incision and with end range abduction   Education / Equipment: Theraband, prophylactic compression garments   Patient agrees to discharge. Patient goals were met. Patient is being discharged due to meeting the stated  rehab goals.   Claris Pong, PT 10/17/2021, 8:54 AM

## 2021-10-19 ENCOUNTER — Ambulatory Visit: Payer: BC Managed Care – PPO

## 2021-11-12 ENCOUNTER — Ambulatory Visit: Payer: BC Managed Care – PPO

## 2021-11-14 ENCOUNTER — Encounter: Payer: BC Managed Care – PPO | Admitting: Adult Health

## 2021-11-19 ENCOUNTER — Encounter: Payer: BC Managed Care – PPO | Admitting: Adult Health

## 2021-11-22 ENCOUNTER — Encounter: Payer: Self-pay | Admitting: Rehabilitation

## 2021-11-22 ENCOUNTER — Ambulatory Visit: Payer: BC Managed Care – PPO | Attending: General Surgery | Admitting: Rehabilitation

## 2021-11-22 DIAGNOSIS — C50412 Malignant neoplasm of upper-outer quadrant of left female breast: Secondary | ICD-10-CM | POA: Insufficient documentation

## 2021-11-22 DIAGNOSIS — M6281 Muscle weakness (generalized): Secondary | ICD-10-CM | POA: Insufficient documentation

## 2021-11-22 DIAGNOSIS — R293 Abnormal posture: Secondary | ICD-10-CM | POA: Insufficient documentation

## 2021-11-22 DIAGNOSIS — Z483 Aftercare following surgery for neoplasm: Secondary | ICD-10-CM | POA: Insufficient documentation

## 2021-11-22 DIAGNOSIS — M25612 Stiffness of left shoulder, not elsewhere classified: Secondary | ICD-10-CM | POA: Insufficient documentation

## 2021-11-22 NOTE — Therapy (Signed)
OUTPATIENT PHYSICAL THERAPY SOZO SCREENING NOTE   Patient Name: Jasmine Maddox MRN: [id number] DOB:01/03/1961, 61 y.o., female Today's Date: 11/22/2021  PCP: [name], PA REFERRING PROVIDER: Rolm Bookbinder, MD   PT End of Session - 11/22/21 0803     Visit Number 12    Number of Visits 14    Date for PT Re-Evaluation 10/16/21    PT Start Time 0803    Activity Tolerance Patient tolerated treatment well    Behavior During Therapy [facility] for tasks assessed/performed             Past Medical History:  Diagnosis Date   Cervical cancer ([place])    Family history of breast cancer    Hypertension    PONV (postoperative nausea and vomiting)    Past Surgical History:  Procedure Laterality Date   BREAST LUMPECTOMY WITH RADIOACTIVE SEED LOCALIZATION Right 03/20/2018   Procedure: RIGHT BREAST LUMPECTOMY WITH RADIOACTIVE SEED LOCALIZATION;  Surgeon: [name], MD;  Location: [place];  Service: General;  Laterality: Right;   CHOLECYSTECTOMY     MASTECTOMY W/ SENTINEL NODE BIOPSY Left 07/31/2021   Procedure: LEFT MASTECTOMY WITH LEFT AXILLARY SENTINEL NODE BIOPSY;  Surgeon: Rolm Bookbinder, MD;  Location: [place];  Service: General;  Laterality: Left;  GEN & PEC BLOCK   PORT-A-CATH REMOVAL Right 07/31/2021   Procedure: PORT REMOVAL;  Surgeon: Rolm Bookbinder, MD;  Location: [place];  Service: General;  Laterality: Right;   PORTACATH PLACEMENT Right 03/13/2021   Procedure: INSERTION PORT-A-CATH;  Surgeon: Rolm Bookbinder, MD;  Location: [place];  Service: General;  Laterality: Right;   Patient Active Problem List   Diagnosis Date Noted   S/P mastectomy, left 07/31/2021   Malignant neoplasm of upper-outer quadrant of left breast in female, estrogen receptor positive ([place]) 02/14/2021   Genetic testing 02/25/2018   Family history of breast cancer    Ductal carcinoma in situ (DCIS) of right breast  02/12/2018    REFERRING DIAG: left breast cancer at risk for lymphedema  THERAPY DIAG:  Stiffness of left shoulder, not elsewhere classified  Abnormal posture  Muscle weakness (generalized)  Malignant neoplasm of upper-outer quadrant of left female breast, unspecified estrogen receptor status ([place])  Aftercare following surgery for neoplasm  PERTINENT HISTORY: She has a history in 2020 of Right  ER/PR positive ductal carcinoma in situ that was treated with right lumpectomy, radiotherapy, and she is now on tamoxifen. She does have a significant family history. She had genetic testing in 2020 that was negative. She had a screening mammogram that showed 4 areas on the left. They were biopsied and determined to be Gr. 3 IDC ER positive at 40%, PR negative, HER2 negative, and Ki-67 is 40%. She had neoadjuvant chemo in February and will then have surgery on 07/31/2021 for left mastectomy with deep Axillary SLNB, followed by radiation.   PRECAUTIONS: left UE Lymphedema risk, None  SUBJECTIVE: Every things is going well  PAIN:  Are you having pain? No  SOZO SCREENING: Patient was assessed today using the SOZO machine to determine the lymphedema index score. This was compared to her baseline score. It was determined that she is within the recommended range when compared to her baseline and no further action is needed at this time. She will continue SOZO screenings. These are done every 3 months for 2 years post operatively followed by every 6 months for 2 years, and then annually.   Rosario Jacks, Student-PT 11/22/2021,  8:08 AM

## 2021-11-29 ENCOUNTER — Encounter: Payer: Self-pay | Admitting: Adult Health

## 2021-11-29 ENCOUNTER — Inpatient Hospital Stay: Payer: BC Managed Care – PPO | Attending: Adult Health | Admitting: Adult Health

## 2021-11-29 VITALS — BP 131/80 | HR 68 | Temp 97.7°F | Resp 16 | Ht 63.0 in | Wt 135.1 lb

## 2021-11-29 DIAGNOSIS — M8580 Other specified disorders of bone density and structure, unspecified site: Secondary | ICD-10-CM | POA: Insufficient documentation

## 2021-11-29 DIAGNOSIS — Z79899 Other long term (current) drug therapy: Secondary | ICD-10-CM | POA: Insufficient documentation

## 2021-11-29 DIAGNOSIS — T451X5A Adverse effect of antineoplastic and immunosuppressive drugs, initial encounter: Secondary | ICD-10-CM | POA: Diagnosis not present

## 2021-11-29 DIAGNOSIS — D0511 Intraductal carcinoma in situ of right breast: Secondary | ICD-10-CM | POA: Diagnosis not present

## 2021-11-29 DIAGNOSIS — Z170 Estrogen receptor positive status [ER+]: Secondary | ICD-10-CM | POA: Diagnosis not present

## 2021-11-29 DIAGNOSIS — G620 Drug-induced polyneuropathy: Secondary | ICD-10-CM

## 2021-11-29 DIAGNOSIS — C50412 Malignant neoplasm of upper-outer quadrant of left female breast: Secondary | ICD-10-CM | POA: Diagnosis not present

## 2021-11-29 NOTE — Progress Notes (Signed)
SURVIVORSHIP VISIT:    BRIEF ONCOLOGIC HISTORY:  Oncology History  Ductal carcinoma in situ (DCIS) of right breast  01/30/2018 Mammogram   Diagnostic Mammogram  0.4cm cluster of calcifications, 12 o'clock position, 4cm fn, right breast   02/12/2018 Initial Diagnosis   Screening detected right breast calcifications 4 mm size UOQ 11:30 position stereotactic biopsy revealed high-grade DCIS ER 90%, PR 10%, Tis NX stage 0   02/18/2018 Genetic Testing   Negative.  Genes tested include: ATM, BRCA1, BRCA2, CDH1, CHEK2, PALB2, PTEN, STK11 and TP53; APC, ATM, AXIN2, BARD1, BMPR1A, BRCA1, BRCA2, BRIP1, CDH1, CDKN2A (p14ARF), CDKN2A (p16INK4a), CKD4, CHEK2, CTNNA1, DICER1, EPCAM (Deletion/duplication testing only), GREM1 (promoter region deletion/duplication testing only), KIT, MEN1, MLH1, MSH2, MSH3, MSH6, MUTYH, NBN, NF1, NHTL1, PALB2, PDGFRA, PMS2, POLD1, POLE, PTEN, RAD50, RAD51C, RAD51D, SDHB, SDHC, SDHD, SMAD4, SMARCA4. STK11, TP53, TSC1, TSC2, and VHL.  The following genes were evaluated for sequence changes only: SDHA and HOXB13 c.251G>A variant only.   03/20/2018 Surgery   Right lumpectomy: Scattered microscopic foci of DCIS intermediate grade, margins negative, ER 90%, PR 10%, Tis NX stage 0   04/01/2018 Cancer Staging   Staging form: Breast, AJCC 8th Edition - Pathologic: Stage 0 (pTis (DCIS), pN0, cM0, ER+, PR+) - Signed by Gardenia Phlegm, NP on 04/01/2018   04/28/2018 - 05/26/2018 Radiation Therapy   Adjuvant radiation 1. Right breast; 15 fractions of 2.67 Gy for a total of 40.05 Gy 2. Boost; 5 fractions of 2 Gy for a total of 10 Gy     05/2018 -  Anti-estrogen oral therapy   Tamoxifen daily   Malignant neoplasm of upper-outer quadrant of left breast in female, estrogen receptor positive ([place])  01/19/2021 Initial Diagnosis   Left breast biopsy: 2:00 and 4:00: Grade 3 IDC ER 40% weak, PR 0%, Ki-67 40%, HER2 2+ equivocal by IHC, FISH negative Lymph node biopsy: Benign   01/19/2021  Cancer Staging   Staging form: Breast, AJCC 8th Edition - Clinical stage from 01/19/2021: Stage IIIA (cT2, cN1, cM0, G3, ER+, PR-, HER2-) - Signed by Gardenia Phlegm, NP on 05/09/2021 Stage prefix: Initial diagnosis Histologic grading system: 3 grade system   02/12/2021 Breast MRI   Breast MRI 02/12/2021: 4 suspicious masses within the left breast consistent with multifocal, multicentric disease 2 of the 4 masses have been biopsied (2 cm, 1.3 cm, 1.2 cm, 1.7 cm) for mildly prominent level 1 lymph nodes are identified   03/14/2021 - 06/14/2021 Chemotherapy   Patient is on Treatment Plan : BREAST ADJUVANT DOSE DENSE AC q14d / PACLitaxel q7d       INTERVAL HISTORY:  Ms. Kliebert to review her survivorship care plan detailing her treatment course for breast cancer, as well as monitoring long-term side effects of that treatment, education regarding health maintenance, screening, and overall wellness and health promotion.     Overall, Ms. Haas reports feeling quite well.  She is taking letrozole dailya nd toelrates it well with the exception of vaginal dryness. She is using a lubricant, and it is not impacting her secual activity.  REVIEW OF SYSTEMS:  Review of Systems  Constitutional:  Negative for appetite change, chills, fatigue, fever and unexpected weight change.  HENT:   Negative for hearing loss, lump/mass and trouble swallowing.   Eyes:  Negative for eye problems and icterus.  Respiratory:  Negative for chest tightness, cough and shortness of breath.   Cardiovascular:  Negative for chest pain, leg swelling and palpitations.  Gastrointestinal:  Negative for abdominal distention,  abdominal pain, constipation, diarrhea, nausea and vomiting.  Endocrine: Negative for hot flashes.  Genitourinary:  Negative for difficulty urinating.   Musculoskeletal:  Negative for arthralgias.  Skin:  Negative for itching and rash.  Neurological:  Negative for dizziness, extremity weakness,  headaches and numbness.  Hematological:  Negative for adenopathy. Does not bruise/bleed easily.  Psychiatric/Behavioral:  Negative for depression. The patient is not nervous/anxious.   Breast: Denies any new nodularity, masses, tenderness, nipple changes, or nipple discharge.    ONCOLOGY TREATMENT TEAM:  1. Surgeon:  Dr. Donne Hazel at [facility] 2. Medical Oncologist: Dr. Lindi Adie    PAST MEDICAL/SURGICAL HISTORY:  Past Medical History:  Diagnosis Date   Cervical cancer [facility])    Family history of breast cancer    Hypertension    PONV (postoperative nausea and vomiting)    Past Surgical History:  Procedure Laterality Date   BREAST LUMPECTOMY WITH RADIOACTIVE SEED LOCALIZATION Right 03/20/2018   Procedure: RIGHT BREAST LUMPECTOMY WITH RADIOACTIVE SEED LOCALIZATION;  Surgeon: [name], MD;  Location: [place];  Service: General;  Laterality: Right;   CHOLECYSTECTOMY     MASTECTOMY W/ SENTINEL NODE BIOPSY Left 07/31/2021   Procedure: LEFT MASTECTOMY WITH LEFT AXILLARY SENTINEL NODE BIOPSY;  Surgeon: Rolm Bookbinder, MD;  Location: [place];  Service: General;  Laterality: Left;  GEN & PEC BLOCK   PORT-A-CATH REMOVAL Right 07/31/2021   Procedure: PORT REMOVAL;  Surgeon: Rolm Bookbinder, MD;  Location: [place];  Service: General;  Laterality: Right;   PORTACATH PLACEMENT Right 03/13/2021   Procedure: INSERTION PORT-A-CATH;  Surgeon: Rolm Bookbinder, MD;  Location: [place];  Service: General;  Laterality: Right;     ALLERGIES:  No Known Allergies   CURRENT MEDICATIONS:  Outpatient Encounter Medications as of 11/29/2021  Medication Sig   ALPRAZolam (XANAX) 0.25 MG tablet Take 1 tablet (0.25 mg total) by mouth 2 (two) times daily as needed for anxiety.   chlorhexidine (PERIDEX) 0.12 % solution 15 mLs 2 (two) times daily.   letrozole (FEMARA) 2.5 MG tablet Take 1 tablet (2.5 mg total) by mouth  daily.   lidocaine-prilocaine (EMLA) cream Apply to affected area once   lisinopril-hydrochlorothiazide (ZESTORETIC) 20-12.5 MG tablet Take 1 tablet by mouth daily.   methocarbamol (ROBAXIN) 750 MG tablet Take 1 tablet (750 mg total) by mouth every 8 (eight) hours as needed (use for muscle cramps/pain).   mupirocin ointment (BACTROBAN) 2 % Apply 1 application. topically daily.   ondansetron (ZOFRAN) 8 MG tablet Take 1 tablet (8 mg total) by mouth 2 (two) times daily as needed. Start on the third day after chemotherapy.   prochlorperazine (COMPAZINE) 10 MG tablet Take 1 tablet (10 mg total) by mouth every 6 (six) hours as needed (Nausea or vomiting).   tamoxifen (NOLVADEX) 20 MG tablet Take 20 mg by mouth daily.   traMADol (ULTRAM) 50 MG tablet Take 1 tablet (50 mg total) by mouth every 6 (six) hours as needed.   No facility-administered encounter medications on file as of 11/29/2021.     ONCOLOGIC FAMILY HISTORY:  Family History  Problem Relation Age of Onset   Breast cancer Mother 54       d. 76   Breast cancer Sister 81       triple negative, "negative genetic testing 2019"     GENETIC COUNSELING/TESTING: negative  SOCIAL HISTORY:  Social History   Socioeconomic History   Marital status: Married    Spouse name: Not  on file   Number of children: Not on file   Years of education: Not on file   Highest education level: Not on file  Occupational History   Not on file  Tobacco Use   Smoking status: Never   Smokeless tobacco: Never  Vaping Use   Vaping Use: Never used  Substance and Sexual Activity   Alcohol use: Yes    Comment: socially   Drug use: Never   Sexual activity: Not on file  Other Topics Concern   Not on file  Social History Narrative   Not on file   Social Determinants of Health   Financial Resource Strain: Not on file  Food Insecurity: Not on file  Transportation Needs: Not on file  Physical Activity: Not on file  Stress: Not on file  Social  Connections: Not on file  Intimate Partner Violence: Not on file     OBSERVATIONS/OBJECTIVE:  BP 131/80 (BP Location: Left Arm, Patient Position: Sitting)   Pulse 68   Temp 97.7 F (36.5 C) (Temporal)   Resp 16   Ht _0  (1.6 m)   Wt 135 lb 1.6 oz (61.3 kg)   SpO2 100%   BMI 23.93 kg/m  GENERAL: Patient is a well appearing female in no acute distress HEENT:  Sclerae anicteric.  Oropharynx clear and moist. No ulcerations or evidence of oropharyngeal candidiasis. Neck is supple.  NODES:  No cervical, supraclavicular, or axillary lymphadenopathy palpated.  BREAST EXAM:  right breast s/p lumpectomy and radiation, no sign of local recurrence, left breast s/p mastectomy, no sign of local recurrence LUNGS:  Clear to auscultation bilaterally.  No wheezes or rhonchi. HEART:  Regular rate and rhythm. No murmur appreciated. ABDOMEN:  Soft, nontender.  Positive, normoactive bowel sounds. No organomegaly palpated. MSK:  No focal spinal tenderness to palpation. Full range of motion bilaterally in the upper extremities. EXTREMITIES:  No peripheral edema.   SKIN:  Clear with no obvious rashes or skin changes. No nail dyscrasia. NEURO:  Nonfocal. Well oriented.  Appropriate affect.   LABORATORY DATA:  None for this visit.  DIAGNOSTIC IMAGING:  None for this visit.      ASSESSMENT AND PLAN:  Ms.. Maddox is a pleasant 61 y.o. 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  She presents to the Survivorship Clinic for our initial meeting and routine follow-up post-completion of treatment for breast cancer.    1. Stage IIIA left breast cancer:  Jasmine Maddox is continuing to recover from definitive treatment for breast cancer. She will follow-up with her medical oncologist, Dr. Lindi Adie in 6 months with history and physical exam per  surveillance protocol.  She will continue her anti-estrogen therapy with Letrozole. Thus far, she is tolerating the Letrozole well--see #2.   We will continue to undergo annual right breast mammograms.  Today, a comprehensive survivorship care plan and treatment summary was reviewed with the patient today detailing her breast cancer diagnosis, treatment course, potential late/long-term effects of treatment, appropriate follow-up care with recommendations for the future, and patient education resources.  A copy of this summary, along with a letter will be sent to the patient's primary care provider via mail/fax/In Basket message after today's visit.    2 vaginal dryness: We gave her a handout on vaginal dryness, nonpharmacologic interventions such as vitamin E suppositories or coconut oil as a lubricant which may help.  We also  gave her a pamphlet for pelvic rehab should she need to explore that as an option.  She still is having intercourse so I do not think vaginal atrophy is going to be an issue for her.  3.  Chemotherapy-induced peripheral neuropathy: This is improved from when she stopped her chemotherapy early.  She still gets this from time to time and I did recommend that she continue to use the stress ball and take B12 vitamins.  4. Bone health:  Given Ms. Riedl's age/history of breast cancer and her current treatment regimen including anti-estrogen therapy with Letrozole, she is at risk for bone demineralization.  Her last DEXA scan was 07/16/2021, which showed osteopenia.  She will repeat in 2 years.  She was given education on specific activities to promote bone health.  5. Cancer screening:  Due to Ms. Harcum's history and her age, she should receive screening for skin cancers, colon cancer, and gynecologic cancers.  The information and recommendations are listed on the patient's comprehensive care plan/treatment summary and were reviewed in detail with the patient.    6. Health maintenance  and wellness promotion: Ms. Soderberg was encouraged to consume 5-7 servings of fruits and vegetables per day. We reviewed the "Nutrition Rainbow" handout.  She was also encouraged to engage in moderate to vigorous exercise for 30 minutes per day most days of the week. We discussed the [employer] fitness program, which is designed for cancer survivors to help them become more physically fit after cancer treatments.  She was instructed to limit her alcohol consumption and continue to abstain from tobacco use\.     7. Support services/counseling: It is not uncommon for this period of the patient's cancer care trajectory to be one of many emotions and stressors. She was given information regarding our available services and encouraged to contact me with any questions or for help enrolling in any of our support group/programs.    Follow up instructions:    -Return to cancer center in 6 months for f/u with Dr. Lindi Adie  -Mammogram due in 12/2021 -She is welcome to return back to the Survivorship Clinic at any time; no additional follow-up needed at this time.  -Consider referral back to survivorship as a long-term survivor for continued surveillance  The patient was provided an opportunity to ask questions and all were answered. The patient agreed with the plan and demonstrated an understanding of the instructions.   Total encounter time:40 minutes*in face-to-face visit time, chart review, lab review, care coordination, order entry, and documentation of the encounter time.   Wilber Bihari, NP 11/29/21 8:47 AM Medical Oncology and Hematology [facility] [place], [place] [address] Tel. [phone]    Fax. [phone]  *Total Encounter Time as defined by the Centers for Medicare and Medicaid Services includes, in addition to the face-to-face time of a patient visit (documented in the note above) non-face-to-face time: obtaining and reviewing outside history, ordering  and reviewing medications, tests or procedures, care coordination (communications with other health care professionals or caregivers) and documentation in the medical record.

## 2021-11-30 ENCOUNTER — Encounter: Payer: Self-pay | Admitting: Hematology and Oncology

## 2021-11-30 ENCOUNTER — Telehealth: Payer: Self-pay | Admitting: Adult Health

## 2021-11-30 NOTE — Telephone Encounter (Signed)
Scheduled appointment per 11/9 los. Patient is aware.

## 2022-01-03 DIAGNOSIS — I10 Essential (primary) hypertension: Secondary | ICD-10-CM | POA: Diagnosis not present

## 2022-01-03 DIAGNOSIS — J309 Allergic rhinitis, unspecified: Secondary | ICD-10-CM | POA: Diagnosis not present

## 2022-01-03 DIAGNOSIS — M810 Age-related osteoporosis without current pathological fracture: Secondary | ICD-10-CM | POA: Diagnosis not present

## 2022-01-22 DIAGNOSIS — Z1231 Encounter for screening mammogram for malignant neoplasm of breast: Secondary | ICD-10-CM | POA: Diagnosis not present

## 2022-01-24 ENCOUNTER — Encounter: Payer: Self-pay | Admitting: Adult Health

## 2022-03-04 ENCOUNTER — Ambulatory Visit: Payer: BC Managed Care – PPO | Attending: General Surgery

## 2022-03-04 VITALS — Wt 133.2 lb

## 2022-03-04 DIAGNOSIS — Z483 Aftercare following surgery for neoplasm: Secondary | ICD-10-CM | POA: Insufficient documentation

## 2022-03-04 NOTE — Therapy (Signed)
OUTPATIENT PHYSICAL THERAPY SOZO SCREENING NOTE   Patient Name: Jasmine Maddox MRN: [id number] DOB:1960/11/20, 62 y.o., female Today's Date: 03/04/2022  PCP: [name], PA REFERRING PROVIDER: Rolm Bookbinder, MD   PT End of Session - 03/04/22 [phone]     Visit Number 12   # unchanged due to scren only   PT Start Time 0924    PT Stop Time 0928    PT Time Calculation (min) 4 min    Activity Tolerance Patient tolerated treatment well    Behavior During Therapy [facility] for tasks assessed/performed             Past Medical History:  Diagnosis Date   Cervical cancer ([place])    Family history of breast cancer    Hypertension    PONV (postoperative nausea and vomiting)    Past Surgical History:  Procedure Laterality Date   BREAST LUMPECTOMY WITH RADIOACTIVE SEED LOCALIZATION Right 03/20/2018   Procedure: RIGHT BREAST LUMPECTOMY WITH RADIOACTIVE SEED LOCALIZATION;  Surgeon: [name], MD;  Location: [place];  Service: General;  Laterality: Right;   CHOLECYSTECTOMY     MASTECTOMY W/ SENTINEL NODE BIOPSY Left 07/31/2021   Procedure: LEFT MASTECTOMY WITH LEFT AXILLARY SENTINEL NODE BIOPSY;  Surgeon: Rolm Bookbinder, MD;  Location: [place];  Service: General;  Laterality: Left;  GEN & PEC BLOCK   PORT-A-CATH REMOVAL Right 07/31/2021   Procedure: PORT REMOVAL;  Surgeon: Rolm Bookbinder, MD;  Location: [place];  Service: General;  Laterality: Right;   PORTACATH PLACEMENT Right 03/13/2021   Procedure: INSERTION PORT-A-CATH;  Surgeon: Rolm Bookbinder, MD;  Location: [place];  Service: General;  Laterality: Right;   Patient Active Problem List   Diagnosis Date Noted   Chemotherapy-induced peripheral neuropathy ([place]) 11/29/2021   S/P mastectomy, left 07/31/2021   Malignant neoplasm of upper-outer quadrant of left breast in female, estrogen receptor positive ([place]) 02/14/2021   Genetic testing 02/25/2018    Family history of breast cancer    Ductal carcinoma in situ (DCIS) of right breast 02/12/2018   Esophageal reflux 05/03/2009   Benign essential hypertension 04/25/2009    REFERRING DIAG: left breast cancer at risk for lymphedema  THERAPY DIAG:  Aftercare following surgery for neoplasm  PERTINENT HISTORY: She has a history in 2020 of Right  ER/PR positive ductal carcinoma in situ that was treated with right lumpectomy, radiotherapy, and she is now on tamoxifen. She does have a significant family history. She had genetic testing in 2020 that was negative. She had a screening mammogram that showed 4 areas on the left. They were biopsied and determined to be Gr. 3 IDC ER positive at 40%, PR negative, HER2 negative, and Ki-67 is 40%. She had neoadjuvant chemo in February and will then have surgery on 07/31/2021 for left mastectomy with deep Axillary SLNB, followed by radiation.   PRECAUTIONS: left UE Lymphedema risk, None  SUBJECTIVE: Every things is going well  PAIN:  Are you having pain? No  SOZO SCREENING: Patient was assessed today using the SOZO machine to determine the lymphedema index score. This was compared to her baseline score. It was determined that she is within the recommended range when compared to her baseline and no further action is needed at this time. She will continue SOZO screenings. These are done every 3 months for 2 years post operatively followed by every 6 months for 2 years, and then annually.   L-DEX FLOWSHEETS - 03/04/22 0900  L-DEX LYMPHEDEMA SCREENING   Measurement Type Unilateral    L-DEX MEASUREMENT EXTREMITY Upper Extremity    POSITION  Standing    DOMINANT SIDE Right    At Risk Side Left    BASELINE SCORE (UNILATERAL) 1.5    L-DEX SCORE (UNILATERAL) -0.1    VALUE CHANGE (UNILAT) -1.6              Otelia Limes, PTA 03/04/2022, 9:29 AM

## 2022-05-25 NOTE — Progress Notes (Signed)
Patient Care Team: Wilfrid Lund, PA as PCP - General (Family Medicine) Serena Croissant, MD as Consulting Physician (Hematology and Oncology) Emelia Loron, MD as Consulting Physician (General Surgery)  DIAGNOSIS: No diagnosis found.  SUMMARY OF ONCOLOGIC HISTORY: Oncology History  Ductal carcinoma in situ (DCIS) of right breast  01/30/2018 Mammogram   Diagnostic Mammogram  0.4cm cluster of calcifications, 12 o'clock position, 4cm fn, right breast   02/12/2018 Initial Diagnosis   Screening detected right breast calcifications 4 mm size UOQ 11:30 position stereotactic biopsy revealed high-grade DCIS ER 90%, PR 10%, Tis NX stage 0   02/18/2018 Genetic Testing   Negative.  Genes tested include: ATM, BRCA1, BRCA2, CDH1, CHEK2, PALB2, PTEN, STK11 and TP53; APC, ATM, AXIN2, BARD1, BMPR1A, BRCA1, BRCA2, BRIP1, CDH1, CDKN2A (p14ARF), CDKN2A (p16INK4a), CKD4, CHEK2, CTNNA1, DICER1, EPCAM (Deletion/duplication testing only), GREM1 (promoter region deletion/duplication testing only), KIT, MEN1, MLH1, MSH2, MSH3, MSH6, MUTYH, NBN, NF1, NHTL1, PALB2, PDGFRA, PMS2, POLD1, POLE, PTEN, RAD50, RAD51C, RAD51D, SDHB, SDHC, SDHD, SMAD4, SMARCA4. STK11, TP53, TSC1, TSC2, and VHL.  The following genes were evaluated for sequence changes only: SDHA and HOXB13 c.251G>A variant only.   03/20/2018 Surgery   Right lumpectomy: Scattered microscopic foci of DCIS intermediate grade, margins negative, ER 90%, PR 10%, Tis NX stage 0   04/01/2018 Cancer Staging   Staging form: Breast, AJCC 8th Edition - Pathologic: Stage 0 (pTis (DCIS), pN0, cM0, ER+, PR+) - Signed by Loa Socks, NP on 04/01/2018   04/28/2018 - 05/26/2018 Radiation Therapy   Adjuvant radiation 1. Right breast; 15 fractions of 2.67 Gy for a total of 40.05 Gy 2. Boost; 5 fractions of 2 Gy for a total of 10 Gy     05/2018 -  Anti-estrogen oral therapy   Tamoxifen daily   Malignant neoplasm of upper-outer quadrant of left breast in female,  estrogen receptor positive ([facility])  01/19/2021 Initial Diagnosis   Left breast biopsy: 2:00 and 4:00: Grade 3 IDC ER 40% weak, PR 0%, Ki-67 40%, HER2 2+ equivocal by IHC, FISH negative Lymph node biopsy: Benign   01/19/2021 Cancer Staging   Staging form: Breast, AJCC 8th Edition - Clinical stage from 01/19/2021: Stage IIIA (cT2, cN1, cM0, G3, ER+, PR-, HER2-) - Signed by Loa Socks, NP on 05/09/2021 Stage prefix: Initial diagnosis Histologic grading system: 3 grade system   02/12/2021 Breast MRI   Breast MRI 02/12/2021: 4 suspicious masses within the left breast consistent with multifocal, multicentric disease 2 of the 4 masses have been biopsied (2 cm, 1.3 cm, 1.2 cm, 1.7 cm) for mildly prominent level 1 lymph nodes are identified   03/14/2021 - 06/14/2021 Chemotherapy   Patient is on Treatment Plan : BREAST ADJUVANT DOSE DENSE AC q14d / PACLitaxel q7d     07/31/2021 Surgery   2. mastectomy with sentinel lymph node biopsy: 07/31/2021: Residual grade 2 IDC 4.1 mm, margins negative, 0/5 lymph nodes negative, ER 40% weak staining, PR negative, HER2 negative, Ki-67 40%    08/2021 -  Anti-estrogen oral therapy   Letrozole     CHIEF COMPLIANT: Follow-up left breast cancer    INTERVAL HISTORY: Jasmine Maddox is a 62 y.o. with above-mentioned history of left breast cancer. She presents to the clinic today for a follow-up.    ALLERGIES:  has No Known Allergies.  MEDICATIONS:  Current Outpatient Medications  Medication Sig Dispense Refill   ALPRAZolam (XANAX) 0.25 MG tablet Take 1 tablet (0.25 mg total) by mouth 2 (two) times daily as needed  for anxiety. 30 tablet 1   chlorhexidine (PERIDEX) 0.12 % solution 15 mLs 2 (two) times daily.     letrozole (FEMARA) 2.5 MG tablet Take 1 tablet (2.5 mg total) by mouth daily. 90 tablet 3   lidocaine-prilocaine (EMLA) cream Apply to affected area once 30 g 3   lisinopril-hydrochlorothiazide (ZESTORETIC) 20-12.5 MG tablet Take 1 tablet by mouth  daily.     methocarbamol (ROBAXIN) 750 MG tablet Take 1 tablet (750 mg total) by mouth every 8 (eight) hours as needed (use for muscle cramps/pain). 12 tablet 0   mupirocin ointment (BACTROBAN) 2 % Apply 1 application. topically daily. 22 g 0   ondansetron (ZOFRAN) 8 MG tablet Take 1 tablet (8 mg total) by mouth 2 (two) times daily as needed. Start on the third day after chemotherapy. 30 tablet 1   prochlorperazine (COMPAZINE) 10 MG tablet Take 1 tablet (10 mg total) by mouth every 6 (six) hours as needed (Nausea or vomiting). 30 tablet 1   traMADol (ULTRAM) 50 MG tablet Take 1 tablet (50 mg total) by mouth every 6 (six) hours as needed. 10 tablet 0   No current facility-administered medications for this visit.    PHYSICAL EXAMINATION: ECOG PERFORMANCE STATUS: {CHL ONC ECOG [id number]}  There were no vitals filed for this visit. There were no vitals filed for this visit.  BREAST:*** No palpable masses or nodules in either right or left breasts. No palpable axillary supraclavicular or infraclavicular adenopathy no breast tenderness or nipple discharge. (exam performed in the presence of a chaperone)  LABORATORY DATA:  I have reviewed the data as listed    Latest Ref Rng & Units 07/20/2021    8:18 AM 06/21/2021   11:07 AM 06/14/2021   11:45 AM  CMP  Glucose 70 - 99 mg/dL 98  604  540   BUN 6 - 20 mg/dL 12  9  10    Creatinine 0.44 - 1.00 mg/dL 9.81  1.91  4.78   Sodium 135 - 145 mmol/L 140  137  137   Potassium 3.5 - 5.1 mmol/L 3.9  3.5  3.7   Chloride 98 - 111 mmol/L 105  103  103   CO2 22 - 32 mmol/L 27  29  28    Calcium 8.9 - 10.3 mg/dL 9.5  9.4  8.8   Total Protein 6.5 - 8.1 g/dL  6.8  7.0   Total Bilirubin 0.3 - 1.2 mg/dL  0.4  0.4   Alkaline Phos 38 - 126 U/L  48  53   AST 15 - 41 U/L  18  29   ALT 0 - 44 U/L  20  25     Lab Results  Component Value Date   WBC 3.5 (L) 06/21/2021   HGB 9.0 (L) 06/21/2021   HCT 27.1 (L) 06/21/2021   MCV 93.8 06/21/2021   PLT 264  06/21/2021   NEUTROABS 2.5 06/21/2021    ASSESSMENT & PLAN:  No problem-specific Assessment & Plan notes found for this encounter.    No orders of the defined types were placed in this encounter.  The patient has a good understanding of the overall plan. she agrees with it. she will call with any problems that may develop before the next visit here. Total time spent: 30 mins including face to face time and time spent for planning, charting and co-ordination of care   Sherlyn Lick, CMA 05/25/22    I Janan Ridge am acting as a Neurosurgeon for [employer]  ***

## 2022-05-30 ENCOUNTER — Other Ambulatory Visit: Payer: Self-pay

## 2022-05-30 ENCOUNTER — Inpatient Hospital Stay: Payer: BC Managed Care – PPO | Attending: Hematology and Oncology | Admitting: Hematology and Oncology

## 2022-05-30 VITALS — BP 122/65 | HR 66 | Temp 97.7°F | Resp 18 | Ht 63.0 in | Wt 131.7 lb

## 2022-05-30 DIAGNOSIS — Z170 Estrogen receptor positive status [ER+]: Secondary | ICD-10-CM

## 2022-05-30 DIAGNOSIS — D0511 Intraductal carcinoma in situ of right breast: Secondary | ICD-10-CM

## 2022-05-30 DIAGNOSIS — Z79899 Other long term (current) drug therapy: Secondary | ICD-10-CM | POA: Diagnosis not present

## 2022-05-30 DIAGNOSIS — Z923 Personal history of irradiation: Secondary | ICD-10-CM | POA: Diagnosis not present

## 2022-05-30 DIAGNOSIS — Z79811 Long term (current) use of aromatase inhibitors: Secondary | ICD-10-CM | POA: Diagnosis not present

## 2022-05-30 DIAGNOSIS — C50412 Malignant neoplasm of upper-outer quadrant of left female breast: Secondary | ICD-10-CM | POA: Insufficient documentation

## 2022-05-30 MED ORDER — LETROZOLE 2.5 MG PO TABS: 2.5000 mg | ORAL_TABLET | Freq: Every day | ORAL | 3 refills | Status: DC

## 2022-05-30 NOTE — Assessment & Plan Note (Addendum)
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  Neoadjuvant chemotherapy with dose dense Adriamycin Cytoxan followed by Taxol weekly x6 discontinued because of neuropathy 2. mastectomy with sentinel lymph node biopsy: 07/31/2021: Residual grade 2 IDC 4.1 mm, margins negative, 0/5 lymph nodes negative, ER 40% weak staining, PR negative, HER2 negative, Ki-67 40% 3. Followed by adjuvant antiestrogen therapy ------------------------------------------------------------------------------------------ Chemo-induced peripheral neuropathy: Marked improvement     Letrozole toxicities: Muscle aches and pains  Breast cancer surveillance: Mammogram 01/22/2022: [facility]: Benign Breast exam 05/30/2022: Benign  Left arm decreased range of motion: I will refer the patient to physical therapy. Return to clinic in 1 year for follow-up

## 2022-06-03 ENCOUNTER — Ambulatory Visit: Payer: BC Managed Care – PPO

## 2022-08-02 DIAGNOSIS — C50912 Malignant neoplasm of unspecified site of left female breast: Secondary | ICD-10-CM | POA: Diagnosis not present

## 2022-08-02 DIAGNOSIS — E7800 Pure hypercholesterolemia, unspecified: Secondary | ICD-10-CM | POA: Diagnosis not present

## 2022-08-02 DIAGNOSIS — Z0000 Encounter for general adult medical examination without abnormal findings: Secondary | ICD-10-CM | POA: Diagnosis not present

## 2022-08-02 DIAGNOSIS — M810 Age-related osteoporosis without current pathological fracture: Secondary | ICD-10-CM | POA: Diagnosis not present

## 2022-08-02 DIAGNOSIS — I10 Essential (primary) hypertension: Secondary | ICD-10-CM | POA: Diagnosis not present

## 2022-08-02 DIAGNOSIS — J309 Allergic rhinitis, unspecified: Secondary | ICD-10-CM | POA: Diagnosis not present

## 2022-08-05 ENCOUNTER — Other Ambulatory Visit: Payer: Self-pay

## 2022-08-05 ENCOUNTER — Telehealth: Payer: Self-pay

## 2022-08-05 DIAGNOSIS — C50412 Malignant neoplasm of upper-outer quadrant of left female breast: Secondary | ICD-10-CM

## 2022-08-05 NOTE — Telephone Encounter (Signed)
Returned Pt's call regarding PT referral. Pt state she has not yet been scheduled for PT referral. Per last MD note, amb referral for PT placed to evaluate decreased ROM in left arm (hx left breast cancer with mastectomy and sentinel bx). Called [facility] who confirmed they would call Pt to schedule. Pt verbalized understanding.

## 2022-08-25 NOTE — Therapy (Signed)
OUTPATIENT PHYSICAL THERAPY  UPPER EXTREMITY ONCOLOGY EVALUATION  Patient Name: Jasmine Maddox MRN: [id number] DOB:11/17/1960, 62 y.o., female Today's Date: 08/26/2022  END OF SESSION:  PT End of Session - 08/26/22 0759     Visit Number 1    Number of Visits 12    Date for PT Re-Evaluation 10/21/22    PT Start Time 0801    PT Stop Time 0850    PT Time Calculation (min) 49 min    Activity Tolerance Patient tolerated treatment well    Behavior During Therapy WFL for tasks assessed/performed             Past Medical History:  Diagnosis Date   Cervical cancer (HCC)    Family history of breast cancer    Hypertension    PONV (postoperative nausea and vomiting)    Past Surgical History:  Procedure Laterality Date   BREAST LUMPECTOMY WITH RADIOACTIVE SEED LOCALIZATION Right 03/20/2018   Procedure: RIGHT BREAST LUMPECTOMY WITH RADIOACTIVE SEED LOCALIZATION;  Surgeon: Claud Kelp, MD;  Location: [facility];  Service: General;  Laterality: Right;   CHOLECYSTECTOMY     MASTECTOMY W/ SENTINEL NODE BIOPSY Left 07/31/2021   Procedure: LEFT MASTECTOMY WITH LEFT AXILLARY SENTINEL NODE BIOPSY;  Surgeon: Emelia Loron, MD;  Location: [facility];  Service: General;  Laterality: Left;  GEN & PEC BLOCK   PORT-A-CATH REMOVAL Right 07/31/2021   Procedure: PORT REMOVAL;  Surgeon: Emelia Loron, MD;  Location: [facility];  Service: General;  Laterality: Right;   PORTACATH PLACEMENT Right 03/13/2021   Procedure: INSERTION PORT-A-CATH;  Surgeon: Emelia Loron, MD;  Location: [facility];  Service: General;  Laterality: Right;   Patient Active Problem List   Diagnosis Date Noted   Chemotherapy-induced peripheral neuropathy (HCC) 11/29/2021   S/P mastectomy, left 07/31/2021   Malignant neoplasm of upper-outer quadrant of left breast in female, estrogen receptor positive (HCC) 02/14/2021   Genetic testing 02/25/2018   Family  history of breast cancer    Ductal carcinoma in situ (DCIS) of right breast 02/12/2018   Esophageal reflux 05/03/2009   Benign essential hypertension 04/25/2009    PCP: Horton Marshall, PA  REFERRING PROVIDER: Serena Croissant, MD  REFERRING DIAG: s/p Left Mastectomy  THERAPY DIAG:  Malignant neoplasm of upper-outer quadrant of left female breast, unspecified estrogen receptor status (HCC)  Stiffness of left shoulder, not elsewhere classified  Aftercare following surgery for neoplasm  ONSET DATE: 12/2021  Rationale for Evaluation and Treatment: Rehabilitation  SUBJECTIVE:  SUBJECTIVE STATEMENT:  I have been banging my L. arm on a wall for some reason and I am having some pain at the lateral upper arm and pulling under my arm. I am having trouble reaching to the side and overhead. Having trouble reaching behind my back. Not really able to use the left arm for anything. Dressing, bathing, everything is hard. My husband has to help me get dressed/undressed. Hard to drive.   PERTINENT HISTORY:   Pt has a history in 2020 of Right  ER/PR positive ductal carcinoma in situ that was treated with right lumpectomy, radiotherapy, and she is now on tamoxifen. She had genetic testing in 2020 that was negative. She had a screening mammogram that showed 4 areas on the left. They were biopsied and determined to be Gr. 3 IDC ER positive at 40%, PR negative, HER2 negative, and Ki-67 is 40%. She had neoadjuvant chemo in February 2023 followed by surgery on  07/31/2021 for left mastectomy with deep Axillary SLNB, followed by radiation and Letrozole.  PAIN:  Are you having pain? Yes NPRS scale: 5/10 at rest, and stays about the same Pain location: lateral delt Pain orientation: Left  PAIN TYPE: aching, gnawing Pain description:  constant  Aggravating factors: can't sleep on left, dressing, bathing, Relieving factors: tylenol  PRECAUTIONS: Left UE lymphedema risk  RED FLAGS: None   WEIGHT BEARING RESTRICTIONS: No  FALLS:  Has patient fallen in last 6 months? No  LIVING ENVIRONMENT: Lives with: lives with their spouseand daughter Lives in: House/apartment Stairs: Yes; Internal: 11 steps; on right going up    OCCUPATION: Emergency planning/management officer. At [employer]  LEISURE: walking, exercise, dancing  HAND DOMINANCE: right   PRIOR LEVEL OF FUNCTION: Independent  PATIENT GOALS: Be able to start using my arm again, decrease pain, increase ROM   OBJECTIVE:  COGNITION: Overall cognitive status: Within functional limits for tasks assessed   PALPATION: Tight and tender left pectorals, Left UT, left mid delt insertion, prox biceps  OBSERVATIONS / OTHER ASSESSMENTS: significant UT compensation on left, scapula moving as a unit  SENSATION: Light touch: Appears intact   POSTURE: forward head, rounded shoulders    UPPER EXTREMITY AROM/PROM:  A/PROM RIGHT   eval   Shoulder extension 57  Shoulder flexion 156  Shoulder abduction 164  Shoulder internal rotation 60  Shoulder external rotation 98    (Blank rows = not tested)  A/PROM LEFT   eval  Shoulder extension 39  Shoulder flexion 80 with compensation/106 P  Shoulder abduction 60 with compensation  Shoulder internal rotation L5  Shoulder external rotation T3    (Blank rows = not tested)  CERVICAL AROM: All within functional limits:    UPPER EXTREMITY STRENGTH: Right UE 4+/5 flex/abd, IR and ER, Left IR/ER 4+/5 no pain, supraspinatus, 4- 4+5 no pain   LYMPHEDEMA ASSESSMENTS:   SURGERY TYPE/DATE: Left Mastectomy with SLNB 07/31/2021 Right lumpectomy 2020 for DCIS  NUMBER OF LYMPH NODES REMOVED: 0/5  CHEMOTHERAPY: YES  RADIATION:Yes on right   HORMONE TREATMENT: Yes, Letrozole presently  INFECTIONS: no   LYMPHEDEMA ASSESSMENTS:    LANDMARK RIGHT  eval  At axilla    15 cm proximal to olecranon process   10 cm proximal to olecranon process   Olecranon process   15 cm proximal to ulnar styloid process   10 cm proximal to ulnar styloid process   Just proximal to ulnar styloid process   Across hand at thumb web space   At base of 2nd  digit   (Blank rows = not tested)  LANDMARK LEFT  eval  At axilla    15 cm proximal to olecranon process   10 cm proximal to olecranon process   Olecranon process   15 cm proximal to ulnar styloid process   10 cm proximal to ulnar styloid process   Just proximal to ulnar styloid process   Across hand at thumb web space   At base of 2nd digit   (Blank rows = not tested)     GAIT: WNL  L-DEX LYMPHEDEMA SCREENING: The patient was assessed using the L-Dex machine today to produce a lymphedema index baseline score. The patient will be reassessed on a regular basis (typically every 3 months) to obtain new L-Dex scores. If the score is > 6.5 points away from his/her baseline score indicating onset of subclinical lymphedema, it will be recommended to wear a compression garment for 4 weeks, 12 hours per day and then be reassessed. If the score continues to be > 6.5 points from baseline at reassessment, we will initiate lymphedema treatment. Assessing in this manner has a 95% rate of preventing clinically significant lymphedema.  QUICK DASH SURVEY: 43%   TODAY'S TREATMENT:                                                                                                                                          DATE:  GH mobs posterior and inferior gr 2/3 to relax pt. Instructed in AAROM exercises and discussed keeping  shoulder down with activities. Discussed POC and treatment for shoulder limitations consistent with Adhesive capsulitis   PATIENT EDUCATION:   Education details: discussed adhesive capsulits,supine wand flexion and scaption Access Code: [email or web address] URL:  [email or web address] Date: 08/26/2022 Prepared by: Alvira Monday  Exercises - Standing Shoulder and Trunk Flexion at Table  - 2-3 x daily - 7 x weekly - 1 sets - 5 reps - Supine Shoulder Flexion Extension AAROM with Dowel  - 2-3 x daily - 7 x weekly - 1 sets - 5 reps - Standing Shoulder Internal Rotation Stretch with Towel  - 1 x daily - 7 x weekly - 1 sets - 5 reps - Standing Shoulder Internal Rotation Stretch with Hands Behind Back  - 1 x daily - 7 x weekly - 3 sets - 10 reps Person educated: Patient Education method: Explanation and Handouts Education comprehension: verbalized understanding, returned demonstration, and tactile cues required  HOME EXERCISE PROGRAM: Supine wand flexion and scaption. X 5, towel stretch for IR or use of hand, standing lat stretch  ASSESSMENT:  CLINICAL IMPRESSION: Patient is a 62 y.o. female who was seen today for physical therapy evaluation and treatment for limitations in left shoulder ROM with signs and symptoms consistent with adhesive capsulitis.. She is s/p Neoadjuvant chemotherapy and left Mastectomy on 07/31/2021 . Atleast 6 months ago after bumping her left arm on a wall several  times she started experiencing problems in her left shoulder with significant ROM restrictions and a constant achy pain. She exhibits significant UT compensation with attempts at AROM on the left. Her IR and ER is strong and painless. She has tightness and tenderness throughout the left Upper quarter. She was educated in some AAROM exercises today and did very well them.  She is very limited functionally for dressing, bathing, reaching, driving, sleeping.She will benefit from skilled PT to address deficits and return to PLOF.   OBJECTIVE IMPAIRMENTS: decreased activity tolerance, decreased knowledge of condition, decreased ROM, impaired flexibility, impaired UE functional use, postural dysfunction, and pain.   ACTIVITY LIMITATIONS: sleeping, bed mobility,  bathing, dressing, reach over head, and hygiene/grooming  PARTICIPATION LIMITATIONS: meal prep, cleaning, driving, and occupation  PERSONAL FACTORS: 1-2 comorbidities: s/p Left Mastectomy with SLNB, chemo, now with adhesive capsulitis  are also affecting patient's functional outcome.   REHAB POTENTIAL: Excellent  CLINICAL DECISION MAKING: Stable/uncomplicated  EVALUATION COMPLEXITY: Low  GOALS: Goals reviewed with patient? Yes  SHORT TERM GOALS: Target date: 09/23/2022  Pt will be independent with HEP for Left shoulder ROM  Baseline: Goal status: INITIAL  2.  Pt will have left shoulder pain decreased to 3/10 or better at rest Baseline:  Goal status: INITIAL  3.  Pt will be able to dress and bathe independently Baseline:  Goal status: INITIAL  4.  Pt will have left shoulder ROM improved by 30-40 degrees for flexion and abd for improved reaching ability Baseline: flex  80     abd 60 Goal status: INITIAL  5  LONG TERM GOALS: Target date: 10/21/2022  Pts left shoulder ROM will be within 10 degrees of right for shoulder flexion and abd for improved reaching ability Baseline:  Goal status: INITIAL  2.  Pt will be able to drive normally without hiking shoulder Baseline:  Goal status: INITIAL  3.  Quick dash will be no greater than 15% Baseline:  Goal status: INITIAL  4.  Pt will be able to sleep on left side for longer periods of time Baseline:  Goal status: INITIAL  5.  Pts left shoulder pain will be 60% improved overall Baseline:  Goal status: INITIAL  PLAN:  PT FREQUENCY: 2x/week  PT DURATION: 8 weeks  PLANNED INTERVENTIONS: Therapeutic exercises, Therapeutic activity, Neuromuscular re-education, Patient/Family education, Self Care, Joint mobilization, Dry Needling, Manual lymph drainage, Manual therapy, and Re-evaluation  PLAN FOR NEXT SESSION: left UT stretches, wall stretch for pecs , STM prn, Review HEP, add ER wand or table, GH and scapular mobs, PROM,  update HEP  Waynette Buttery, PT 08/26/2022, 9:06 AM

## 2022-08-26 ENCOUNTER — Ambulatory Visit: Payer: BC Managed Care – PPO | Attending: Hematology and Oncology

## 2022-08-26 ENCOUNTER — Other Ambulatory Visit: Payer: Self-pay

## 2022-08-26 DIAGNOSIS — M6281 Muscle weakness (generalized): Secondary | ICD-10-CM | POA: Insufficient documentation

## 2022-08-26 DIAGNOSIS — C50412 Malignant neoplasm of upper-outer quadrant of left female breast: Secondary | ICD-10-CM | POA: Insufficient documentation

## 2022-08-26 DIAGNOSIS — R293 Abnormal posture: Secondary | ICD-10-CM | POA: Insufficient documentation

## 2022-08-26 DIAGNOSIS — M25512 Pain in left shoulder: Secondary | ICD-10-CM | POA: Diagnosis not present

## 2022-08-26 DIAGNOSIS — Z483 Aftercare following surgery for neoplasm: Secondary | ICD-10-CM | POA: Diagnosis not present

## 2022-08-26 DIAGNOSIS — M25612 Stiffness of left shoulder, not elsewhere classified: Secondary | ICD-10-CM | POA: Insufficient documentation

## 2022-08-26 DIAGNOSIS — Z170 Estrogen receptor positive status [ER+]: Secondary | ICD-10-CM | POA: Diagnosis not present

## 2022-09-04 ENCOUNTER — Ambulatory Visit: Payer: BC Managed Care – PPO

## 2022-09-04 DIAGNOSIS — R293 Abnormal posture: Secondary | ICD-10-CM | POA: Diagnosis not present

## 2022-09-04 DIAGNOSIS — C50412 Malignant neoplasm of upper-outer quadrant of left female breast: Secondary | ICD-10-CM | POA: Diagnosis not present

## 2022-09-04 DIAGNOSIS — Z170 Estrogen receptor positive status [ER+]: Secondary | ICD-10-CM | POA: Diagnosis not present

## 2022-09-04 DIAGNOSIS — M25612 Stiffness of left shoulder, not elsewhere classified: Secondary | ICD-10-CM | POA: Diagnosis not present

## 2022-09-04 DIAGNOSIS — Z483 Aftercare following surgery for neoplasm: Secondary | ICD-10-CM

## 2022-09-04 DIAGNOSIS — M6281 Muscle weakness (generalized): Secondary | ICD-10-CM | POA: Diagnosis not present

## 2022-09-04 DIAGNOSIS — M25512 Pain in left shoulder: Secondary | ICD-10-CM | POA: Diagnosis not present

## 2022-09-04 NOTE — Therapy (Signed)
OUTPATIENT PHYSICAL THERAPY  UPPER EXTREMITY ONCOLOGY EVALUATION  Patient Name: Jasmine Maddox MRN: [id number] DOB:[birth year], 62 y.o., female Today's Date: 09/04/2022  END OF SESSION:  PT End of Session - 09/04/22 1601     Visit Number 2    Number of Visits 12    Date for PT Re-Evaluation 10/21/22    PT Start Time 0303    PT Stop Time 0357    PT Time Calculation (min) 54 min    Activity Tolerance Patient tolerated treatment well    Behavior During Therapy WFL for tasks assessed/performed             Past Medical History:  Diagnosis Date   Cervical cancer (HCC)    Family history of breast cancer    Hypertension    PONV (postoperative nausea and vomiting)    Past Surgical History:  Procedure Laterality Date   BREAST LUMPECTOMY WITH RADIOACTIVE SEED LOCALIZATION Right 03/20/2018   Procedure: RIGHT BREAST LUMPECTOMY WITH RADIOACTIVE SEED LOCALIZATION;  Surgeon: Claud Kelp, MD;  Location: [facility];  Service: General;  Laterality: Right;   CHOLECYSTECTOMY     MASTECTOMY W/ SENTINEL NODE BIOPSY Left 07/31/2021   Procedure: LEFT MASTECTOMY WITH LEFT AXILLARY SENTINEL NODE BIOPSY;  Surgeon: Emelia Loron, MD;  Location: [facility];  Service: General;  Laterality: Left;  GEN & PEC BLOCK   PORT-A-CATH REMOVAL Right 07/31/2021   Procedure: PORT REMOVAL;  Surgeon: Emelia Loron, MD;  Location: [facility];  Service: General;  Laterality: Right;   PORTACATH PLACEMENT Right 03/13/2021   Procedure: INSERTION PORT-A-CATH;  Surgeon: Emelia Loron, MD;  Location: [facility];  Service: General;  Laterality: Right;   Patient Active Problem List   Diagnosis Date Noted   Chemotherapy-induced peripheral neuropathy (HCC) 11/29/2021   S/P mastectomy, left 07/31/2021   Malignant neoplasm of upper-outer quadrant of left breast in female, estrogen receptor positive (HCC) 02/14/2021   Genetic testing 02/25/2018   Family  history of breast cancer    Ductal carcinoma in situ (DCIS) of right breast 02/12/2018   Esophageal reflux 05/03/2009   Benign essential hypertension 04/25/2009    PCP: Horton Marshall, PA  REFERRING PROVIDER: Serena Croissant, MD  REFERRING DIAG: s/p Left Mastectomy  THERAPY DIAG:  Malignant neoplasm of upper-outer quadrant of left female breast, unspecified estrogen receptor status (HCC)  Stiffness of left shoulder, not elsewhere classified  Aftercare following surgery for neoplasm  Left shoulder pain, unspecified chronicity  ONSET DATE: 12/2021  Rationale for Evaluation and Treatment: Rehabilitation  SUBJECTIVE:  SUBJECTIVE STATEMENT:   09/04/2022 The day after I was here I slept well. Its like the pain was gone. I catch myself with my shoulder riding up. I can sleep on my left side now. I can pull my shirt over my head now.  EVAL I have been banging my L. arm on a wall for some reason and I am having some pain at the lateral upper arm and pulling under my arm. I am having trouble reaching to the side and overhead. Having trouble reaching behind my back. Not really able to use the left arm for anything. Dressing, bathing, everything is hard. My husband has to help me get dressed/undressed. Hard to drive.   PERTINENT HISTORY:   Pt has a history in 2020 of Right  ER/PR positive ductal carcinoma in situ that was treated with right lumpectomy, radiotherapy, and she is now on tamoxifen. She had genetic testing in 2020 that was negative. She had a screening mammogram that showed 4 areas on the left. They were biopsied and determined to be Gr. 3 IDC ER positive at 40%, PR negative, HER2 negative, and Ki-67 is 40%. She had neoadjuvant chemo in February 2023 followed by surgery on  07/31/2021 for left mastectomy  with deep Axillary SLNB, followed by radiation and Letrozole.  PAIN:  Are you having pain? Yes NPRS scale: 0/10 at rest, Pain location: lateral delt Pain orientation: Left  PAIN TYPE: aching, gnawing Pain description: constant  Aggravating factors:stretching Relieving factors: tylenol  PRECAUTIONS: Left UE lymphedema risk  RED FLAGS: None   WEIGHT BEARING RESTRICTIONS: No  FALLS:  Has patient fallen in last 6 months? No  LIVING ENVIRONMENT: Lives with: lives with their spouseand daughter Lives in: House/apartment Stairs: Yes; Internal: 11 steps; on right going up    OCCUPATION: Emergency planning/management officer. At [employer]  LEISURE: walking, exercise, dancing  HAND DOMINANCE: right   PRIOR LEVEL OF FUNCTION: Independent  PATIENT GOALS: Be able to start using my arm again, decrease pain, increase ROM   OBJECTIVE:  COGNITION: Overall cognitive status: Within functional limits for tasks assessed   PALPATION: Tight and tender left pectorals, Left UT, left mid delt insertion, prox biceps  OBSERVATIONS / OTHER ASSESSMENTS: significant UT compensation on left, scapula moving as a unit  SENSATION: Light touch: Appears intact   POSTURE: forward head, rounded shoulders    UPPER EXTREMITY AROM/PROM:  A/PROM RIGHT   eval   Shoulder extension 57  Shoulder flexion 156  Shoulder abduction 164  Shoulder internal rotation 60  Shoulder external rotation 98    (Blank rows = not tested)  A/PROM LEFT   eval  Shoulder extension 39  Shoulder flexion 80 with compensation/106 P  Shoulder abduction 60 with compensation  Shoulder internal rotation L5  Shoulder external rotation T3    (Blank rows = not tested)  CERVICAL AROM: All within functional limits:    UPPER EXTREMITY STRENGTH: Right UE 4+/5 flex/abd, IR and ER, Left IR/ER 4+/5 no pain, supraspinatus, 4- 4+5 no pain   LYMPHEDEMA ASSESSMENTS:   SURGERY TYPE/DATE: Left Mastectomy with SLNB 07/31/2021 Right lumpectomy 2020  for DCIS  NUMBER OF LYMPH NODES REMOVED: 0/5  CHEMOTHERAPY: YES  RADIATION:Yes on right   HORMONE TREATMENT: Yes, Letrozole presently  INFECTIONS: no   LYMPHEDEMA ASSESSMENTS:   LANDMARK RIGHT  eval  At axilla    15 cm proximal to olecranon process   10 cm proximal to olecranon process   Olecranon process   15 cm proximal to ulnar  styloid process   10 cm proximal to ulnar styloid process   Just proximal to ulnar styloid process   Across hand at thumb web space   At base of 2nd digit   (Blank rows = not tested)  LANDMARK LEFT  eval  At axilla    15 cm proximal to olecranon process   10 cm proximal to olecranon process   Olecranon process   15 cm proximal to ulnar styloid process   10 cm proximal to ulnar styloid process   Just proximal to ulnar styloid process   Across hand at thumb web space   At base of 2nd digit   (Blank rows = not tested)     GAIT: WNL  L-DEX LYMPHEDEMA SCREENING: The patient was assessed using the L-Dex machine today to produce a lymphedema index baseline score. The patient will be reassessed on a regular basis (typically every 3 months) to obtain new L-Dex scores. If the score is > 6.5 points away from his/her baseline score indicating onset of subclinical lymphedema, it will be recommended to wear a compression garment for 4 weeks, 12 hours per day and then be reassessed. If the score continues to be > 6.5 points from baseline at reassessment, we will initiate lymphedema treatment. Assessing in this manner has a 95% rate of preventing clinically significant lymphedema.  QUICK DASH SURVEY: 43%   TODAY'S TREATMENT:                                                                                                                                          DATE:   09/04/2022 Left UT/levator stretches x 3 ea GH mobs gr 3/4 inferior and posterior, scapular mobs 4 D Supine wand flexion, scaption, ER at 45 degrees, Contract relax stretching for  left shoulder Flexion, scaption, abd, ER and IR.Multiple VC's to get pt to relax Educated pt that she Must do exercises atleast 2 x/day to see results. Updated HEP with cervical stretches and ER with wand  08/26/2022 GH mobs posterior and inferior gr 2/3 to relax pt. Instructed in AAROM exercises and discussed keeping  shoulder down with activities. Discussed POC and treatment for shoulder limitations consistent with Adhesive capsulitis   PATIENT EDUCATION:   Access Code: [email or web address] URL: [email or web address] Date: 09/04/2022 Prepared by: Alvira Monday  Exercises - Seated Cervical Sidebending AROM  - 7 x weekly - 1 sets - 3 reps - 20 hold - Seated Cervical Rotation AROM  - 2 x daily - 7 x weekly - 1 sets - 3 reps - 20 hold - Supine Shoulder External Rotation with Dowel  - 2 x daily - 7 x weekly - 1 sets - 5 reps - 20 hold Education details: discussed adhesive capsulits,supine wand flexion and scaption Access Code: [email or web address] URL: [email or web address] Date: 08/26/2022 Prepared by: Alvira Monday  Exercises - Standing Shoulder and Trunk Flexion at Table  - 2-3  x daily - 7 x weekly - 1 sets - 5 reps - Supine Shoulder Flexion Extension AAROM with Dowel  - 2-3 x daily - 7 x weekly - 1 sets - 5 reps - Standing Shoulder Internal Rotation Stretch with Towel  - 1 x daily - 7 x weekly - 1 sets - 5 reps - Standing Shoulder Internal Rotation Stretch with Hands Behind Back  - 1 x daily - 7 x weekly - 3 sets - 10 reps Person educated: Patient Education method: Explanation and Handouts Education comprehension: verbalized understanding, returned demonstration, and tactile cues required  HOME EXERCISE PROGRAM: Supine wand flexion and scaption. X 5, towel stretch for IR or use of hand, standing lat stretch  ASSESSMENT:  CLINICAL IMPRESSION: Pt has not had left shoulder pain except with movement since her evaluation visit and can now sleep on the left side and take off  shirts herself without pain. She has been doing exercises about 1 x/day.  Her end feel is very hard , and she has difficulty relaxing for PROM. No significant improvement in ROM since evaluation. Stressed importance of exercises at least 2x/day.   OBJECTIVE IMPAIRMENTS: decreased activity tolerance, decreased knowledge of condition, decreased ROM, impaired flexibility, impaired UE functional use, postural dysfunction, and pain.   ACTIVITY LIMITATIONS: sleeping, bed mobility, bathing, dressing, reach over head, and hygiene/grooming  PARTICIPATION LIMITATIONS: meal prep, cleaning, driving, and occupation  PERSONAL FACTORS: 1-2 comorbidities: s/p Left Mastectomy with SLNB, chemo, now with adhesive capsulitis  are also affecting patient's functional outcome.   REHAB POTENTIAL: Excellent  CLINICAL DECISION MAKING: Stable/uncomplicated  EVALUATION COMPLEXITY: Low  GOALS: Goals reviewed with patient? Yes  SHORT TERM GOALS: Target date: 09/23/2022  Pt will be independent with HEP for Left shoulder ROM  Baseline: Goal status: INITIAL  2.  Pt will have left shoulder pain decreased to 3/10 or better at rest Baseline:  Goal status: INITIAL  3.  Pt will be able to dress and bathe independently Baseline:  Goal status: INITIAL  4.  Pt will have left shoulder ROM improved by 30-40 degrees for flexion and abd for improved reaching ability Baseline: flex  80     abd 60 Goal status: INITIAL  5  LONG TERM GOALS: Target date: 10/21/2022  Pts left shoulder ROM will be within 10 degrees of right for shoulder flexion and abd for improved reaching ability Baseline:  Goal status: INITIAL  2.  Pt will be able to drive normally without hiking shoulder Baseline:  Goal status: INITIAL  3.  Quick dash will be no greater than 15% Baseline:  Goal status: INITIAL  4.  Pt will be able to sleep on left side for longer periods of time Baseline:  Goal status: MET  09/04/2022  5.  Pts left shoulder  pain will be 60% improved overall Baseline:  Goal status: INITIAL  PLAN:  PT FREQUENCY: 2x/week  PT DURATION: 8 weeks  PLANNED INTERVENTIONS: Therapeutic exercises, Therapeutic activity, Neuromuscular re-education, Patient/Family education, Self Care, Joint mobilization, Dry Needling, Manual lymph drainage, Manual therapy, and Re-evaluation  PLAN FOR NEXT SESSION: wall stretch for pecs , STM prn, Review HEP, add ER wand or table, GH and scapular mobs, PROM, CR stretch,update HEP  Waynette Buttery, PT 09/04/2022, 4:02 PM

## 2022-09-09 ENCOUNTER — Ambulatory Visit: Payer: BC Managed Care – PPO

## 2022-09-09 DIAGNOSIS — M6281 Muscle weakness (generalized): Secondary | ICD-10-CM | POA: Diagnosis not present

## 2022-09-09 DIAGNOSIS — M25512 Pain in left shoulder: Secondary | ICD-10-CM

## 2022-09-09 DIAGNOSIS — R293 Abnormal posture: Secondary | ICD-10-CM

## 2022-09-09 DIAGNOSIS — M25612 Stiffness of left shoulder, not elsewhere classified: Secondary | ICD-10-CM | POA: Diagnosis not present

## 2022-09-09 DIAGNOSIS — Z483 Aftercare following surgery for neoplasm: Secondary | ICD-10-CM

## 2022-09-09 DIAGNOSIS — C50412 Malignant neoplasm of upper-outer quadrant of left female breast: Secondary | ICD-10-CM

## 2022-09-09 DIAGNOSIS — Z170 Estrogen receptor positive status [ER+]: Secondary | ICD-10-CM | POA: Diagnosis not present

## 2022-09-09 NOTE — Therapy (Signed)
OUTPATIENT PHYSICAL THERAPY  UPPER EXTREMITY ONCOLOGY EVALUATION  Patient Name: Jasmine Maddox MRN: [id number] DOB:Jul 01, 1960, 62 y.o., female Today's Date: 09/09/2022  END OF SESSION:  PT End of Session - 09/09/22 1602     Visit Number 3    Number of Visits 12    Date for PT Re-Evaluation 10/21/22    PT Start Time 1603    Activity Tolerance Patient tolerated treatment well    Behavior During Therapy [facility] for tasks assessed/performed             Past Medical History:  Diagnosis Date   Cervical cancer (HCC)    Family history of breast cancer    Hypertension    PONV (postoperative nausea and vomiting)    Past Surgical History:  Procedure Laterality Date   BREAST LUMPECTOMY WITH RADIOACTIVE SEED LOCALIZATION Right 03/20/2018   Procedure: RIGHT BREAST LUMPECTOMY WITH RADIOACTIVE SEED LOCALIZATION;  Surgeon: Claud Kelp, MD;  Location: [facility];  Service: General;  Laterality: Right;   CHOLECYSTECTOMY     MASTECTOMY W/ SENTINEL NODE BIOPSY Left 07/31/2021   Procedure: LEFT MASTECTOMY WITH LEFT AXILLARY SENTINEL NODE BIOPSY;  Surgeon: Emelia Loron, MD;  Location: [facility];  Service: General;  Laterality: Left;  GEN & PEC BLOCK   PORT-A-CATH REMOVAL Right 07/31/2021   Procedure: PORT REMOVAL;  Surgeon: Emelia Loron, MD;  Location: [facility];  Service: General;  Laterality: Right;   PORTACATH PLACEMENT Right 03/13/2021   Procedure: INSERTION PORT-A-CATH;  Surgeon: Emelia Loron, MD;  Location: [facility];  Service: General;  Laterality: Right;   Patient Active Problem List   Diagnosis Date Noted   Chemotherapy-induced peripheral neuropathy (HCC) 11/29/2021   S/P mastectomy, left 07/31/2021   Malignant neoplasm of upper-outer quadrant of left breast in female, estrogen receptor positive (HCC) 02/14/2021   Genetic testing 02/25/2018   Family history of breast cancer    Ductal carcinoma in situ (DCIS)  of right breast 02/12/2018   Esophageal reflux 05/03/2009   Benign essential hypertension 04/25/2009    PCP: Horton Marshall, PA  REFERRING PROVIDER: Serena Croissant, MD  REFERRING DIAG: s/p Left Mastectomy  THERAPY DIAG:  Malignant neoplasm of upper-outer quadrant of left female breast, unspecified estrogen receptor status (HCC)  Stiffness of left shoulder, not elsewhere classified  Aftercare following surgery for neoplasm  Left shoulder pain, unspecified chronicity  Abnormal posture  Muscle weakness (generalized)  ONSET DATE: 12/2021  Rationale for Evaluation and Treatment: Rehabilitation  SUBJECTIVE:  SUBJECTIVE STATEMENT:  09/09/2022 Still no pain in the arm. I have been stretching but my husband tells me I am not relaxed. I am doing the exercises 2x/day  EVAL I have been banging my L. arm on a wall for some reason and I am having some pain at the lateral upper arm and pulling under my arm. I am having trouble reaching to the side and overhead. Having trouble reaching behind my back. Not really able to use the left arm for anything. Dressing, bathing, everything is hard. My husband has to help me get dressed/undressed. Hard to drive.   PERTINENT HISTORY:   Pt has a history in 2020 of Right  ER/PR positive ductal carcinoma in situ that was treated with right lumpectomy, radiotherapy, and she is now on tamoxifen. She had genetic testing in 2020 that was negative. She had a screening mammogram that showed 4 areas on the left. They were biopsied and determined to be Gr. 3 IDC ER positive at 40%, PR negative, HER2 negative, and Ki-67 is 40%. She had neoadjuvant chemo in February 2023 followed by surgery on  07/31/2021 for left mastectomy with deep Axillary SLNB, followed by radiation and  Letrozole.  PAIN:  Are you having pain? Yes NPRS scale: 0/10 at rest, Pain location: lateral delt Pain orientation: Left  PAIN TYPE: aching, gnawing Pain description: constant  Aggravating factors:stretching Relieving factors: tylenol  PRECAUTIONS: Left UE lymphedema risk  RED FLAGS: None   WEIGHT BEARING RESTRICTIONS: No  FALLS:  Has patient fallen in last 6 months? No  LIVING ENVIRONMENT: Lives with: lives with their spouseand daughter Lives in: House/apartment Stairs: Yes; Internal: 11 steps; on right going up    OCCUPATION: Emergency planning/management officer. At [employer]  LEISURE: walking, exercise, dancing  HAND DOMINANCE: right   PRIOR LEVEL OF FUNCTION: Independent  PATIENT GOALS: Be able to start using my arm again, decrease pain, increase ROM   OBJECTIVE:  COGNITION: Overall cognitive status: Within functional limits for tasks assessed   PALPATION: Tight and tender left pectorals, Left UT, left mid delt insertion, prox biceps  OBSERVATIONS / OTHER ASSESSMENTS: significant UT compensation on left, scapula moving as a unit  SENSATION: Light touch: Appears intact   POSTURE: forward head, rounded shoulders    UPPER EXTREMITY AROM/PROM:  A/PROM RIGHT   eval   Shoulder extension 57  Shoulder flexion 156  Shoulder abduction 164  Shoulder internal rotation 60  Shoulder external rotation 98    (Blank rows = not tested)  A/PROM LEFT   eval  Shoulder extension 39  Shoulder flexion 80 with compensation/106 P  Shoulder abduction 60 with compensation  Shoulder internal rotation L5  Shoulder external rotation T3    (Blank rows = not tested)  CERVICAL AROM: All within functional limits:    UPPER EXTREMITY STRENGTH: Right UE 4+/5 flex/abd, IR and ER, Left IR/ER 4+/5 no pain, supraspinatus, 4- 4+5 no pain   LYMPHEDEMA ASSESSMENTS:   SURGERY TYPE/DATE: Left Mastectomy with SLNB 07/31/2021 Right lumpectomy 2020 for DCIS  NUMBER OF LYMPH NODES REMOVED:  0/5  CHEMOTHERAPY: YES  RADIATION:Yes on right   HORMONE TREATMENT: Yes, Letrozole presently  INFECTIONS: no   LYMPHEDEMA ASSESSMENTS:   LANDMARK RIGHT  eval  At axilla    15 cm proximal to olecranon process   10 cm proximal to olecranon process   Olecranon process   15 cm proximal to ulnar styloid process   10 cm proximal to ulnar styloid process   Just proximal to  ulnar styloid process   Across hand at thumb web space   At base of 2nd digit   (Blank rows = not tested)  LANDMARK LEFT  eval  At axilla    15 cm proximal to olecranon process   10 cm proximal to olecranon process   Olecranon process   15 cm proximal to ulnar styloid process   10 cm proximal to ulnar styloid process   Just proximal to ulnar styloid process   Across hand at thumb web space   At base of 2nd digit   (Blank rows = not tested)     GAIT: WNL  L-DEX LYMPHEDEMA SCREENING: The patient was assessed using the L-Dex machine today to produce a lymphedema index baseline score. The patient will be reassessed on a regular basis (typically every 3 months) to obtain new L-Dex scores. If the score is > 6.5 points away from his/her baseline score indicating onset of subclinical lymphedema, it will be recommended to wear a compression garment for 4 weeks, 12 hours per day and then be reassessed. If the score continues to be > 6.5 points from baseline at reassessment, we will initiate lymphedema treatment. Assessing in this manner has a 95% rate of preventing clinically significant lymphedema.  QUICK DASH SURVEY: 43%   TODAY'S TREATMENT:                                                                                                                                          DATE:   09/09/2022 Standing lat stretch and wall stretch chest x 3 GH mobs gr 3/4 inferior and posterior, scapular mobs 4 D Supine wand flexion, scaption x 5, IR stretch behind back x 4 Supine STM to left UT, pectorals and lats with  cocoa butter Contract relax stretching for left shoulder Flexion, scaption, abd, ER and IR.Multiple VC's to get pt to relax Long arm distraction for relaxation HEP instructed for sleeper stretch, posterior capsule stretch and pec wall stretch and pt given handout.    09/04/2022 Left UT/levator stretches x 3 ea GH mobs gr 3/4 inferior and posterior, scapular mobs 4 D Supine wand flexion, scaption, ER at 45 degrees, Contract relax stretching for left shoulder Flexion, scaption, abd, ER and IR.Multiple VC's to get pt to relax Educated pt that she Must do exercises atleast 2 x/day to see results. Updated HEP with cervical stretches and ER with wand  08/26/2022 GH mobs posterior and inferior gr 2/3 to relax pt. Instructed in AAROM exercises and discussed keeping  shoulder down with activities. Discussed POC and treatment for shoulder limitations consistent with Adhesive capsulitis   PATIENT EDUCATION:  Access Code: [email or web address] URL: [email or web address] Date: 09/09/2022 Prepared by: Alvira Monday  Exercises - Standing Shoulder Posterior Capsule Stretch  - 2 x daily - 7 x weekly - 1 sets - 3-5 reps - 15-20 hold - Sleeper Stretch  - 2 x  daily - 7 x weekly - 1 sets - 3-5 reps - 15-20 hold - Doorway Pec Stretch at 60 Degrees Abduction with Arm Straight  - 2 x daily - 7 x weekly - 1 sets - 3-5 reps  Access Code: [email or web address] URL: [email or web address] Date: 09/04/2022 Prepared by: Alvira Monday  Exercises - Seated Cervical Sidebending AROM  - 7 x weekly - 1 sets - 3 reps - 20 hold - Seated Cervical Rotation AROM  - 2 x daily - 7 x weekly - 1 sets - 3 reps - 20 hold - Supine Shoulder External Rotation with Dowel  - 2 x daily - 7 x weekly - 1 sets - 5 reps - 20 hold Education details: discussed adhesive capsulits,supine wand flexion and scaption Access Code: [email or web address] URL: [email or web address] Date: 08/26/2022 Prepared by: Alvira Monday  Exercises -  Standing Shoulder and Trunk Flexion at Table  - 2-3 x daily - 7 x weekly - 1 sets - 5 reps - Supine Shoulder Flexion Extension AAROM with Dowel  - 2-3 x daily - 7 x weekly - 1 sets - 5 reps - Standing Shoulder Internal Rotation Stretch with Towel  - 1 x daily - 7 x weekly - 1 sets - 5 reps - Standing Shoulder Internal Rotation Stretch with Hands Behind Back  - 1 x daily - 7 x weekly - 3 sets - 10 reps Person educated: Patient Education method: Explanation and Handouts Education comprehension: verbalized understanding, returned demonstration, and tactile cues required  HOME EXERCISE PROGRAM: Supine wand flexion and scaption. X 5, towel stretch for IR or use of hand, standing lat stretch Psterior capsule stretch, sleeper stretch, wall pec stretch  ASSESSMENT:  CLINICAL IMPRESSION: Pts shoulder continues to be very stiff, however, she relaxed slightly better today, and her IR behind the back is improving. Her HEP was updated and she will start with DN 1x/week.   OBJECTIVE IMPAIRMENTS: decreased activity tolerance, decreased knowledge of condition, decreased ROM, impaired flexibility, impaired UE functional use, postural dysfunction, and pain.   ACTIVITY LIMITATIONS: sleeping, bed mobility, bathing, dressing, reach over head, and hygiene/grooming  PARTICIPATION LIMITATIONS: meal prep, cleaning, driving, and occupation  PERSONAL FACTORS: 1-2 comorbidities: s/p Left Mastectomy with SLNB, chemo, now with adhesive capsulitis  are also affecting patient's functional outcome.   REHAB POTENTIAL: Excellent  CLINICAL DECISION MAKING: Stable/uncomplicated  EVALUATION COMPLEXITY: Low  GOALS: Goals reviewed with patient? Yes  SHORT TERM GOALS: Target date: 09/23/2022  Pt will be independent with HEP for Left shoulder ROM  Baseline: Goal status: INITIAL  2.  Pt will have left shoulder pain decreased to 3/10 or better at rest Baseline:  Goal status: INITIAL  3.  Pt will be able to dress and  bathe independently Baseline:  Goal status: INITIAL  4.  Pt will have left shoulder ROM improved by 30-40 degrees for flexion and abd for improved reaching ability Baseline: flex  80     abd 60 Goal status: INITIAL  5  LONG TERM GOALS: Target date: 10/21/2022  Pts left shoulder ROM will be within 10 degrees of right for shoulder flexion and abd for improved reaching ability Baseline:  Goal status: INITIAL  2.  Pt will be able to drive normally without hiking shoulder Baseline:  Goal status: INITIAL  3.  Quick dash will be no greater than 15% Baseline:  Goal status: INITIAL  4.  Pt will be able to sleep on left side for longer periods of time Baseline:  Goal status: MET  09/04/2022  5.  Pts left shoulder pain will be 60% improved overall Baseline:  Goal status: INITIAL  PLAN:  PT FREQUENCY: 2x/week  PT DURATION: 8 weeks  PLANNED INTERVENTIONS: Therapeutic exercises, Therapeutic activity, Neuromuscular re-education, Patient/Family education, Self Care, Joint mobilization, Dry Needling, Manual lymph drainage, Manual therapy, and Re-evaluation  PLAN FOR NEXT SESSION: Review sleeper stretch, posterior capsule stretch ,DN, STM prn, Review HEP, add ER wand or table, GH and scapular mobs, PROM, CR stretch,update HEP  Waynette Buttery, PT 09/09/2022, 4:02 PM

## 2022-09-16 ENCOUNTER — Ambulatory Visit: Payer: BC Managed Care – PPO

## 2022-09-16 DIAGNOSIS — M6281 Muscle weakness (generalized): Secondary | ICD-10-CM

## 2022-09-16 DIAGNOSIS — M25612 Stiffness of left shoulder, not elsewhere classified: Secondary | ICD-10-CM

## 2022-09-16 DIAGNOSIS — C50412 Malignant neoplasm of upper-outer quadrant of left female breast: Secondary | ICD-10-CM | POA: Diagnosis not present

## 2022-09-16 DIAGNOSIS — R293 Abnormal posture: Secondary | ICD-10-CM | POA: Diagnosis not present

## 2022-09-16 DIAGNOSIS — M25512 Pain in left shoulder: Secondary | ICD-10-CM | POA: Diagnosis not present

## 2022-09-16 DIAGNOSIS — Z483 Aftercare following surgery for neoplasm: Secondary | ICD-10-CM | POA: Diagnosis not present

## 2022-09-16 DIAGNOSIS — Z170 Estrogen receptor positive status [ER+]: Secondary | ICD-10-CM | POA: Diagnosis not present

## 2022-09-16 NOTE — Therapy (Signed)
OUTPATIENT PHYSICAL THERAPY  UPPER EXTREMITY ONCOLOGY EVALUATION  Patient Name: Jasmine Maddox MRN: [id number] DOB:Jan 07, 1961, 62 y.o., female Today's Date: 09/16/2022  END OF SESSION:  PT End of Session - 09/16/22 1502     Visit Number 4    Number of Visits 12    Date for PT Re-Evaluation 10/21/22    PT Start Time 1503    PT Stop Time 1549    PT Time Calculation (min) 46 min    Activity Tolerance Patient tolerated treatment well    Behavior During Therapy WFL for tasks assessed/performed             Past Medical History:  Diagnosis Date   Cervical cancer (HCC)    Family history of breast cancer    Hypertension    PONV (postoperative nausea and vomiting)    Past Surgical History:  Procedure Laterality Date   BREAST LUMPECTOMY WITH RADIOACTIVE SEED LOCALIZATION Right 03/20/2018   Procedure: RIGHT BREAST LUMPECTOMY WITH RADIOACTIVE SEED LOCALIZATION;  Surgeon: Claud Kelp, MD;  Location: [facility];  Service: General;  Laterality: Right;   CHOLECYSTECTOMY     MASTECTOMY W/ SENTINEL NODE BIOPSY Left 07/31/2021   Procedure: LEFT MASTECTOMY WITH LEFT AXILLARY SENTINEL NODE BIOPSY;  Surgeon: Emelia Loron, MD;  Location: [facility];  Service: General;  Laterality: Left;  GEN & PEC BLOCK   PORT-A-CATH REMOVAL Right 07/31/2021   Procedure: PORT REMOVAL;  Surgeon: Emelia Loron, MD;  Location: [facility];  Service: General;  Laterality: Right;   PORTACATH PLACEMENT Right 03/13/2021   Procedure: INSERTION PORT-A-CATH;  Surgeon: Emelia Loron, MD;  Location: [facility];  Service: General;  Laterality: Right;   Patient Active Problem List   Diagnosis Date Noted   Chemotherapy-induced peripheral neuropathy (HCC) 11/29/2021   S/P mastectomy, left 07/31/2021   Malignant neoplasm of upper-outer quadrant of left breast in female, estrogen receptor positive (HCC) 02/14/2021   Genetic testing 02/25/2018   Family  history of breast cancer    Ductal carcinoma in situ (DCIS) of right breast 02/12/2018   Esophageal reflux 05/03/2009   Benign essential hypertension 04/25/2009    PCP: Horton Marshall, PA  REFERRING PROVIDER: Serena Croissant, MD  REFERRING DIAG: s/p Left Mastectomy  THERAPY DIAG:  Malignant neoplasm of upper-outer quadrant of left female breast, unspecified estrogen receptor status (HCC)  Stiffness of left shoulder, not elsewhere classified  Aftercare following surgery for neoplasm  Left shoulder pain, unspecified chronicity  Abnormal posture  Muscle weakness (generalized)  ONSET DATE: 12/2021  Rationale for Evaluation and Treatment: Rehabilitation  SUBJECTIVE:  SUBJECTIVE STATEMENT:  09/16/2022 09/16/2022 I can put on my shirts now; Still not having pain in my shoulder, but it is still really tight. I think it is loosening up some.  EVAL I have been banging my L. arm on a wall for some reason and I am having some pain at the lateral upper arm and pulling under my arm. I am having trouble reaching to the side and overhead. Having trouble reaching behind my back. Not really able to use the left arm for anything. Dressing, bathing, everything is hard. My husband has to help me get dressed/undressed. Hard to drive.   PERTINENT HISTORY:   Pt has a history in 2020 of Right  ER/PR positive ductal carcinoma in situ that was treated with right lumpectomy, radiotherapy, and she is now on tamoxifen. She had genetic testing in 2020 that was negative. She had a screening mammogram that showed 4 areas on the left. They were biopsied and determined to be Gr. 3 IDC ER positive at 40%, PR negative, HER2 negative, and Ki-67 is 40%. She had neoadjuvant chemo in February 2023 followed by surgery on  07/31/2021 for left  mastectomy with deep Axillary SLNB, followed by radiation and Letrozole.  PAIN:  Are you having pain? NO NPRS scale: 0/10 at rest, Pain location: lateral delt PRECAUTIONS: Left UE lymphedema risk  RED FLAGS: None   WEIGHT BEARING RESTRICTIONS: No  FALLS:  Has patient fallen in last 6 months? No  LIVING ENVIRONMENT: Lives with: lives with their spouseand daughter Lives in: House/apartment Stairs: Yes; Internal: 11 steps; on right going up    OCCUPATION: Emergency planning/management officer. At [employer]  LEISURE: walking, exercise, dancing  HAND DOMINANCE: right   PRIOR LEVEL OF FUNCTION: Independent  PATIENT GOALS: Be able to start using my arm again, decrease pain, increase ROM   OBJECTIVE:  COGNITION: Overall cognitive status: Within functional limits for tasks assessed   PALPATION: Tight and tender left pectorals, Left UT, left mid delt insertion, prox biceps  OBSERVATIONS / OTHER ASSESSMENTS: significant UT compensation on left, scapula moving as a unit  SENSATION: Light touch: Appears intact   POSTURE: forward head, rounded shoulders    UPPER EXTREMITY AROM/PROM:  A/PROM RIGHT   eval   Shoulder extension 57  Shoulder flexion 156  Shoulder abduction 164  Shoulder internal rotation 60  Shoulder external rotation 98    (Blank rows = not tested)  A/PROM LEFT   eval  Shoulder extension 39  Shoulder flexion 80 with compensation/106 P  Shoulder abduction 60 with compensation  Shoulder internal rotation L5  Shoulder external rotation T3    (Blank rows = not tested)  CERVICAL AROM: All within functional limits:    UPPER EXTREMITY STRENGTH: Right UE 4+/5 flex/abd, IR and ER, Left IR/ER 4+/5 no pain, supraspinatus, 4- 4+5 no pain   LYMPHEDEMA ASSESSMENTS:   SURGERY TYPE/DATE: Left Mastectomy with SLNB 07/31/2021 Right lumpectomy 2020 for DCIS  NUMBER OF LYMPH NODES REMOVED: 0/5  CHEMOTHERAPY: YES  RADIATION:Yes on right   HORMONE TREATMENT: Yes, Letrozole  presently  INFECTIONS: no   LYMPHEDEMA ASSESSMENTS:   LANDMARK RIGHT  eval  At axilla    15 cm proximal to olecranon process   10 cm proximal to olecranon process   Olecranon process   15 cm proximal to ulnar styloid process   10 cm proximal to ulnar styloid process   Just proximal to ulnar styloid process   Across hand at thumb web space   At base  of 2nd digit   (Blank rows = not tested)  LANDMARK LEFT  eval  At axilla    15 cm proximal to olecranon process   10 cm proximal to olecranon process   Olecranon process   15 cm proximal to ulnar styloid process   10 cm proximal to ulnar styloid process   Just proximal to ulnar styloid process   Across hand at thumb web space   At base of 2nd digit   (Blank rows = not tested)     GAIT: WNL  L-DEX LYMPHEDEMA SCREENING: The patient was assessed using the L-Dex machine today to produce a lymphedema index baseline score. The patient will be reassessed on a regular basis (typically every 3 months) to obtain new L-Dex scores. If the score is > 6.5 points away from his/her baseline score indicating onset of subclinical lymphedema, it will be recommended to wear a compression garment for 4 weeks, 12 hours per day and then be reassessed. If the score continues to be > 6.5 points from baseline at reassessment, we will initiate lymphedema treatment. Assessing in this manner has a 95% rate of preventing clinically significant lymphedema.  QUICK DASH SURVEY: 43%   TODAY'S TREATMENT:                                                                                                                                          DATE:  09/16/2022 GH mobs gr 3/4 inferior and posterior, Supine STM to left UT, pectorals and lats with cocoa butter and in SL to scapular area. Added cupping to scapula and interscapular area Supine wand flexion x 4 with right hand on bottom Contract relax stretching for left shoulder Flexion, scaption, abd, ER and  IR.Multiple VC's to get pt to relax AROM flexion, IR and ER x 5 with PT Assist, SL abd x 5 Long arm distraction for relaxation Discussed adding 1-2 # wt to wand exs for flexion and scaption. Will Try at home   09/09/2022 Standing lat stretch and wall stretch chest x 3 GH mobs gr 3/4 inferior and posterior, scapular mobs 4 D Supine wand flexion, scaption x 5, IR stretch behind back x 4 Supine STM to left UT, pectorals and lats with cocoa butter Contract relax stretching for left shoulder Flexion, scaption, abd, ER and IR.Multiple VC's to get pt to relax Long arm distraction for relaxation HEP instructed for sleeper stretch, posterior capsule stretch and pec wall stretch and pt given handout.    09/04/2022 Left UT/levator stretches x 3 ea GH mobs gr 3/4 inferior and posterior, scapular mobs 4 D Supine wand flexion, scaption, ER at 45 degrees, Contract relax stretching for left shoulder Flexion, scaption, abd, ER and IR.Multiple VC's to get pt to relax Educated pt that she Must do exercises atleast 2 x/day to see results. Updated HEP with cervical stretches and ER with wand  08/26/2022 GH  mobs posterior and inferior gr 2/3 to relax pt. Instructed in AAROM exercises and discussed keeping  shoulder down with activities. Discussed POC and treatment for shoulder limitations consistent with Adhesive capsulitis   PATIENT EDUCATION:  Access Code: [email or web address] URL: [email or web address] Date: 09/09/2022 Prepared by: Alvira Monday  Exercises - Standing Shoulder Posterior Capsule Stretch  - 2 x daily - 7 x weekly - 1 sets - 3-5 reps - 15-20 hold - Sleeper Stretch  - 2 x daily - 7 x weekly - 1 sets - 3-5 reps - 15-20 hold - Doorway Pec Stretch at 60 Degrees Abduction with Arm Straight  - 2 x daily - 7 x weekly - 1 sets - 3-5 reps  Access Code: [email or web address] URL: [email or web address] Date: 09/04/2022 Prepared by: Alvira Monday  Exercises - Seated Cervical Sidebending  AROM  - 7 x weekly - 1 sets - 3 reps - 20 hold - Seated Cervical Rotation AROM  - 2 x daily - 7 x weekly - 1 sets - 3 reps - 20 hold - Supine Shoulder External Rotation with Dowel  - 2 x daily - 7 x weekly - 1 sets - 5 reps - 20 hold Education details: discussed adhesive capsulits,supine wand flexion and scaption Access Code: [email or web address] URL: [email or web address] Date: 08/26/2022 Prepared by: Alvira Monday  Exercises - Standing Shoulder and Trunk Flexion at Table  - 2-3 x daily - 7 x weekly - 1 sets - 5 reps - Supine Shoulder Flexion Extension AAROM with Dowel  - 2-3 x daily - 7 x weekly - 1 sets - 5 reps - Standing Shoulder Internal Rotation Stretch with Towel  - 1 x daily - 7 x weekly - 1 sets - 5 reps - Standing Shoulder Internal Rotation Stretch with Hands Behind Back  - 1 x daily - 7 x weekly - 3 sets - 10 reps Person educated: Patient Education method: Explanation and Handouts Education comprehension: verbalized understanding, returned demonstration, and tactile cues required  HOME EXERCISE PROGRAM: Supine wand flexion and scaption. X 5, towel stretch for IR or use of hand, standing lat stretch Psterior capsule stretch, sleeper stretch, wall pec stretch  ASSESSMENT:  CLINICAL IMPRESSION: Pt continues with significant limitations in left shoulder ROM , with slightly more give with C-R stretching, and better capsular movement with GH mobs today.   OBJECTIVE IMPAIRMENTS: decreased activity tolerance, decreased knowledge of condition, decreased ROM, impaired flexibility, impaired UE functional use, postural dysfunction, and pain.   ACTIVITY LIMITATIONS: sleeping, bed mobility, bathing, dressing, reach over head, and hygiene/grooming  PARTICIPATION LIMITATIONS: meal prep, cleaning, driving, and occupation  PERSONAL FACTORS: 1-2 comorbidities: s/p Left Mastectomy with SLNB, chemo, now with adhesive capsulitis  are also affecting patient's functional outcome.   REHAB  POTENTIAL: Excellent  CLINICAL DECISION MAKING: Stable/uncomplicated  EVALUATION COMPLEXITY: Low  GOALS: Goals reviewed with patient? Yes  SHORT TERM GOALS: Target date: 09/23/2022  Pt will be independent with HEP for Left shoulder ROM  Baseline: Goal status: INITIAL  2.  Pt will have left shoulder pain decreased to 3/10 or better at rest Baseline:  Goal status: INITIAL  3.  Pt will be able to dress and bathe independently Baseline:  Goal status: INITIAL  4.  Pt will have left shoulder ROM improved by 30-40 degrees for flexion and abd for improved reaching ability Baseline: flex  80     abd 60 Goal status: INITIAL  5  LONG TERM GOALS: Target date: 10/21/2022  Pts left shoulder ROM will be within 10  degrees of right for shoulder flexion and abd for improved reaching ability Baseline:  Goal status: INITIAL  2.  Pt will be able to drive normally without hiking shoulder Baseline:  Goal status: INITIAL  3.  Quick dash will be no greater than 15% Baseline:  Goal status: INITIAL  4.  Pt will be able to sleep on left side for longer periods of time Baseline:  Goal status: MET  09/04/2022  5.  Pts left shoulder pain will be 60% improved overall Baseline:  Goal status: INITIAL  PLAN:  PT FREQUENCY: 2x/week  PT DURATION: 8 weeks  PLANNED INTERVENTIONS: Therapeutic exercises, Therapeutic activity, Neuromuscular re-education, Patient/Family education, Self Care, Joint mobilization, Dry Needling, Manual lymph drainage, Manual therapy, and Re-evaluation  PLAN FOR NEXT SESSION: Review sleeper stretch, posterior capsule stretch ,DN, STM prn, Review HEP, add ER wand or table, GH and scapular mobs, PROM, CR stretch,update HEP  Waynette Buttery, PT 09/16/2022, 5:26 PM

## 2022-09-26 ENCOUNTER — Ambulatory Visit: Payer: BC Managed Care – PPO

## 2022-10-01 ENCOUNTER — Ambulatory Visit: Payer: BC Managed Care – PPO | Attending: Hematology and Oncology

## 2022-10-01 DIAGNOSIS — C50412 Malignant neoplasm of upper-outer quadrant of left female breast: Secondary | ICD-10-CM | POA: Insufficient documentation

## 2022-10-01 DIAGNOSIS — R293 Abnormal posture: Secondary | ICD-10-CM | POA: Insufficient documentation

## 2022-10-01 DIAGNOSIS — M6281 Muscle weakness (generalized): Secondary | ICD-10-CM | POA: Diagnosis not present

## 2022-10-01 DIAGNOSIS — Z483 Aftercare following surgery for neoplasm: Secondary | ICD-10-CM | POA: Diagnosis not present

## 2022-10-01 DIAGNOSIS — M25612 Stiffness of left shoulder, not elsewhere classified: Secondary | ICD-10-CM | POA: Diagnosis not present

## 2022-10-01 DIAGNOSIS — M25512 Pain in left shoulder: Secondary | ICD-10-CM | POA: Diagnosis not present

## 2022-10-01 NOTE — Therapy (Signed)
OUTPATIENT PHYSICAL THERAPY  UPPER EXTREMITY ONCOLOGY EVALUATION  Patient Name: Jasmine Maddox MRN: [id number] DOB:1960-06-06, 62 y.o., female Today's Date: 10/01/2022  END OF SESSION:  PT End of Session - 10/01/22 0931     Visit Number 5    Date for PT Re-Evaluation 10/21/22    PT Start Time 0847    PT Stop Time 0928    PT Time Calculation (min) 41 min    Activity Tolerance Patient tolerated treatment well    Behavior During Therapy [facility] for tasks assessed/performed              Past Medical History:  Diagnosis Date   Cervical cancer (HCC)    Family history of breast cancer    Hypertension    PONV (postoperative nausea and vomiting)    Past Surgical History:  Procedure Laterality Date   BREAST LUMPECTOMY WITH RADIOACTIVE SEED LOCALIZATION Right 03/20/2018   Procedure: RIGHT BREAST LUMPECTOMY WITH RADIOACTIVE SEED LOCALIZATION;  Surgeon: Claud Kelp, MD;  Location: [facility];  Service: General;  Laterality: Right;   CHOLECYSTECTOMY     MASTECTOMY W/ SENTINEL NODE BIOPSY Left 07/31/2021   Procedure: LEFT MASTECTOMY WITH LEFT AXILLARY SENTINEL NODE BIOPSY;  Surgeon: Emelia Loron, MD;  Location: [facility];  Service: General;  Laterality: Left;  GEN & PEC BLOCK   PORT-A-CATH REMOVAL Right 07/31/2021   Procedure: PORT REMOVAL;  Surgeon: Emelia Loron, MD;  Location: [facility];  Service: General;  Laterality: Right;   PORTACATH PLACEMENT Right 03/13/2021   Procedure: INSERTION PORT-A-CATH;  Surgeon: Emelia Loron, MD;  Location: [facility];  Service: General;  Laterality: Right;   Patient Active Problem List   Diagnosis Date Noted   Chemotherapy-induced peripheral neuropathy (HCC) 11/29/2021   S/P mastectomy, left 07/31/2021   Malignant neoplasm of upper-outer quadrant of left breast in female, estrogen receptor positive (HCC) 02/14/2021   Genetic testing 02/25/2018   Family history of breast  cancer    Ductal carcinoma in situ (DCIS) of right breast 02/12/2018   Esophageal reflux 05/03/2009   Benign essential hypertension 04/25/2009    PCP: Horton Marshall, PA  REFERRING PROVIDER: Serena Croissant, MD  REFERRING DIAG: s/p Left Mastectomy  THERAPY DIAG:  Malignant neoplasm of upper-outer quadrant of left female breast, unspecified estrogen receptor status (HCC)  Stiffness of left shoulder, not elsewhere classified  Left shoulder pain, unspecified chronicity  Abnormal posture  ONSET DATE: 12/2021  Rationale for Evaluation and Treatment: Rehabilitation  SUBJECTIVE:  SUBJECTIVE STATEMENT:  No pain since starting PT.  My Lt shoulder feels stiff.  I'm doing all of my exercises.  EVAL I have been banging my L. arm on a wall for some reason and I am having some pain at the lateral upper arm and pulling under my arm. I am having trouble reaching to the side and overhead. Having trouble reaching behind my back. Not really able to use the left arm for anything. Dressing, bathing, everything is hard. My husband has to help me get dressed/undressed. Hard to drive.   PERTINENT HISTORY:   Pt has a history in 2020 of Right  ER/PR positive ductal carcinoma in situ that was treated with right lumpectomy, radiotherapy, and she is now on tamoxifen. She had genetic testing in 2020 that was negative. She had a screening mammogram that showed 4 areas on the left. They were biopsied and determined to be Gr. 3 IDC ER positive at 40%, PR negative, HER2 negative, and Ki-67 is 40%. She had neoadjuvant chemo in February 2023 followed by surgery on  07/31/2021 for left mastectomy with deep Axillary SLNB, followed by radiation and Letrozole.  PAIN:  Are you having pain? NO NPRS scale: 0/10 at rest, Pain location: lateral  delt PRECAUTIONS: Left UE lymphedema risk  RED FLAGS: None   WEIGHT BEARING RESTRICTIONS: No  FALLS:  Has patient fallen in last 6 months? No  LIVING ENVIRONMENT: Lives with: lives with their spouseand daughter Lives in: House/apartment Stairs: Yes; Internal: 11 steps; on right going up    OCCUPATION: Emergency planning/management officer. At [employer]  LEISURE: walking, exercise, dancing  HAND DOMINANCE: right   PRIOR LEVEL OF FUNCTION: Independent  PATIENT GOALS: Be able to start using my arm again, decrease pain, increase ROM   OBJECTIVE:  COGNITION: Overall cognitive status: Within functional limits for tasks assessed   PALPATION: Tight and tender left pectorals, Left UT, left mid delt insertion, prox biceps  OBSERVATIONS / OTHER ASSESSMENTS: significant UT compensation on left, scapula moving as a unit  SENSATION: Light touch: Appears intact   POSTURE: forward head, rounded shoulders    UPPER EXTREMITY AROM/PROM:  A/PROM RIGHT   eval   Shoulder extension 57  Shoulder flexion 156  Shoulder abduction 164  Shoulder internal rotation 60  Shoulder external rotation 98    (Blank rows = not tested)  A/PROM LEFT   eval  Shoulder extension 39  Shoulder flexion 80 with compensation/106 P  Shoulder abduction 60 with compensation  Shoulder internal rotation L5  Shoulder external rotation T3    (Blank rows = not tested)  CERVICAL AROM: All within functional limits:    UPPER EXTREMITY STRENGTH: Right UE 4+/5 flex/abd, IR and ER, Left IR/ER 4+/5 no pain, supraspinatus, 4- 4+5 no pain   LYMPHEDEMA ASSESSMENTS:   SURGERY TYPE/DATE: Left Mastectomy with SLNB 07/31/2021 Right lumpectomy 2020 for DCIS  NUMBER OF LYMPH NODES REMOVED: 0/5  CHEMOTHERAPY: YES  RADIATION:Yes on right   HORMONE TREATMENT: Yes, Letrozole presently  INFECTIONS: no   LYMPHEDEMA ASSESSMENTS:   LANDMARK RIGHT  eval  At axilla    15 cm proximal to olecranon process   10 cm proximal to  olecranon process   Olecranon process   15 cm proximal to ulnar styloid process   10 cm proximal to ulnar styloid process   Just proximal to ulnar styloid process   Across hand at thumb web space   At base of 2nd digit   (Blank rows = not tested)  LANDMARK LEFT  eval  At axilla    15 cm proximal to olecranon process   10 cm proximal to olecranon process   Olecranon process   15 cm proximal to ulnar styloid process   10 cm proximal to ulnar styloid process   Just proximal to ulnar styloid process   Across hand at thumb web space   At base of 2nd digit   (Blank rows = not tested)     GAIT: WNL  L-DEX LYMPHEDEMA SCREENING: The patient was assessed using the L-Dex machine today to produce a lymphedema index baseline score. The patient will be reassessed on a regular basis (typically every 3 months) to obtain new L-Dex scores. If the score is > 6.5 points away from his/her baseline score indicating onset of subclinical lymphedema, it will be recommended to wear a compression garment for 4 weeks, 12 hours per day and then be reassessed. If the score continues to be > 6.5 points from baseline at reassessment, we will initiate lymphedema treatment. Assessing in this manner has a 95% rate of preventing clinically significant lymphedema.  QUICK DASH SURVEY: 43%   TODAY'S TREATMENT:                                                                                                                                         DATE:  10/01/2022 Trigger Point Dry-Needling  Treatment instructions: Expect mild to moderate muscle soreness. S/S of pneumothorax if dry needled over a lung field, and to seek immediate medical attention should they occur. Patient verbalized understanding of these instructions and education.  Patient Consent Given: Yes Education handout provided: Yes Muscles treated: Lt thoracic multifidi, rhomboids, upper traps and lats Treatment response/outcome: Utilized skilled  palpation to identify trigger points.  During dry needling able to palpate muscle twitch and muscle elongation  Elongation and release after needling with scapular mobs  Skilled palpation and monitoring by PT during dry needling  DATE:  09/16/2022 GH mobs gr 3/4 inferior and posterior, Supine STM to left UT, pectorals and lats with cocoa butter and in SL to scapular area. Added cupping to scapula and interscapular area Supine wand flexion x 4 with right hand on bottom Contract relax stretching for left shoulder Flexion, scaption, abd, ER and IR.Multiple VC's to get pt to relax AROM flexion, IR and ER x 5 with PT Assist, SL abd x 5 Long arm distraction for relaxation Discussed adding 1-2 # wt to wand exs for flexion and scaption. Will Try at home   09/09/2022 Standing lat stretch and wall stretch chest x 3 GH mobs gr 3/4 inferior and posterior, scapular mobs 4 D Supine wand flexion, scaption x 5, IR stretch behind back x 4 Supine STM to left UT, pectorals and lats with cocoa butter Contract relax stretching for left shoulder Flexion, scaption, abd, ER and IR.Multiple VC's to get pt to relax Long arm distraction  for relaxation HEP instructed for sleeper stretch, posterior capsule stretch and pec wall stretch and pt given handout.    09/04/2022 Left UT/levator stretches x 3 ea GH mobs gr 3/4 inferior and posterior, scapular mobs 4 D Supine wand flexion, scaption, ER at 45 degrees, Contract relax stretching for left shoulder Flexion, scaption, abd, ER and IR.Multiple VC's to get pt to relax Educated pt that she Must do exercises atleast 2 x/day to see results. Updated HEP with cervical stretches and ER with wand   PATIENT EDUCATION:  Access Code: [email or web address] URL: [email or web address] Date: 09/09/2022 Prepared by: Alvira Monday  Exercises - Standing Shoulder Posterior Capsule Stretch  - 2 x daily - 7 x weekly - 1 sets - 3-5 reps - 15-20 hold - Sleeper Stretch  - 2 x  daily - 7 x weekly - 1 sets - 3-5 reps - 15-20 hold - Doorway Pec Stretch at 60 Degrees Abduction with Arm Straight  - 2 x daily - 7 x weekly - 1 sets - 3-5 reps  Access Code: [email or web address] URL: [email or web address] Date: 09/04/2022 Prepared by: Alvira Monday  Exercises - Seated Cervical Sidebending AROM  - 7 x weekly - 1 sets - 3 reps - 20 hold - Seated Cervical Rotation AROM  - 2 x daily - 7 x weekly - 1 sets - 3 reps - 20 hold - Supine Shoulder External Rotation with Dowel  - 2 x daily - 7 x weekly - 1 sets - 5 reps - 20 hold Education details: discussed adhesive capsulits,supine wand flexion and scaption Access Code: [email or web address] URL: [email or web address] Date: 08/26/2022 Prepared by: Alvira Monday  Exercises - Standing Shoulder and Trunk Flexion at Table  - 2-3 x daily - 7 x weekly - 1 sets - 5 reps - Supine Shoulder Flexion Extension AAROM with Dowel  - 2-3 x daily - 7 x weekly - 1 sets - 5 reps - Standing Shoulder Internal Rotation Stretch with Towel  - 1 x daily - 7 x weekly - 1 sets - 5 reps - Standing Shoulder Internal Rotation Stretch with Hands Behind Back  - 1 x daily - 7 x weekly - 3 sets - 10 reps Person educated: Patient Education method: Explanation and Handouts Education comprehension: verbalized understanding, returned demonstration, and tactile cues required  HOME EXERCISE PROGRAM: Supine wand flexion and scaption. X 5, towel stretch for IR or use of hand, standing lat stretch Psterior capsule stretch, sleeper stretch, wall pec stretch  ASSESSMENT:  CLINICAL IMPRESSION: Session today focused on DN to Lt upper quarter.  Pt with trigger points and multiple twitch responses particularly in the lats and rhomboids on the Lt.  Improved tissue mobility and reduced tension at the end of session.  PT education pt on importance of stretching post-needling for maximized benefit.  Pt continues with significant limitations in left shoulder ROM , with  slightly more give with C-R stretching, and better capsular movement with GH mobs today.   OBJECTIVE IMPAIRMENTS: decreased activity tolerance, decreased knowledge of condition, decreased ROM, impaired flexibility, impaired UE functional use, postural dysfunction, and pain.   ACTIVITY LIMITATIONS: sleeping, bed mobility, bathing, dressing, reach over head, and hygiene/grooming  PARTICIPATION LIMITATIONS: meal prep, cleaning, driving, and occupation  PERSONAL FACTORS: 1-2 comorbidities: s/p Left Mastectomy with SLNB, chemo, now with adhesive capsulitis  are also affecting patient's functional outcome.   REHAB POTENTIAL: Excellent  CLINICAL DECISION MAKING: Stable/uncomplicated  EVALUATION COMPLEXITY: Low  GOALS: Goals reviewed with patient? Yes  SHORT TERM GOALS: Target date:  09/23/2022  Pt will be independent with HEP for Left shoulder ROM  Baseline: Goal status: INITIAL  2.  Pt will have left shoulder pain decreased to 3/10 or better at rest Baseline:  Goal status: INITIAL  3.  Pt will be able to dress and bathe independently Baseline:  Goal status: INITIAL  4.  Pt will have left shoulder ROM improved by 30-40 degrees for flexion and abd for improved reaching ability Baseline: flex  80     abd 60 Goal status: INITIAL  5  LONG TERM GOALS: Target date: 10/21/2022  Pts left shoulder ROM will be within 10 degrees of right for shoulder flexion and abd for improved reaching ability Baseline:  Goal status: INITIAL  2.  Pt will be able to drive normally without hiking shoulder Baseline:  Goal status: INITIAL  3.  Quick dash will be no greater than 15% Baseline:  Goal status: INITIAL  4.  Pt will be able to sleep on left side for longer periods of time Baseline:  Goal status: MET  09/04/2022  5.  Pts left shoulder pain will be 60% improved overall Baseline:  Goal status: INITIAL  PLAN:  PT FREQUENCY: 2x/week  PT DURATION: 8 weeks  PLANNED INTERVENTIONS:  Therapeutic exercises, Therapeutic activity, Neuromuscular re-education, Patient/Family education, Self Care, Joint mobilization, Dry Needling, Manual lymph drainage, Manual therapy, and Re-evaluation  PLAN FOR NEXT SESSION: assess response to DN, Review sleeper stretch, posterior capsule stretch ,DN, STM prn, Review HEP, add ER wand or table, GH and scapular mobs, PROM, CR stretch,update HEP  Lorrene Reid, PT 10/01/22 9:36 AM

## 2022-10-03 ENCOUNTER — Ambulatory Visit: Payer: BC Managed Care – PPO

## 2022-10-03 DIAGNOSIS — M25612 Stiffness of left shoulder, not elsewhere classified: Secondary | ICD-10-CM | POA: Diagnosis not present

## 2022-10-03 DIAGNOSIS — M6281 Muscle weakness (generalized): Secondary | ICD-10-CM

## 2022-10-03 DIAGNOSIS — Z483 Aftercare following surgery for neoplasm: Secondary | ICD-10-CM

## 2022-10-03 DIAGNOSIS — C50412 Malignant neoplasm of upper-outer quadrant of left female breast: Secondary | ICD-10-CM | POA: Diagnosis not present

## 2022-10-03 DIAGNOSIS — M25512 Pain in left shoulder: Secondary | ICD-10-CM | POA: Diagnosis not present

## 2022-10-03 DIAGNOSIS — R293 Abnormal posture: Secondary | ICD-10-CM

## 2022-10-03 NOTE — Therapy (Signed)
OUTPATIENT PHYSICAL THERAPY  UPPER EXTREMITY ONCOLOGY EVALUATION  Patient Name: Jasmine Maddox MRN: [id number] DOB:July 15, 1960, 62 y.o., female Today's Date: 10/03/2022  END OF SESSION:  PT End of Session - 10/03/22 1602     Visit Number 6    Number of Visits 12    Date for PT Re-Evaluation 10/21/22    PT Start Time 1602    PT Stop Time 1658    PT Time Calculation (min) 56 min    Activity Tolerance Patient tolerated treatment well    Behavior During Therapy WFL for tasks assessed/performed              Past Medical History:  Diagnosis Date   Cervical cancer (HCC)    Family history of breast cancer    Hypertension    PONV (postoperative nausea and vomiting)    Past Surgical History:  Procedure Laterality Date   BREAST LUMPECTOMY WITH RADIOACTIVE SEED LOCALIZATION Right 03/20/2018   Procedure: RIGHT BREAST LUMPECTOMY WITH RADIOACTIVE SEED LOCALIZATION;  Surgeon: Claud Kelp, MD;  Location: [facility];  Service: General;  Laterality: Right;   CHOLECYSTECTOMY     MASTECTOMY W/ SENTINEL NODE BIOPSY Left 07/31/2021   Procedure: LEFT MASTECTOMY WITH LEFT AXILLARY SENTINEL NODE BIOPSY;  Surgeon: Emelia Loron, MD;  Location: [facility];  Service: General;  Laterality: Left;  GEN & PEC BLOCK   PORT-A-CATH REMOVAL Right 07/31/2021   Procedure: PORT REMOVAL;  Surgeon: Emelia Loron, MD;  Location: [facility];  Service: General;  Laterality: Right;   PORTACATH PLACEMENT Right 03/13/2021   Procedure: INSERTION PORT-A-CATH;  Surgeon: Emelia Loron, MD;  Location: [facility];  Service: General;  Laterality: Right;   Patient Active Problem List   Diagnosis Date Noted   Chemotherapy-induced peripheral neuropathy (HCC) 11/29/2021   S/P mastectomy, left 07/31/2021   Malignant neoplasm of upper-outer quadrant of left breast in female, estrogen receptor positive (HCC) 02/14/2021   Genetic testing 02/25/2018   Family  history of breast cancer    Ductal carcinoma in situ (DCIS) of right breast 02/12/2018   Esophageal reflux 05/03/2009   Benign essential hypertension 04/25/2009    PCP: Horton Marshall, PA  REFERRING PROVIDER: Serena Croissant, MD  REFERRING DIAG: s/p Left Mastectomy  THERAPY DIAG:  Malignant neoplasm of upper-outer quadrant of left female breast, unspecified estrogen receptor status (HCC)  Stiffness of left shoulder, not elsewhere classified  Left shoulder pain, unspecified chronicity  Abnormal posture  Aftercare following surgery for neoplasm  Muscle weakness (generalized)  ONSET DATE: 12/2021  Rationale for Evaluation and Treatment: Rehabilitation  SUBJECTIVE:  SUBJECTIVE STATEMENT:  The DN was good. I felt like I could move a little better. I still don't have any pain. I can curl my hair now.  EVAL I have been banging my L. arm on a wall for some reason and I am having some pain at the lateral upper arm and pulling under my arm. I am having trouble reaching to the side and overhead. Having trouble reaching behind my back. Not really able to use the left arm for anything. Dressing, bathing, everything is hard. My husband has to help me get dressed/undressed. Hard to drive.   PERTINENT HISTORY:   Pt has a history in 2020 of Right  ER/PR positive ductal carcinoma in situ that was treated with right lumpectomy, radiotherapy, and she is now on tamoxifen. She had genetic testing in 2020 that was negative. She had a screening mammogram that showed 4 areas on the left. They were biopsied and determined to be Gr. 3 IDC ER positive at 40%, PR negative, HER2 negative, and Ki-67 is 40%. She had neoadjuvant chemo in February 2023 followed by surgery on  07/31/2021 for left mastectomy with deep Axillary SLNB,  followed by radiation and Letrozole.  PAIN:  Are you having pain? NO NPRS scale: 0/10 at rest, Pain location: lateral delt PRECAUTIONS: Left UE lymphedema risk  RED FLAGS: None   WEIGHT BEARING RESTRICTIONS: No  FALLS:  Has patient fallen in last 6 months? No  LIVING ENVIRONMENT: Lives with: lives with their spouseand daughter Lives in: House/apartment Stairs: Yes; Internal: 11 steps; on right going up    OCCUPATION: Emergency planning/management officer. At [employer]  LEISURE: walking, exercise, dancing  HAND DOMINANCE: right   PRIOR LEVEL OF FUNCTION: Independent  PATIENT GOALS: Be able to start using my arm again, decrease pain, increase ROM   OBJECTIVE:  COGNITION: Overall cognitive status: Within functional limits for tasks assessed   PALPATION: Tight and tender left pectorals, Left UT, left mid delt insertion, prox biceps  OBSERVATIONS / OTHER ASSESSMENTS: significant UT compensation on left, scapula moving as a unit  SENSATION: Light touch: Appears intact   POSTURE: forward head, rounded shoulders    UPPER EXTREMITY AROM/PROM:  A/PROM RIGHT   eval   Shoulder extension 57  Shoulder flexion 156  Shoulder abduction 164  Shoulder internal rotation 60  Shoulder external rotation 98    (Blank rows = not tested)  A/PROM LEFT   eval LEFT 10/03/2022  Shoulder extension 39 49  Shoulder flexion 80 with compensation/106 P 114 compensation  Shoulder abduction 60 with compensation 85 with compensation  Shoulder internal rotation L5 T11  Shoulder external rotation T3 T4    (Blank rows = not tested)  CERVICAL AROM: All within functional limits:    UPPER EXTREMITY STRENGTH: Right UE 4+/5 flex/abd, IR and ER, Left IR/ER 4+/5 no pain, supraspinatus, 4- 4+5 no pain   LYMPHEDEMA ASSESSMENTS:   SURGERY TYPE/DATE: Left Mastectomy with SLNB 07/31/2021 Right lumpectomy 2020 for DCIS  NUMBER OF LYMPH NODES REMOVED: 0/5  CHEMOTHERAPY: YES  RADIATION:Yes on right   HORMONE  TREATMENT: Yes, Letrozole presently  INFECTIONS: no   LYMPHEDEMA ASSESSMENTS:   LANDMARK RIGHT  eval  At axilla    15 cm proximal to olecranon process   10 cm proximal to olecranon process   Olecranon process   15 cm proximal to ulnar styloid process   10 cm proximal to ulnar styloid process   Just proximal to ulnar styloid process   Across hand at thumb  web space   At base of 2nd digit   (Blank rows = not tested)  LANDMARK LEFT  eval  At axilla    15 cm proximal to olecranon process   10 cm proximal to olecranon process   Olecranon process   15 cm proximal to ulnar styloid process   10 cm proximal to ulnar styloid process   Just proximal to ulnar styloid process   Across hand at thumb web space   At base of 2nd digit   (Blank rows = not tested)     GAIT: WNL  L-DEX LYMPHEDEMA SCREENING: The patient was assessed using the L-Dex machine today to produce a lymphedema index baseline score. The patient will be reassessed on a regular basis (typically every 3 months) to obtain new L-Dex scores. If the score is > 6.5 points away from his/her baseline score indicating onset of subclinical lymphedema, it will be recommended to wear a compression garment for 4 weeks, 12 hours per day and then be reassessed. If the score continues to be > 6.5 points from baseline at reassessment, we will initiate lymphedema treatment. Assessing in this manner has a 95% rate of preventing clinically significant lymphedema.  QUICK DASH SURVEY: 43%   TODAY'S TREATMENT:                                                                                                                                         DATE:   10/03/2022 GH mobs gr 3/4 inferior and posterior, Left scapular mobs 4 D Supine STM to left UT, pectorals and lats with cocoa butter  Supine wand flexion and scaption x 4 with 2# wt Contract relax stretching for left shoulder Flexion, scaption, abd, ER and IR.Multiple VC's to get pt to  relax IR behind back with opposite hand Supine alphabet 2# x 1 Measured AROM at completion of rx   10/01/2022 Trigger Point Dry-Needling  Treatment instructions: Expect mild to moderate muscle soreness. S/S of pneumothorax if dry needled over a lung field, and to seek immediate medical attention should they occur. Patient verbalized understanding of these instructions and education.  Patient Consent Given: Yes Education handout provided: Yes Muscles treated: Lt thoracic multifidi, rhomboids, upper traps and lats Treatment response/outcome: Utilized skilled palpation to identify trigger points.  During dry needling able to palpate muscle twitch and muscle elongation  Elongation and release after needling with scapular mobs  Skilled palpation and monitoring by PT during dry needling  DATE:  09/16/2022 GH mobs gr 3/4 inferior and posterior, Supine STM to left UT, pectorals and lats with cocoa butter and in SL to scapular area. Added cupping to scapula and interscapular area Supine wand flexion x 4 with right hand on bottom Contract relax stretching for left shoulder Flexion, scaption, abd, ER and IR.Multiple VC's to get pt to relax AROM flexion, IR and ER x 5 with PT Assist, SL abd  x 5 Long arm distraction for relaxation Discussed adding 1-2 # wt to wand exs for flexion and scaption. Will Try at home   09/09/2022 Standing lat stretch and wall stretch chest x 3 GH mobs gr 3/4 inferior and posterior, scapular mobs 4 D Supine wand flexion, scaption x 5, IR stretch behind back x 4 Supine STM to left UT, pectorals and lats with cocoa butter Contract relax stretching for left shoulder Flexion, scaption, abd, ER and IR.Multiple VC's to get pt to relax Long arm distraction for relaxation HEP instructed for sleeper stretch, posterior capsule stretch and pec wall stretch and pt given handout.    09/04/2022 Left UT/levator stretches x 3 ea GH mobs gr 3/4 inferior and posterior, scapular mobs 4  D Supine wand flexion, scaption, ER at 45 degrees, Contract relax stretching for left shoulder Flexion, scaption, abd, ER and IR.Multiple VC's to get pt to relax Educated pt that she Must do exercises atleast 2 x/day to see results. Updated HEP with cervical stretches and ER with wand   PATIENT EDUCATION:  Access Code: [email or web address] URL: [email or web address] Date: 09/09/2022 Prepared by: Alvira Monday  Exercises - Standing Shoulder Posterior Capsule Stretch  - 2 x daily - 7 x weekly - 1 sets - 3-5 reps - 15-20 hold - Sleeper Stretch  - 2 x daily - 7 x weekly - 1 sets - 3-5 reps - 15-20 hold - Doorway Pec Stretch at 60 Degrees Abduction with Arm Straight  - 2 x daily - 7 x weekly - 1 sets - 3-5 reps  Access Code: [email or web address] URL: [email or web address] Date: 09/04/2022 Prepared by: Alvira Monday  Exercises - Seated Cervical Sidebending AROM  - 7 x weekly - 1 sets - 3 reps - 20 hold - Seated Cervical Rotation AROM  - 2 x daily - 7 x weekly - 1 sets - 3 reps - 20 hold - Supine Shoulder External Rotation with Dowel  - 2 x daily - 7 x weekly - 1 sets - 5 reps - 20 hold Education details: discussed adhesive capsulits,supine wand flexion and scaption Access Code: [email or web address] URL: [email or web address] Date: 08/26/2022 Prepared by: Alvira Monday  Exercises - Standing Shoulder and Trunk Flexion at Table  - 2-3 x daily - 7 x weekly - 1 sets - 5 reps - Supine Shoulder Flexion Extension AAROM with Dowel  - 2-3 x daily - 7 x weekly - 1 sets - 5 reps - Standing Shoulder Internal Rotation Stretch with Towel  - 1 x daily - 7 x weekly - 1 sets - 5 reps - Standing Shoulder Internal Rotation Stretch with Hands Behind Back  - 1 x daily - 7 x weekly - 3 sets - 10 reps Person educated: Patient Education method: Explanation and Handouts Education comprehension: verbalized understanding, returned demonstration, and tactile cues required  HOME EXERCISE PROGRAM: Supine  wand flexion and scaption. X 5, towel stretch for IR or use of hand, standing lat stretch Psterior capsule stretch, sleeper stretch, wall pec stretch  ASSESSMENT:  CLINICAL IMPRESSION: Pt with improving GH mobility and greater give with C-R stretching. Pt improved with all AROM, but continues with UT compensation. Pt still reports no pain in left shoulder with daily activities despite limitations in all ROM and can now curl her own hair.  OBJECTIVE IMPAIRMENTS: decreased activity tolerance, decreased knowledge of condition, decreased ROM, impaired flexibility, impaired UE functional use, postural dysfunction, and pain.   ACTIVITY LIMITATIONS: sleeping, bed mobility, bathing, dressing, reach over head, and hygiene/grooming  PARTICIPATION  LIMITATIONS: meal prep, cleaning, driving, and occupation  PERSONAL FACTORS: 1-2 comorbidities: s/p Left Mastectomy with SLNB, chemo, now with adhesive capsulitis  are also affecting patient's functional outcome.   REHAB POTENTIAL: Excellent  CLINICAL DECISION MAKING: Stable/uncomplicated  EVALUATION COMPLEXITY: Low  GOALS: Goals reviewed with patient? Yes  SHORT TERM GOALS: Target date: 09/23/2022  Pt will be independent with HEP for Left shoulder ROM  Baseline: Goal status: MET  2.  Pt will have left shoulder pain decreased to 3/10 or better at rest Baseline:  Goal status:MET 3.  Pt will be able to dress and bathe independently Baseline:  Goal status: MET 4.  Pt will have left shoulder ROM improved by 30-40 degrees for flexion and abd for improved reaching ability Baseline: flex  80     abd 60 Goal status: INITIAL  5  LONG TERM GOALS: Target date: 10/21/2022  Pts left shoulder ROM will be within 10 degrees of right for shoulder flexion and abd for improved reaching ability Baseline:  Goal status: INITIAL  2.  Pt will be able to drive normally without hiking shoulder Baseline:  Goal status: INITIAL  3.  Quick dash will be no greater  than 15% Baseline:  Goal status: INITIAL  4.  Pt will be able to sleep on left side for longer periods of time Baseline:  Goal status: MET  09/04/2022  5.  Pts left shoulder pain will be 60% improved overall Baseline:  Goal status: MET PLAN:  PT FREQUENCY: 2x/week  PT DURATION: 8 weeks  PLANNED INTERVENTIONS: Therapeutic exercises, Therapeutic activity, Neuromuscular re-education, Patient/Family education, Self Care, Joint mobilization, Dry Needling, Manual lymph drainage, Manual therapy, and Re-evaluation  PLAN FOR NEXT SESSION: assess response to DN, Review sleeper stretch, posterior capsule stretch ,DN, STM prn, Review HEP, add ER wand or table, GH and scapular mobs, PROM, CR stretch,update HEP  Alvira Monday, PT 10/03/22 5:05 PM

## 2022-10-08 ENCOUNTER — Ambulatory Visit: Payer: BC Managed Care – PPO

## 2022-10-08 DIAGNOSIS — R293 Abnormal posture: Secondary | ICD-10-CM

## 2022-10-08 DIAGNOSIS — M25512 Pain in left shoulder: Secondary | ICD-10-CM

## 2022-10-08 DIAGNOSIS — M6281 Muscle weakness (generalized): Secondary | ICD-10-CM | POA: Diagnosis not present

## 2022-10-08 DIAGNOSIS — C50412 Malignant neoplasm of upper-outer quadrant of left female breast: Secondary | ICD-10-CM | POA: Diagnosis not present

## 2022-10-08 DIAGNOSIS — M25612 Stiffness of left shoulder, not elsewhere classified: Secondary | ICD-10-CM

## 2022-10-08 DIAGNOSIS — Z483 Aftercare following surgery for neoplasm: Secondary | ICD-10-CM | POA: Diagnosis not present

## 2022-10-08 NOTE — Therapy (Signed)
OUTPATIENT PHYSICAL THERAPY  UPPER EXTREMITY TREATMENT  Patient Name: Jasmine Maddox MRN: [id number] DOB:1960/08/12, 62 y.o., female Today's Date: 10/08/2022  END OF SESSION:  PT End of Session - 10/08/22 0931     Visit Number 7    Date for PT Re-Evaluation 10/21/22    Authorization Type [id number] visit limit    Authorization - Visit Number 7    Authorization - Number of Visits 20    PT Start Time 0850    PT Stop Time 0929    PT Time Calculation (min) 39 min    Activity Tolerance Patient tolerated treatment well    Behavior During Therapy WFL for tasks assessed/performed               Past Medical History:  Diagnosis Date   Cervical cancer (HCC)    Family history of breast cancer    Hypertension    PONV (postoperative nausea and vomiting)    Past Surgical History:  Procedure Laterality Date   BREAST LUMPECTOMY WITH RADIOACTIVE SEED LOCALIZATION Right 03/20/2018   Procedure: RIGHT BREAST LUMPECTOMY WITH RADIOACTIVE SEED LOCALIZATION;  Surgeon: Claud Kelp, MD;  Location: [facility];  Service: General;  Laterality: Right;   CHOLECYSTECTOMY     MASTECTOMY W/ SENTINEL NODE BIOPSY Left 07/31/2021   Procedure: LEFT MASTECTOMY WITH LEFT AXILLARY SENTINEL NODE BIOPSY;  Surgeon: Emelia Loron, MD;  Location: [facility];  Service: General;  Laterality: Left;  GEN & PEC BLOCK   PORT-A-CATH REMOVAL Right 07/31/2021   Procedure: PORT REMOVAL;  Surgeon: Emelia Loron, MD;  Location: [facility];  Service: General;  Laterality: Right;   PORTACATH PLACEMENT Right 03/13/2021   Procedure: INSERTION PORT-A-CATH;  Surgeon: Emelia Loron, MD;  Location: [facility];  Service: General;  Laterality: Right;   Patient Active Problem List   Diagnosis Date Noted   Chemotherapy-induced peripheral neuropathy (HCC) 11/29/2021   S/P mastectomy, left 07/31/2021   Malignant neoplasm of upper-outer quadrant of left breast in  female, estrogen receptor positive (HCC) 02/14/2021   Genetic testing 02/25/2018   Family history of breast cancer    Ductal carcinoma in situ (DCIS) of right breast 02/12/2018   Esophageal reflux 05/03/2009   Benign essential hypertension 04/25/2009    PCP: Horton Marshall, PA  REFERRING PROVIDER: Serena Croissant, MD  REFERRING DIAG: s/p Left Mastectomy  THERAPY DIAG:  Malignant neoplasm of upper-outer quadrant of left female breast, unspecified estrogen receptor status (HCC)  Left shoulder pain, unspecified chronicity  Stiffness of left shoulder, not elsewhere classified  Abnormal posture  ONSET DATE: 12/2021  Rationale for Evaluation and Treatment: Rehabilitation  SUBJECTIVE:  SUBJECTIVE STATEMENT:  I am able to curl my own hair now.  Less stiff and improved movement of the Lt UE.  EVAL I have been banging my L. arm on a wall for some reason and I am having some pain at the lateral upper arm and pulling under my arm. I am having trouble reaching to the side and overhead. Having trouble reaching behind my back. Not really able to use the left arm for anything. Dressing, bathing, everything is hard. My husband has to help me get dressed/undressed. Hard to drive.   PERTINENT HISTORY:   Pt has a history in 2020 of Right  ER/PR positive ductal carcinoma in situ that was treated with right lumpectomy, radiotherapy, and she is now on tamoxifen. She had genetic testing in 2020 that was negative. She had a screening mammogram that showed 4 areas on the left. They were biopsied and determined to be Gr. 3 IDC ER positive at 40%, PR negative, HER2 negative, and Ki-67 is 40%. She had neoadjuvant chemo in February 2023 followed by surgery on  07/31/2021 for left mastectomy with deep Axillary SLNB, followed by  radiation and Letrozole.  PAIN:  Are you having pain? NO NPRS scale: 0/10 at rest, Pain location: lateral delt PRECAUTIONS: Left UE lymphedema risk  RED FLAGS: None   WEIGHT BEARING RESTRICTIONS: No  FALLS:  Has patient fallen in last 6 months? No  LIVING ENVIRONMENT: Lives with: lives with their spouseand daughter Lives in: House/apartment Stairs: Yes; Internal: 11 steps; on right going up    OCCUPATION: Emergency planning/management officer. At [employer]  LEISURE: walking, exercise, dancing  HAND DOMINANCE: right   PRIOR LEVEL OF FUNCTION: Independent  PATIENT GOALS: Be able to start using my arm again, decrease pain, increase ROM   OBJECTIVE:  COGNITION: Overall cognitive status: Within functional limits for tasks assessed   PALPATION: Tight and tender left pectorals, Left UT, left mid delt insertion, prox biceps  OBSERVATIONS / OTHER ASSESSMENTS: significant UT compensation on left, scapula moving as a unit  SENSATION: Light touch: Appears intact   POSTURE: forward head, rounded shoulders    UPPER EXTREMITY AROM/PROM:  A/PROM RIGHT   eval   Shoulder extension 57  Shoulder flexion 156  Shoulder abduction 164  Shoulder internal rotation 60  Shoulder external rotation 98    (Blank rows = not tested)  A/PROM LEFT   eval LEFT 10/03/2022  Shoulder extension 39 49  Shoulder flexion 80 with compensation/106 P 114 compensation  Shoulder abduction 60 with compensation 85 with compensation  Shoulder internal rotation L5 T11  Shoulder external rotation T3 T4    (Blank rows = not tested)  CERVICAL AROM: All within functional limits:    UPPER EXTREMITY STRENGTH: Right UE 4+/5 flex/abd, IR and ER, Left IR/ER 4+/5 no pain, supraspinatus, 4- 4+5 no pain   LYMPHEDEMA ASSESSMENTS:   SURGERY TYPE/DATE: Left Mastectomy with SLNB 07/31/2021 Right lumpectomy 2020 for DCIS  NUMBER OF LYMPH NODES REMOVED: 0/5  CHEMOTHERAPY: YES  RADIATION:Yes on right   HORMONE TREATMENT:  Yes, Letrozole presently  INFECTIONS: no   LYMPHEDEMA ASSESSMENTS:   LANDMARK RIGHT  eval  At axilla    15 cm proximal to olecranon process   10 cm proximal to olecranon process   Olecranon process   15 cm proximal to ulnar styloid process   10 cm proximal to ulnar styloid process   Just proximal to ulnar styloid process   Across hand at thumb web space   At base  of 2nd digit   (Blank rows = not tested)  LANDMARK LEFT  eval  At axilla    15 cm proximal to olecranon process   10 cm proximal to olecranon process   Olecranon process   15 cm proximal to ulnar styloid process   10 cm proximal to ulnar styloid process   Just proximal to ulnar styloid process   Across hand at thumb web space   At base of 2nd digit   (Blank rows = not tested)     GAIT: WNL  L-DEX LYMPHEDEMA SCREENING: The patient was assessed using the L-Dex machine today to produce a lymphedema index baseline score. The patient will be reassessed on a regular basis (typically every 3 months) to obtain new L-Dex scores. If the score is > 6.5 points away from his/her baseline score indicating onset of subclinical lymphedema, it will be recommended to wear a compression garment for 4 weeks, 12 hours per day and then be reassessed. If the score continues to be > 6.5 points from baseline at reassessment, we will initiate lymphedema treatment. Assessing in this manner has a 95% rate of preventing clinically significant lymphedema.  QUICK DASH SURVEY: 43%   TODAY'S TREATMENT:                                                                                                                                         DATE:  10/08/2022 Wall slides: flexion and abduction with PT providing scapular depression  Seated flexion against gravity with emphasis on scapular depression Trigger Point Dry-Needling  Treatment instructions: Expect mild to moderate muscle soreness. S/S of pneumothorax if dry needled over a lung field, and  to seek immediate medical attention should they occur. Patient verbalized understanding of these instructions and education.  Patient Consent Given: Yes Education handout provided: Yes Muscles treated: Lt thoracic multifidi, rhomboids, upper traps and lats Treatment response/outcome: Utilized skilled palpation to identify trigger points.  During dry needling able to palpate muscle twitch and muscle elongation  Elongation and release after needling with scapular mobs  Skilled palpation and monitoring by PT during dry needling  10/03/2022 GH mobs gr 3/4 inferior and posterior, Left scapular mobs 4 D Supine STM to left UT, pectorals and lats with cocoa butter  Supine wand flexion and scaption x 4 with 2# wt Contract relax stretching for left shoulder Flexion, scaption, abd, ER and IR.Multiple VC's to get pt to relax IR behind back with opposite hand Supine alphabet 2# x 1 Measured AROM at completion of rx   10/01/2022 Trigger Point Dry-Needling  Treatment instructions: Expect mild to moderate muscle soreness. S/S of pneumothorax if dry needled over a lung field, and to seek immediate medical attention should they occur. Patient verbalized understanding of these instructions and education.  Patient Consent Given: Yes Education handout provided: Yes Muscles treated: Lt thoracic multifidi, rhomboids, upper traps and lats Treatment response/outcome:  Utilized skilled palpation to identify trigger points.  During dry needling able to palpate muscle twitch and muscle elongation  Elongation and release after needling with scapular mobs  Skilled palpation and monitoring by PT during dry needling  DATE:  09/16/2022 GH mobs gr 3/4 inferior and posterior, Supine STM to left UT, pectorals and lats with cocoa butter and in SL to scapular area. Added cupping to scapula and interscapular area Supine wand flexion x 4 with right hand on bottom Contract relax stretching for left shoulder Flexion, scaption,  abd, ER and IR.Multiple VC's to get pt to relax AROM flexion, IR and ER x 5 with PT Assist, SL abd x 5 Long arm distraction for relaxation Discussed adding 1-2 # wt to wand exs for flexion and scaption. Will Try at home  PATIENT EDUCATION:  Access Code: [email or web address] URL: [email or web address] Date: 09/09/2022 Prepared by: Alvira Monday  Exercises - Standing Shoulder Posterior Capsule Stretch  - 2 x daily - 7 x weekly - 1 sets - 3-5 reps - 15-20 hold - Sleeper Stretch  - 2 x daily - 7 x weekly - 1 sets - 3-5 reps - 15-20 hold - Doorway Pec Stretch at 60 Degrees Abduction with Arm Straight  - 2 x daily - 7 x weekly - 1 sets - 3-5 reps  Access Code: [email or web address] URL: [email or web address] Date: 09/04/2022 Prepared by: Alvira Monday  Exercises - Seated Cervical Sidebending AROM  - 7 x weekly - 1 sets - 3 reps - 20 hold - Seated Cervical Rotation AROM  - 2 x daily - 7 x weekly - 1 sets - 3 reps - 20 hold - Supine Shoulder External Rotation with Dowel  - 2 x daily - 7 x weekly - 1 sets - 5 reps - 20 hold Education details: discussed adhesive capsulits,supine wand flexion and scaption Access Code: [email or web address] URL: [email or web address] Date: 08/26/2022 Prepared by: Alvira Monday  Exercises - Standing Shoulder and Trunk Flexion at Table  - 2-3 x daily - 7 x weekly - 1 sets - 5 reps - Supine Shoulder Flexion Extension AAROM with Dowel  - 2-3 x daily - 7 x weekly - 1 sets - 5 reps - Standing Shoulder Internal Rotation Stretch with Towel  - 1 x daily - 7 x weekly - 1 sets - 5 reps - Standing Shoulder Internal Rotation Stretch with Hands Behind Back  - 1 x daily - 7 x weekly - 3 sets - 10 reps Person educated: Patient Education method: Explanation and Handouts Education comprehension: verbalized understanding, returned demonstration, and tactile cues required  HOME EXERCISE PROGRAM: Supine wand flexion and scaption. X 5, towel stretch for IR or use of hand,  standing lat stretch Psterior capsule stretch, sleeper stretch, wall pec stretch  ASSESSMENT:  CLINICAL IMPRESSION: Pt reports that she has been able to use her Lt arm to curl her hair in the middle and sides,  something she was not able to do a couple of weeks ago.  Pt with improved shoulder A/ROM since the start of care and continues to compensate with scapula/upper trap.  Tactile and verbal cues provided throughout session.  Pt with good response to DN with multiple twitch responses and improved tissue mobility after DN and manual therapy today. Reduced scapular mobility and will continue to work on this.  Patient will benefit from skilled PT to address the below impairments and improve overall function.   OBJECTIVE IMPAIRMENTS: decreased activity tolerance, decreased knowledge of condition, decreased ROM, impaired flexibility, impaired UE  functional use, postural dysfunction, and pain.   ACTIVITY LIMITATIONS: sleeping, bed mobility, bathing, dressing, reach over head, and hygiene/grooming  PARTICIPATION LIMITATIONS: meal prep, cleaning, driving, and occupation  PERSONAL FACTORS: 1-2 comorbidities: s/p Left Mastectomy with SLNB, chemo, now with adhesive capsulitis  are also affecting patient's functional outcome.   REHAB POTENTIAL: Excellent  CLINICAL DECISION MAKING: Stable/uncomplicated  EVALUATION COMPLEXITY: Low  GOALS: Goals reviewed with patient? Yes  SHORT TERM GOALS: Target date: 09/23/2022  Pt will be independent with HEP for Left shoulder ROM  Baseline: Goal status: MET  2.  Pt will have left shoulder pain decreased to 3/10 or better at rest Baseline:  Goal status:MET 3.  Pt will be able to dress and bathe independently Baseline:  Goal status: MET 4.  Pt will have left shoulder ROM improved by 30-40 degrees for flexion and abd for improved reaching ability Baseline: flex  80     abd 60 Goal status: INITIAL  5  LONG TERM GOALS: Target date: 10/21/2022  Pts left  shoulder ROM will be within 10 degrees of right for shoulder flexion and abd for improved reaching ability Baseline:  Goal status: INITIAL  2.  Pt will be able to drive normally without hiking shoulder Baseline:  Goal status: INITIAL  3.  Quick dash will be no greater than 15% Baseline:  Goal status: INITIAL  4.  Pt will be able to sleep on left side for longer periods of time Baseline:  Goal status: MET  09/04/2022  5.  Pts left shoulder pain will be 60% improved overall Baseline:  Goal status: MET PLAN:  PT FREQUENCY: 2x/week  PT DURATION: 8 weeks  PLANNED INTERVENTIONS: Therapeutic exercises, Therapeutic activity, Neuromuscular re-education, Patient/Family education, Self Care, Joint mobilization, Dry Needling, Manual lymph drainage, Manual therapy, and Re-evaluation  PLAN FOR NEXT SESSION: assess response to DN, Review sleeper stretch, posterior capsule stretch ,DN, STM prn, Review HEP, add ER wand or table, GH and scapular mobs, PROM, CR stretch,update HEP  Alvira Monday, PT 10/08/22 9:34 AM

## 2022-10-10 ENCOUNTER — Ambulatory Visit: Payer: BC Managed Care – PPO

## 2022-10-10 DIAGNOSIS — Z483 Aftercare following surgery for neoplasm: Secondary | ICD-10-CM | POA: Diagnosis not present

## 2022-10-10 DIAGNOSIS — M6281 Muscle weakness (generalized): Secondary | ICD-10-CM

## 2022-10-10 DIAGNOSIS — C50412 Malignant neoplasm of upper-outer quadrant of left female breast: Secondary | ICD-10-CM

## 2022-10-10 DIAGNOSIS — M25612 Stiffness of left shoulder, not elsewhere classified: Secondary | ICD-10-CM

## 2022-10-10 DIAGNOSIS — M25512 Pain in left shoulder: Secondary | ICD-10-CM | POA: Diagnosis not present

## 2022-10-10 DIAGNOSIS — R293 Abnormal posture: Secondary | ICD-10-CM

## 2022-10-10 NOTE — Therapy (Signed)
OUTPATIENT PHYSICAL THERAPY  UPPER EXTREMITY TREATMENT  Patient Name: Jasmine Maddox MRN: [id number] DOB:Dec 13, 1960, 62 y.o., female Today's Date: 10/10/2022  END OF SESSION:  PT End of Session - 10/10/22 1603     Visit Number 8    Number of Visits 12    Date for PT Re-Evaluation 10/21/22    Authorization Type [id number] visit limit    Authorization - Visit Number 8    Authorization - Number of Visits 20    PT Start Time 1605    PT Stop Time 1700    PT Time Calculation (min) 55 min    Activity Tolerance Patient tolerated treatment well    Behavior During Therapy WFL for tasks assessed/performed               Past Medical History:  Diagnosis Date   Cervical cancer (HCC)    Family history of breast cancer    Hypertension    PONV (postoperative nausea and vomiting)    Past Surgical History:  Procedure Laterality Date   BREAST LUMPECTOMY WITH RADIOACTIVE SEED LOCALIZATION Right 03/20/2018   Procedure: RIGHT BREAST LUMPECTOMY WITH RADIOACTIVE SEED LOCALIZATION;  Surgeon: Claud Kelp, MD;  Location: [facility];  Service: General;  Laterality: Right;   CHOLECYSTECTOMY     MASTECTOMY W/ SENTINEL NODE BIOPSY Left 07/31/2021   Procedure: LEFT MASTECTOMY WITH LEFT AXILLARY SENTINEL NODE BIOPSY;  Surgeon: Emelia Loron, MD;  Location: [facility];  Service: General;  Laterality: Left;  GEN & PEC BLOCK   PORT-A-CATH REMOVAL Right 07/31/2021   Procedure: PORT REMOVAL;  Surgeon: Emelia Loron, MD;  Location: [facility];  Service: General;  Laterality: Right;   PORTACATH PLACEMENT Right 03/13/2021   Procedure: INSERTION PORT-A-CATH;  Surgeon: Emelia Loron, MD;  Location: [facility];  Service: General;  Laterality: Right;   Patient Active Problem List   Diagnosis Date Noted   Chemotherapy-induced peripheral neuropathy (HCC) 11/29/2021   S/P mastectomy, left 07/31/2021   Malignant neoplasm of upper-outer quadrant  of left breast in female, estrogen receptor positive (HCC) 02/14/2021   Genetic testing 02/25/2018   Family history of breast cancer    Ductal carcinoma in situ (DCIS) of right breast 02/12/2018   Esophageal reflux 05/03/2009   Benign essential hypertension 04/25/2009    PCP: Horton Marshall, PA  REFERRING PROVIDER: Serena Croissant, MD  REFERRING DIAG: s/p Left Mastectomy  THERAPY DIAG:  Malignant neoplasm of upper-outer quadrant of left female breast, unspecified estrogen receptor status (HCC)  Left shoulder pain, unspecified chronicity  Stiffness of left shoulder, not elsewhere classified  Abnormal posture  Aftercare following surgery for neoplasm  Muscle weakness (generalized)  ONSET DATE: 12/2021  Rationale for Evaluation and Treatment: Rehabilitation  SUBJECTIVE:  SUBJECTIVE STATEMENT:  Did well after dry needling. I'm curling the top of my hair now. Still no pain, and no pain after stretching previous visit.  EVAL I have been banging my L. arm on a wall for some reason and I am having some pain at the lateral upper arm and pulling under my arm. I am having trouble reaching to the side and overhead. Having trouble reaching behind my back. Not really able to use the left arm for anything. Dressing, bathing, everything is hard. My husband has to help me get dressed/undressed. Hard to drive.   PERTINENT HISTORY:   Pt has a history in 2020 of Right  ER/PR positive ductal carcinoma in situ that was treated with right lumpectomy, radiotherapy, and she is now on tamoxifen. She had genetic testing in 2020 that was negative. She had a screening mammogram that showed 4 areas on the left. They were biopsied and determined to be Gr. 3 IDC ER positive at 40%, PR negative, HER2 negative, and Ki-67 is 40%. She  had neoadjuvant chemo in February 2023 followed by surgery on  07/31/2021 for left mastectomy with deep Axillary SLNB, followed by radiation and Letrozole.  PAIN:  Are you having pain? NO NPRS scale: 0/10 at rest, Pain location: lateral delt PRECAUTIONS: Left UE lymphedema risk  RED FLAGS: None   WEIGHT BEARING RESTRICTIONS: No  FALLS:  Has patient fallen in last 6 months? No  LIVING ENVIRONMENT: Lives with: lives with their spouseand daughter Lives in: House/apartment Stairs: Yes; Internal: 11 steps; on right going up    OCCUPATION: Emergency planning/management officer. At [employer]  LEISURE: walking, exercise, dancing  HAND DOMINANCE: right   PRIOR LEVEL OF FUNCTION: Independent  PATIENT GOALS: Be able to start using my arm again, decrease pain, increase ROM   OBJECTIVE:  COGNITION: Overall cognitive status: Within functional limits for tasks assessed   PALPATION: Tight and tender left pectorals, Left UT, left mid delt insertion, prox biceps  OBSERVATIONS / OTHER ASSESSMENTS: significant UT compensation on left, scapula moving as a unit  SENSATION: Light touch: Appears intact   POSTURE: forward head, rounded shoulders    UPPER EXTREMITY AROM/PROM:  A/PROM RIGHT   eval   Shoulder extension 57  Shoulder flexion 156  Shoulder abduction 164  Shoulder internal rotation 60  Shoulder external rotation 98    (Blank rows = not tested)  A/PROM LEFT   eval LEFT 10/03/2022  Shoulder extension 39 49  Shoulder flexion 80 with compensation/106 P 114 compensation  Shoulder abduction 60 with compensation 85 with compensation  Shoulder internal rotation L5 T11  Shoulder external rotation T3 T4    (Blank rows = not tested)  CERVICAL AROM: All within functional limits:    UPPER EXTREMITY STRENGTH: Right UE 4+/5 flex/abd, IR and ER, Left IR/ER 4+/5 no pain, supraspinatus, 4- 4+5 no pain   LYMPHEDEMA ASSESSMENTS:   SURGERY TYPE/DATE: Left Mastectomy with SLNB 07/31/2021 Right  lumpectomy 2020 for DCIS  NUMBER OF LYMPH NODES REMOVED: 0/5  CHEMOTHERAPY: YES  RADIATION:Yes on right   HORMONE TREATMENT: Yes, Letrozole presently  INFECTIONS: no   LYMPHEDEMA ASSESSMENTS:   LANDMARK RIGHT  eval  At axilla    15 cm proximal to olecranon process   10 cm proximal to olecranon process   Olecranon process   15 cm proximal to ulnar styloid process   10 cm proximal to ulnar styloid process   Just proximal to ulnar styloid process   Across hand at thumb web space  At base of 2nd digit   (Blank rows = not tested)  LANDMARK LEFT  eval  At axilla    15 cm proximal to olecranon process   10 cm proximal to olecranon process   Olecranon process   15 cm proximal to ulnar styloid process   10 cm proximal to ulnar styloid process   Just proximal to ulnar styloid process   Across hand at thumb web space   At base of 2nd digit   (Blank rows = not tested)     GAIT: WNL  L-DEX LYMPHEDEMA SCREENING: The patient was assessed using the L-Dex machine today to produce a lymphedema index baseline score. The patient will be reassessed on a regular basis (typically every 3 months) to obtain new L-Dex scores. If the score is > 6.5 points away from his/her baseline score indicating onset of subclinical lymphedema, it will be recommended to wear a compression garment for 4 weeks, 12 hours per day and then be reassessed. If the score continues to be > 6.5 points from baseline at reassessment, we will initiate lymphedema treatment. Assessing in this manner has a 95% rate of preventing clinically significant lymphedema.  QUICK DASH SURVEY: 43%   TODAY'S TREATMENT:                                                                                                                                         DATE:  10/10/2022 GH mobs gr 3/4 inferior and posterior, Left scapular mobs 4 D Supine STM to left UT, pectorals and lats with cocoa butter  Supine wand flexion and scaption x 4  with 2# wt Contract relax stretching for left shoulder Flexion, scaption, abd, ER and IR.Multiple VC's to get pt to relax, long arm distraction Sleeper stretch IR and ER x 4 ea IR behind back with opposite hand Pendulum exs at completion of rx  10/08/2022 Wall slides: flexion and abduction with PT providing scapular depression  Seated flexion against gravity with emphasis on scapular depression Trigger Point Dry-Needling  Treatment instructions: Expect mild to moderate muscle soreness. S/S of pneumothorax if dry needled over a lung field, and to seek immediate medical attention should they occur. Patient verbalized understanding of these instructions and education.  Patient Consent Given: Yes Education handout provided: Yes Muscles treated: Lt thoracic multifidi, rhomboids, upper traps and lats Treatment response/outcome: Utilized skilled palpation to identify trigger points.  During dry needling able to palpate muscle twitch and muscle elongation  Elongation and release after needling with scapular mobs  Skilled palpation and monitoring by PT during dry needling  10/03/2022 GH mobs gr 3/4 inferior and posterior, Left scapular mobs 4 D Supine STM to left UT, pectorals and lats with cocoa butter  Supine wand flexion and scaption x 4 with 2# wt Contract relax stretching for left shoulder Flexion, scaption, abd, ER and IR.Multiple VC's to get pt to relax IR  behind back with opposite hand Supine alphabet 2# x 1 Measured AROM at completion of rx   10/01/2022 Trigger Point Dry-Needling  Treatment instructions: Expect mild to moderate muscle soreness. S/S of pneumothorax if dry needled over a lung field, and to seek immediate medical attention should they occur. Patient verbalized understanding of these instructions and education.  Patient Consent Given: Yes Education handout provided: Yes Muscles treated: Lt thoracic multifidi, rhomboids, upper traps and lats Treatment response/outcome:  Utilized skilled palpation to identify trigger points.  During dry needling able to palpate muscle twitch and muscle elongation  Elongation and release after needling with scapular mobs  Skilled palpation and monitoring by PT during dry needling  DATE:  09/16/2022 GH mobs gr 3/4 inferior and posterior, Supine STM to left UT, pectorals and lats with cocoa butter and in SL to scapular area. Added cupping to scapula and interscapular area Supine wand flexion x 4 with right hand on bottom Contract relax stretching for left shoulder Flexion, scaption, abd, ER and IR.Multiple VC's to get pt to relax AROM flexion, IR and ER x 5 with PT Assist, SL abd x 5 Long arm distraction for relaxation Discussed adding 1-2 # wt to wand exs for flexion and scaption. Will Try at home  PATIENT EDUCATION:  Access Code: [email or web address] URL: [email or web address] Date: 09/09/2022 Prepared by: Alvira Monday  Exercises - Standing Shoulder Posterior Capsule Stretch  - 2 x daily - 7 x weekly - 1 sets - 3-5 reps - 15-20 hold - Sleeper Stretch  - 2 x daily - 7 x weekly - 1 sets - 3-5 reps - 15-20 hold - Doorway Pec Stretch at 60 Degrees Abduction with Arm Straight  - 2 x daily - 7 x weekly - 1 sets - 3-5 reps  Access Code: [email or web address] URL: [email or web address] Date: 09/04/2022 Prepared by: Alvira Monday  Exercises - Seated Cervical Sidebending AROM  - 7 x weekly - 1 sets - 3 reps - 20 hold - Seated Cervical Rotation AROM  - 2 x daily - 7 x weekly - 1 sets - 3 reps - 20 hold - Supine Shoulder External Rotation with Dowel  - 2 x daily - 7 x weekly - 1 sets - 5 reps - 20 hold Education details: discussed adhesive capsulits,supine wand flexion and scaption Access Code: [email or web address] URL: [email or web address] Date: 08/26/2022 Prepared by: Alvira Monday  Exercises - Standing Shoulder and Trunk Flexion at Table  - 2-3 x daily - 7 x weekly - 1 sets - 5 reps - Supine Shoulder Flexion  Extension AAROM with Dowel  - 2-3 x daily - 7 x weekly - 1 sets - 5 reps - Standing Shoulder Internal Rotation Stretch with Towel  - 1 x daily - 7 x weekly - 1 sets - 5 reps - Standing Shoulder Internal Rotation Stretch with Hands Behind Back  - 1 x daily - 7 x weekly - 3 sets - 10 reps Person educated: Patient Education method: Explanation and Handouts Education comprehension: verbalized understanding, returned demonstration, and tactile cues required  HOME EXERCISE PROGRAM: Supine wand flexion and scaption. X 5, towel stretch for IR or use of hand, standing lat stretch Psterior capsule stretch, sleeper stretch, wall pec stretch  ASSESSMENT:  CLINICAL IMPRESSION:  Pt is improving functionally; ie curling the top of her hair and despite pain with stretching still has no day or night time pain. She continues to work hard, but continues with firm end feels in all directions. OBJECTIVE IMPAIRMENTS: decreased activity tolerance, decreased  knowledge of condition, decreased ROM, impaired flexibility, impaired UE functional use, postural dysfunction, and pain.   ACTIVITY LIMITATIONS: sleeping, bed mobility, bathing, dressing, reach over head, and hygiene/grooming  PARTICIPATION LIMITATIONS: meal prep, cleaning, driving, and occupation  PERSONAL FACTORS: 1-2 comorbidities: s/p Left Mastectomy with SLNB, chemo, now with adhesive capsulitis  are also affecting patient's functional outcome.   REHAB POTENTIAL: Excellent  CLINICAL DECISION MAKING: Stable/uncomplicated  EVALUATION COMPLEXITY: Low  GOALS: Goals reviewed with patient? Yes  SHORT TERM GOALS: Target date: 09/23/2022  Pt will be independent with HEP for Left shoulder ROM  Baseline: Goal status: MET  2.  Pt will have left shoulder pain decreased to 3/10 or better at rest Baseline:  Goal status:MET 3.  Pt will be able to dress and bathe independently Baseline:  Goal status: MET 4.  Pt will have left shoulder ROM improved by  30-40 degrees for flexion and abd for improved reaching ability Baseline: flex  80     abd 60 Goal status: INITIAL  5  LONG TERM GOALS: Target date: 10/21/2022  Pts left shoulder ROM will be within 10 degrees of right for shoulder flexion and abd for improved reaching ability Baseline:  Goal status: INITIAL  2.  Pt will be able to drive normally without hiking shoulder Baseline:  Goal status: INITIAL  3.  Quick dash will be no greater than 15% Baseline:  Goal status: INITIAL  4.  Pt will be able to sleep on left side for longer periods of time Baseline:  Goal status: MET  09/04/2022  5.  Pts left shoulder pain will be 60% improved overall Baseline:  Goal status: MET PLAN:  PT FREQUENCY: 2x/week  PT DURATION: 8 weeks  PLANNED INTERVENTIONS: Therapeutic exercises, Therapeutic activity, Neuromuscular re-education, Patient/Family education, Self Care, Joint mobilization, Dry Needling, Manual lymph drainage, Manual therapy, and Re-evaluation  PLAN FOR NEXT SESSION: assess response to DN, Review sleeper stretch, posterior capsule stretch ,DN, STM prn, Review HEP, add ER wand or table, GH and scapular mobs, PROM, CR stretch,update HEP  Alvira Monday, PT 10/10/22 5:10 PM

## 2022-10-14 ENCOUNTER — Ambulatory Visit: Payer: BC Managed Care – PPO

## 2022-10-14 DIAGNOSIS — R293 Abnormal posture: Secondary | ICD-10-CM | POA: Diagnosis not present

## 2022-10-14 DIAGNOSIS — Z483 Aftercare following surgery for neoplasm: Secondary | ICD-10-CM | POA: Diagnosis not present

## 2022-10-14 DIAGNOSIS — C50412 Malignant neoplasm of upper-outer quadrant of left female breast: Secondary | ICD-10-CM

## 2022-10-14 DIAGNOSIS — M25512 Pain in left shoulder: Secondary | ICD-10-CM

## 2022-10-14 DIAGNOSIS — M25612 Stiffness of left shoulder, not elsewhere classified: Secondary | ICD-10-CM

## 2022-10-14 DIAGNOSIS — M6281 Muscle weakness (generalized): Secondary | ICD-10-CM | POA: Diagnosis not present

## 2022-10-14 NOTE — Therapy (Addendum)
 OUTPATIENT PHYSICAL THERAPY  UPPER EXTREMITY TREATMENT  Patient Name: Jasmine Maddox MRN: [id number] DOB:1960/03/03, 62 y.o., female Today's Date: 10/14/2022  END OF SESSION:  PT End of Session - 10/14/22 1602     Visit Number 9    Number of Visits 12    Date for PT Re-Evaluation 10/21/22    Authorization - Visit Number 9    Authorization - Number of Visits 20    PT Start Time 1603    PT Stop Time 1655    PT Time Calculation (min) 52 min    Activity Tolerance Patient tolerated treatment well    Behavior During Therapy WFL for tasks assessed/performed               Past Medical History:  Diagnosis Date   Cervical cancer (HCC)    Family history of breast cancer    Hypertension    PONV (postoperative nausea and vomiting)    Past Surgical History:  Procedure Laterality Date   BREAST LUMPECTOMY WITH RADIOACTIVE SEED LOCALIZATION Right 03/20/2018   Procedure: RIGHT BREAST LUMPECTOMY WITH RADIOACTIVE SEED LOCALIZATION;  Surgeon: Claud Kelp, MD;  Location: [facility];  Service: General;  Laterality: Right;   CHOLECYSTECTOMY     MASTECTOMY W/ SENTINEL NODE BIOPSY Left 07/31/2021   Procedure: LEFT MASTECTOMY WITH LEFT AXILLARY SENTINEL NODE BIOPSY;  Surgeon: Emelia Loron, MD;  Location: [facility];  Service: General;  Laterality: Left;  GEN & PEC BLOCK   PORT-A-CATH REMOVAL Right 07/31/2021   Procedure: PORT REMOVAL;  Surgeon: Emelia Loron, MD;  Location: [facility];  Service: General;  Laterality: Right;   PORTACATH PLACEMENT Right 03/13/2021   Procedure: INSERTION PORT-A-CATH;  Surgeon: Emelia Loron, MD;  Location: [facility];  Service: General;  Laterality: Right;   Patient Active Problem List   Diagnosis Date Noted   Chemotherapy-induced peripheral neuropathy (HCC) 11/29/2021   S/P mastectomy, left 07/31/2021   Malignant neoplasm of upper-outer quadrant of left breast in female, estrogen  receptor positive (HCC) 02/14/2021   Genetic testing 02/25/2018   Family history of breast cancer    Ductal carcinoma in situ (DCIS) of right breast 02/12/2018   Esophageal reflux 05/03/2009   Benign essential hypertension 04/25/2009    PCP: Horton Marshall, PA  REFERRING PROVIDER: Serena Croissant, MD  REFERRING DIAG: s/p Left Mastectomy  THERAPY DIAG:  Malignant neoplasm of upper-outer quadrant of left female breast, unspecified estrogen receptor status (HCC)  Left shoulder pain, unspecified chronicity  Stiffness of left shoulder, not elsewhere classified  Abnormal posture  Aftercare following surgery for neoplasm  Muscle weakness (generalized)  ONSET DATE: 12/2021  Rationale for Evaluation and Treatment: Rehabilitation  SUBJECTIVE:  SUBJECTIVE STATEMENT:  I was really tired after last visit, but no increased pain and no present pain  EVAL I have been banging my L. arm on a wall for some reason and I am having some pain at the lateral upper arm and pulling under my arm. I am having trouble reaching to the side and overhead. Having trouble reaching behind my back. Not really able to use the left arm for anything. Dressing, bathing, everything is hard. My husband has to help me get dressed/undressed. Hard to drive.   PERTINENT HISTORY:   Pt has a history in 2020 of Right  ER/PR positive ductal carcinoma in situ that was treated with right lumpectomy, radiotherapy, and she is now on tamoxifen. She had genetic testing in 2020 that was negative. She had a screening mammogram that showed 4 areas on the left. They were biopsied and determined to be Gr. 3 IDC ER positive at 40%, PR negative, HER2 negative, and Ki-67 is 40%. She had neoadjuvant chemo in February 2023 followed by surgery on  07/31/2021 for left  mastectomy with deep Axillary SLNB, followed by radiation and Letrozole.  PAIN:  Are you having pain? NO NPRS scale: 0/10 at rest, Pain location: lateral delt PRECAUTIONS: Left UE lymphedema risk  RED FLAGS: None   WEIGHT BEARING RESTRICTIONS: No  FALLS:  Has patient fallen in last 6 months? No  LIVING ENVIRONMENT: Lives with: lives with their spouseand daughter Lives in: House/apartment Stairs: Yes; Internal: 11 steps; on right going up    OCCUPATION: Emergency planning/management officer. At [employer]  LEISURE: walking, exercise, dancing  HAND DOMINANCE: right   PRIOR LEVEL OF FUNCTION: Independent  PATIENT GOALS: Be able to start using my arm again, decrease pain, increase ROM   OBJECTIVE:  COGNITION: Overall cognitive status: Within functional limits for tasks assessed   PALPATION: Tight and tender left pectorals, Left UT, left mid delt insertion, prox biceps  OBSERVATIONS / OTHER ASSESSMENTS: significant UT compensation on left, scapula moving as a unit  SENSATION: Light touch: Appears intact   POSTURE: forward head, rounded shoulders    UPPER EXTREMITY AROM/PROM:  A/PROM RIGHT   eval   Shoulder extension 57  Shoulder flexion 156  Shoulder abduction 164  Shoulder internal rotation 60  Shoulder external rotation 98    (Blank rows = not tested)  A/PROM LEFT   eval LEFT 10/03/2022  Shoulder extension 39 49  Shoulder flexion 80 with compensation/106 P 114 compensation  Shoulder abduction 60 with compensation 85 with compensation  Shoulder internal rotation L5 T11  Shoulder external rotation T3 T4    (Blank rows = not tested)  CERVICAL AROM: All within functional limits:    UPPER EXTREMITY STRENGTH: Right UE 4+/5 flex/abd, IR and ER, Left IR/ER 4+/5 no pain, supraspinatus, 4- 4+5 no pain   LYMPHEDEMA ASSESSMENTS:   SURGERY TYPE/DATE: Left Mastectomy with SLNB 07/31/2021 Right lumpectomy 2020 for DCIS  NUMBER OF LYMPH NODES REMOVED: 0/5  CHEMOTHERAPY:  YES  RADIATION:Yes on right   HORMONE TREATMENT: Yes, Letrozole presently  INFECTIONS: no   LYMPHEDEMA ASSESSMENTS:   LANDMARK RIGHT  eval  At axilla    15 cm proximal to olecranon process   10 cm proximal to olecranon process   Olecranon process   15 cm proximal to ulnar styloid process   10 cm proximal to ulnar styloid process   Just proximal to ulnar styloid process   Across hand at thumb web space   At base of 2nd digit   (  Blank rows = not tested)  LANDMARK LEFT  eval  At axilla    15 cm proximal to olecranon process   10 cm proximal to olecranon process   Olecranon process   15 cm proximal to ulnar styloid process   10 cm proximal to ulnar styloid process   Just proximal to ulnar styloid process   Across hand at thumb web space   At base of 2nd digit   (Blank rows = not tested)     GAIT: WNL  L-DEX LYMPHEDEMA SCREENING: The patient was assessed using the L-Dex machine today to produce a lymphedema index baseline score. The patient will be reassessed on a regular basis (typically every 3 months) to obtain new L-Dex scores. If the score is > 6.5 points away from his/her baseline score indicating onset of subclinical lymphedema, it will be recommended to wear a compression garment for 4 weeks, 12 hours per day and then be reassessed. If the score continues to be > 6.5 points from baseline at reassessment, we will initiate lymphedema treatment. Assessing in this manner has a 95% rate of preventing clinically significant lymphedema.  QUICK DASH SURVEY: 43%   TODAY'S TREATMENT:                                                                                                                                         DATE:  10/14/2022  GH mobs gr 3/4 inferior and posterior, Supine STM to left UT, pectorals and lats with cocoa butter  Supine wand flexion and scaption x 4 with 2# wt Supine scapular series: scaption, horizontal abd, ER with yellow all x 5 Supine alphabet  x 1, serratus punch 0# 1 x 15 Contract relax stretching for left shoulder Flexion, scaption, abd, ER and IR.Multiple VC's to get pt to relax, long arm distraction SL sleeper stretch x 3 ea Showed pt sitting ER stretch for daytime desk, reviewed posterior capsule stretch and gave pictures for her to take to work   10/10/2022 GH mobs gr 3/4 inferior and posterior, Left scapular mobs 4 D Supine STM to left UT, pectorals and lats with cocoa butter  Supine wand flexion and scaption x 4 with 2# wt Contract relax stretching for left shoulder Flexion, scaption, abd, ER and IR.Multiple VC's to get pt to relax, long arm distraction Sleeper stretch IR and ER x 4 ea IR behind back with opposite hand Pendulum exs at completion of rx  10/08/2022 Wall slides: flexion and abduction with PT providing scapular depression  Seated flexion against gravity with emphasis on scapular depression Trigger Point Dry-Needling  Treatment instructions: Expect mild to moderate muscle soreness. S/S of pneumothorax if dry needled over a lung field, and to seek immediate medical attention should they occur. Patient verbalized understanding of these instructions and education.  Patient Consent Given: Yes Education handout provided: Yes Muscles treated: Lt thoracic multifidi, rhomboids, upper traps and  lats Treatment response/outcome: Utilized skilled palpation to identify trigger points.  During dry needling able to palpate muscle twitch and muscle elongation  Elongation and release after needling with scapular mobs  Skilled palpation and monitoring by PT during dry needling  10/03/2022 GH mobs gr 3/4 inferior and posterior, Left scapular mobs 4 D Supine STM to left UT, pectorals and lats with cocoa butter  Supine wand flexion and scaption x 4 with 2# wt Contract relax stretching for left shoulder Flexion, scaption, abd, ER and IR.Multiple VC's to get pt to relax IR behind back with opposite hand Supine alphabet 2# x  1 Measured AROM at completion of rx   10/01/2022 Trigger Point Dry-Needling  Treatment instructions: Expect mild to moderate muscle soreness. S/S of pneumothorax if dry needled over a lung field, and to seek immediate medical attention should they occur. Patient verbalized understanding of these instructions and education.  Patient Consent Given: Yes Education handout provided: Yes Muscles treated: Lt thoracic multifidi, rhomboids, upper traps and lats Treatment response/outcome: Utilized skilled palpation to identify trigger points.  During dry needling able to palpate muscle twitch and muscle elongation  Elongation and release after needling with scapular mobs  Skilled palpation and monitoring by PT during dry needling  DATE:  09/16/2022 GH mobs gr 3/4 inferior and posterior, Supine STM to left UT, pectorals and lats with cocoa butter and in SL to scapular area. Added cupping to scapula and interscapular area Supine wand flexion x 4 with right hand on bottom Contract relax stretching for left shoulder Flexion, scaption, abd, ER and IR.Multiple VC's to get pt to relax AROM flexion, IR and ER x 5 with PT Assist, SL abd x 5 Long arm distraction for relaxation Discussed adding 1-2 # wt to wand exs for flexion and scaption. Will Try at home  PATIENT EDUCATION:  Access Code: [email or web address] URL: [email or web address] Date: 10/14/2022 Prepared by: Alvira Monday  Exercises - Standing Shoulder Posterior Capsule Stretch  - 1 x daily - 7 x weekly - 1 sets - 3 reps - 15 hold - Seated Shoulder External Rotation PROM on Table  - 1 x daily - 7 x weekly - 1 sets - 3 reps - 20 hold - Standing 'L' Stretch at Counter  - 1 x daily - 7 x weekly - 1 sets - 3 reps - 20 hold - Doorway Pec Stretch at 60 Degrees Abduction with Arm Straight  - 1 x daily - 7 x weekly - 1 sets - 3 reps - 20 hold Access Code: [email or web address] URL: [email or web address] Date: 09/09/2022 Prepared by: Alvira Monday  Exercises - Standing Shoulder Posterior Capsule Stretch  - 2 x daily - 7 x weekly - 1 sets - 3-5 reps - 15-20 hold - Sleeper Stretch  - 2 x daily - 7 x weekly - 1 sets - 3-5 reps - 15-20 hold - Doorway Pec Stretch at 60 Degrees Abduction with Arm Straight  - 2 x daily - 7 x weekly - 1 sets - 3-5 reps  Access Code: [email or web address] URL: [email or web address] Date: 09/04/2022 Prepared by: Alvira Monday  Exercises - Seated Cervical Sidebending AROM  - 7 x weekly - 1 sets - 3 reps - 20 hold - Seated Cervical Rotation AROM  - 2 x daily - 7 x weekly - 1 sets - 3 reps - 20 hold - Supine Shoulder External Rotation with Dowel  - 2 x daily - 7 x weekly - 1 sets - 5 reps - 20 hold Education details:  discussed adhesive capsulits,supine wand flexion and scaption Access Code: [email or web address] URL: [email or web address] Date: 08/26/2022 Prepared by: Alvira Monday  Exercises - Standing Shoulder and Trunk Flexion at Table  - 2-3 x daily - 7 x weekly - 1 sets - 5 reps - Supine Shoulder Flexion Extension AAROM with Dowel  - 2-3 x daily - 7 x weekly - 1 sets - 5 reps - Standing Shoulder Internal Rotation Stretch with Towel  - 1 x daily - 7 x weekly - 1 sets - 5 reps - Standing Shoulder Internal Rotation Stretch with Hands Behind Back  - 1 x daily - 7 x weekly - 3 sets - 10 reps Person educated: Patient Education method: Explanation and Handouts Education comprehension: verbalized understanding, returned demonstration, and tactile cues required  HOME EXERCISE PROGRAM: Supine wand flexion and scaption. X 5, towel stretch for IR or use of hand, standing lat stretch Psterior capsule stretch, sleeper stretch, wall pec stretch  ASSESSMENT:  CLINICAL IMPRESSION: Pt continues with  restrictions in all planes with firm end feels despite compliance with HEP    OBJECTIVE IMPAIRMENTS: decreased activity tolerance, decreased knowledge of condition, decreased ROM, impaired flexibility,  impaired UE functional use, postural dysfunction, and pain.   ACTIVITY LIMITATIONS: sleeping, bed mobility, bathing, dressing, reach over head, and hygiene/grooming  PARTICIPATION LIMITATIONS: meal prep, cleaning, driving, and occupation  PERSONAL FACTORS: 1-2 comorbidities: s/p Left Mastectomy with SLNB, chemo, now with adhesive capsulitis  are also affecting patient's functional outcome.   REHAB POTENTIAL: Excellent  CLINICAL DECISION MAKING: Stable/uncomplicated  EVALUATION COMPLEXITY: Low  GOALS: Goals reviewed with patient? Yes  SHORT TERM GOALS: Target date: 09/23/2022  Pt will be independent with HEP for Left shoulder ROM  Baseline: Goal status: MET  2.  Pt will have left shoulder pain decreased to 3/10 or better at rest Baseline:  Goal status:MET 3.  Pt will be able to dress and bathe independently Baseline:  Goal status: MET 4.  Pt will have left shoulder ROM improved by 30-40 degrees for flexion and abd for improved reaching ability Baseline: flex  80     abd 60 Goal status: INITIAL  5  LONG TERM GOALS: Target date: 10/21/2022  Pts left shoulder ROM will be within 10 degrees of right for shoulder flexion and abd for improved reaching ability Baseline:  Goal status: INITIAL  2.  Pt will be able to drive normally without hiking shoulder Baseline:  Goal status: INITIAL  3.  Quick dash will be no greater than 15% Baseline:  Goal status: INITIAL  4.  Pt will be able to sleep on left side for longer periods of time Baseline:  Goal status: MET  09/04/2022  5.  Pts left shoulder pain will be 60% improved overall Baseline:  Goal status: MET PLAN:  PT FREQUENCY: 2x/week  PT DURATION: 8 weeks  PLANNED INTERVENTIONS: Therapeutic exercises, Therapeutic activity, Neuromuscular re-education, Patient/Family education, Self Care, Joint mobilization, Dry Needling, Manual lymph drainage, Manual therapy, and Re-evaluation  PLAN FOR NEXT SESSION: assess response to  DN, Review sleeper stretch, posterior capsule stretch ,DN, STM prn, Review HEP, add ER wand or table, GH and scapular mobs, PROM, CR stretch,update HEP PHYSICAL THERAPY DISCHARGE SUMMARY  Visits from Start of Care: 9  Current functional level related to goals / functional outcomes: Pt achieved 3/4 STG's and 2/5 LTG's     Remaining deficits: Unknown, pt did not return. Pain was greatly improved but ROM was still limited   Education / Equipment: HEP  Patient agrees to discharge. Patient goals were partially met. Patient is being discharged due to not returning since the last visit.  Alvira Monday, PT 10/14/22 5:16 PM

## 2023-07-11 ENCOUNTER — Telehealth: Payer: Self-pay | Admitting: Hematology and Oncology

## 2023-07-11 ENCOUNTER — Other Ambulatory Visit: Payer: Self-pay | Admitting: Hematology and Oncology

## 2023-07-11 NOTE — Telephone Encounter (Signed)
 left vm for pt about scheduled appt date and time

## 2023-09-10 ENCOUNTER — Inpatient Hospital Stay: Attending: Hematology and Oncology | Admitting: Hematology and Oncology

## 2023-09-10 DIAGNOSIS — Z923 Personal history of irradiation: Secondary | ICD-10-CM | POA: Diagnosis not present

## 2023-09-10 DIAGNOSIS — C50412 Malignant neoplasm of upper-outer quadrant of left female breast: Secondary | ICD-10-CM | POA: Diagnosis not present

## 2023-09-10 DIAGNOSIS — Z170 Estrogen receptor positive status [ER+]: Secondary | ICD-10-CM | POA: Insufficient documentation

## 2023-09-10 DIAGNOSIS — Z1722 Progesterone receptor negative status: Secondary | ICD-10-CM | POA: Insufficient documentation

## 2023-09-10 DIAGNOSIS — Z79811 Long term (current) use of aromatase inhibitors: Secondary | ICD-10-CM | POA: Diagnosis not present

## 2023-09-10 DIAGNOSIS — M8580 Other specified disorders of bone density and structure, unspecified site: Secondary | ICD-10-CM | POA: Insufficient documentation

## 2023-09-10 DIAGNOSIS — Z9221 Personal history of antineoplastic chemotherapy: Secondary | ICD-10-CM | POA: Insufficient documentation

## 2023-09-10 DIAGNOSIS — Z79899 Other long term (current) drug therapy: Secondary | ICD-10-CM | POA: Diagnosis not present

## 2023-09-10 DIAGNOSIS — G620 Drug-induced polyneuropathy: Secondary | ICD-10-CM | POA: Diagnosis not present

## 2023-09-10 DIAGNOSIS — Z86000 Personal history of in-situ neoplasm of breast: Secondary | ICD-10-CM | POA: Diagnosis not present

## 2023-09-10 DIAGNOSIS — T451X5A Adverse effect of antineoplastic and immunosuppressive drugs, initial encounter: Secondary | ICD-10-CM | POA: Insufficient documentation

## 2023-09-10 DIAGNOSIS — Z1732 Human epidermal growth factor receptor 2 negative status: Secondary | ICD-10-CM | POA: Diagnosis not present

## 2023-09-10 DIAGNOSIS — N951 Menopausal and female climacteric states: Secondary | ICD-10-CM | POA: Insufficient documentation

## 2023-09-10 MED ORDER — LETROZOLE 2.5 MG PO TABS: 2.5000 mg | ORAL_TABLET | Freq: Every day | ORAL | 3 refills | Status: AC

## 2023-09-10 NOTE — Assessment & Plan Note (Addendum)
 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  Neoadjuvant chemotherapy with dose dense Adriamycin  Cytoxan  followed by Taxol  weekly x6 discontinued because of neuropathy 2. mastectomy with sentinel lymph node biopsy: 07/31/2021: Residual grade 2 IDC 4.1 mm, margins negative, 0/5 lymph nodes negative, ER 40% weak staining, PR negative, HER2 negative, Ki-67 40% 3. Followed by adjuvant antiestrogen therapy ------------------------------------------------------------------------------------------ Chemo-induced peripheral neuropathy: Marked improvement     Letrozole  toxicities: Muscle aches and pains   Breast cancer surveillance: Mammogram 01/28/2023: [facility]: Benign, density cat B Breast exam 09/10/2023: Benign Bone density: 07/15/23: T score -2.4: recommend Bisphosphonate therapy   Left arm decreased range of motion: I will refer the patient to physical therapy. Return to clinic in 1 year for follow-up

## 2023-09-10 NOTE — Progress Notes (Signed)
 Patient Care Team: Alben Therisa MATSU, PA as PCP - General (Family Medicine) Odean Potts, MD as Consulting Physician (Hematology and Oncology) Ebbie Cough, MD as Consulting Physician (General Surgery)  DIAGNOSIS:  Encounter Diagnosis  Name Primary?   Malignant neoplasm of upper-outer quadrant of left breast in female, estrogen receptor positive (HCC) Yes    SUMMARY OF ONCOLOGIC HISTORY: Oncology History  Ductal carcinoma in situ (DCIS) of right breast  01/30/2018 Mammogram   Diagnostic Mammogram  0.4cm cluster of calcifications, 12 o'clock position, 4cm fn, right breast   02/12/2018 Initial Diagnosis   Screening detected right breast calcifications 4 mm size UOQ 11:30 position stereotactic biopsy revealed high-grade DCIS ER 90%, PR 10%, Tis NX stage 0   02/18/2018 Genetic Testing   Negative.  Genes tested include: ATM, BRCA1, BRCA2, CDH1, CHEK2, PALB2, PTEN, STK11 and TP53; APC, ATM, AXIN2, BARD1, BMPR1A, BRCA1, BRCA2, BRIP1, CDH1, CDKN2A (p14ARF), CDKN2A (p16INK4a), CKD4, CHEK2, CTNNA1, DICER1, EPCAM (Deletion/duplication testing only), GREM1 (promoter region deletion/duplication testing only), KIT, MEN1, MLH1, MSH2, MSH3, MSH6, MUTYH, NBN, NF1, NHTL1, PALB2, PDGFRA, PMS2, POLD1, POLE, PTEN, RAD50, RAD51C, RAD51D, SDHB, SDHC, SDHD, SMAD4, SMARCA4. STK11, TP53, TSC1, TSC2, and VHL.  The following genes were evaluated for sequence changes only: SDHA and HOXB13 c.251G>A variant only.   03/20/2018 Surgery   Right lumpectomy: Scattered microscopic foci of DCIS intermediate grade, margins negative, ER 90%, PR 10%, Tis NX stage 0   04/01/2018 Cancer Staging   Staging form: Breast, AJCC 8th Edition - Pathologic: Stage 0 (pTis (DCIS), pN0, cM0, ER+, PR+) - Signed by Crawford Morna Pickle, NP on 04/01/2018   04/28/2018 - 05/26/2018 Radiation Therapy   Adjuvant radiation 1. Right breast; 15 fractions of 2.67 Gy for a total of 40.05 Gy 2. Boost; 5 fractions of 2 Gy for a total of 10 Gy      05/2018 -  Anti-estrogen oral therapy   Tamoxifen  daily   Malignant neoplasm of upper-outer quadrant of left breast in female, estrogen receptor positive (HCC)  01/19/2021 Initial Diagnosis   Left breast biopsy: 2:00 and 4:00: Grade 3 IDC ER 40% weak, PR 0%, Ki-67 40%, HER2 2+ equivocal by IHC, FISH negative Lymph node biopsy: Benign   01/19/2021 Cancer Staging   Staging form: Breast, AJCC 8th Edition - Clinical stage from 01/19/2021: Stage IIIA (cT2, cN1, cM0, G3, ER+, PR-, HER2-) - Signed by Crawford Morna Pickle, NP on 05/09/2021 Stage prefix: Initial diagnosis Histologic grading system: 3 grade system   02/12/2021 Breast MRI   Breast MRI 02/12/2021: 4 suspicious masses within the left breast consistent with multifocal, multicentric disease 2 of the 4 masses have been biopsied (2 cm, 1.3 cm, 1.2 cm, 1.7 cm) for mildly prominent level 1 lymph nodes are identified   03/14/2021 - 06/14/2021 Chemotherapy   Patient is on Treatment Plan : BREAST ADJUVANT DOSE DENSE AC q14d / PACLitaxel  q7d     07/31/2021 Surgery   2. mastectomy with sentinel lymph node biopsy: 07/31/2021: Residual grade 2 IDC 4.1 mm, margins negative, 0/5 lymph nodes negative, ER 40% weak staining, PR negative, HER2 negative, Ki-67 40%    08/2021 -  Anti-estrogen oral therapy   Letrozole      CHIEF COMPLIANT: Follow-up on letrozole  therapy  HISTORY OF PRESENT ILLNESS:   History of Present Illness Jasmine Maddox is a 63 year old female with breast cancer who presents for a follow-up visit.  She takes anastrozole and experiences mild hot flashes and neuropathy, likely related to her medication. A recent  bone density test shows improvement with a T-score change from -2.5 to -2.1. She remains active, walking 10,000 steps daily and incorporating weight training, and continues to take Fosamax.     ALLERGIES:  has no known allergies.  MEDICATIONS:  Current Outpatient Medications  Medication Sig Dispense Refill    alendronate (FOSAMAX) 70 MG tablet Take 70 mg by mouth daily.     lisinopril -hydrochlorothiazide  (ZESTORETIC ) 20-12.5 MG tablet Take 1 tablet by mouth daily.     letrozole  (FEMARA ) 2.5 MG tablet Take 1 tablet (2.5 mg total) by mouth daily. 90 tablet 3   No current facility-administered medications for this visit.    PHYSICAL EXAMINATION: ECOG PERFORMANCE STATUS: 1 - Symptomatic but completely ambulatory  There were no vitals filed for this visit. There were no vitals filed for this visit.    LABORATORY DATA:  I have reviewed the data as listed    Latest Ref Rng & Units 07/20/2021    8:18 AM 06/21/2021   11:07 AM 06/14/2021   11:45 AM  CMP  Glucose 70 - 99 mg/dL 98  877  894   BUN 6 - 20 mg/dL 12  9  10    Creatinine 0.44 - 1.00 mg/dL 9.24  9.35  9.34   Sodium 135 - 145 mmol/L 140  137  137   Potassium 3.5 - 5.1 mmol/L 3.9  3.5  3.7   Chloride 98 - 111 mmol/L 105  103  103   CO2 22 - 32 mmol/L 27  29  28    Calcium 8.9 - 10.3 mg/dL 9.5  9.4  8.8   Total Protein 6.5 - 8.1 g/dL  6.8  7.0   Total Bilirubin 0.3 - 1.2 mg/dL  0.4  0.4   Alkaline Phos 38 - 126 U/L  48  53   AST 15 - 41 U/L  18  29   ALT 0 - 44 U/L  20  25     Lab Results  Component Value Date   WBC 3.5 (L) 06/21/2021   HGB 9.0 (L) 06/21/2021   HCT 27.1 (L) 06/21/2021   MCV 93.8 06/21/2021   PLT 264 06/21/2021   NEUTROABS 2.5 06/21/2021    ASSESSMENT & PLAN:  Malignant neoplasm of upper-outer quadrant of left breast in female, estrogen receptor positive (HCC) 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  Neoadjuvant chemotherapy with dose dense  Adriamycin  Cytoxan  followed by Taxol  weekly x6 discontinued because of neuropathy 2. mastectomy with sentinel lymph node biopsy: 07/31/2021: Residual grade 2 IDC 4.1 mm, margins negative, 0/5 lymph nodes negative, ER 40% weak staining, PR negative, HER2 negative, Ki-67 40% 3. Followed by adjuvant antiestrogen therapy ------------------------------------------------------------------------------------------ Chemo-induced peripheral neuropathy: Marked improvement     Letrozole  toxicities: Muscle aches and pains   Breast cancer surveillance: Mammogram 01/28/2023: [facility]: Benign, density cat B Breast exam 09/10/2023: Benign Bone density: 07/15/23: T score -2.1 (on fosamax)   Return to clinic in 1 year for follow-up ------------------------------------- Assessment and Plan Assessment & Plan History of malignant neoplasm of upper-outer quadrant of left breast, status post treatment, on adjuvant anastrozole Two years post-treatment,  stable on anastrozole with mild, non-bothersome hot flashes. Mammograms unremarkable. - Continue anastrozole therapy. - Send a year's worth of anastrozole refills to the pharmacy.  Chemo-induced peripheral neuropathy Stiffness and curling of fingers likely due to neuropathy, managed with stretching device. - Recommend magnesium supplement 400 mg over the counter. - Suggest using Voltaren  gel for finger pain.  Osteopenia on bisphosphonate therapy Bone density improved from -2.5 to -2.1 with physical activity and Fosamax. No additional medications needed. - Continue Fosamax therapy. - Reassess bone density in two years.      No orders of the defined types were placed in this encounter.  The patient has a good understanding of the overall plan. she agrees with it. she will call with any problems that may develop before the next visit here. Total time spent: 30 mins including face to face time and time spent for planning and co-ordination of care   Viinay K  Laquentin Loudermilk, MD 09/10/23

## 2023-09-12 ENCOUNTER — Encounter: Payer: Self-pay | Admitting: Radiology

## 2023-11-03 ENCOUNTER — Other Ambulatory Visit: Payer: Self-pay

## 2023-11-03 DIAGNOSIS — N816 Rectocele: Secondary | ICD-10-CM

## 2024-09-09 ENCOUNTER — Ambulatory Visit: Admitting: Hematology and Oncology
